# Patient Record
Sex: Male | Born: 1945
Health system: Southern US, Community
[De-identification: ages and names within clinical notes are randomized; demographics above are authoritative.]

## PROBLEM LIST (undated history)

## (undated) DIAGNOSIS — I10 Essential (primary) hypertension: Secondary | ICD-10-CM

## (undated) DIAGNOSIS — M1990 Unspecified osteoarthritis, unspecified site: Secondary | ICD-10-CM

## (undated) DIAGNOSIS — F4320 Adjustment disorder, unspecified: Secondary | ICD-10-CM

## (undated) DIAGNOSIS — E669 Obesity, unspecified: Secondary | ICD-10-CM

## (undated) DIAGNOSIS — I82409 Acute embolism and thrombosis of unspecified deep veins of unspecified lower extremity: Secondary | ICD-10-CM

## (undated) DIAGNOSIS — Z950 Presence of cardiac pacemaker: Secondary | ICD-10-CM

## (undated) DIAGNOSIS — F411 Generalized anxiety disorder: Secondary | ICD-10-CM

## (undated) DIAGNOSIS — K644 Residual hemorrhoidal skin tags: Secondary | ICD-10-CM

## (undated) DIAGNOSIS — R001 Bradycardia, unspecified: Secondary | ICD-10-CM

## (undated) DIAGNOSIS — I452 Bifascicular block: Secondary | ICD-10-CM

## (undated) HISTORY — DX: Bifascicular block: I45.2

## (undated) HISTORY — DX: Unspecified osteoarthritis, unspecified site: M19.90

## (undated) HISTORY — DX: Obesity, unspecified: E66.9

## (undated) HISTORY — DX: Essential (primary) hypertension: I10

## (undated) HISTORY — DX: Presence of cardiac pacemaker: Z95.0

## (undated) HISTORY — DX: Generalized anxiety disorder: F41.1

## (undated) HISTORY — DX: Bradycardia, unspecified: R00.1

## (undated) HISTORY — DX: Acute embolism and thrombosis of unspecified deep veins of unspecified lower extremity: I82.409

## (undated) HISTORY — DX: Adjustment disorder, unspecified: F43.20

## (undated) HISTORY — DX: Residual hemorrhoidal skin tags: K64.4

---

## 1998-09-16 ENCOUNTER — Encounter: Payer: Self-pay | Admitting: Emergency Medicine

## 1998-09-16 ENCOUNTER — Emergency Department (HOSPITAL_COMMUNITY): Admission: EM | Admit: 1998-09-16 | Discharge: 1998-09-16 | Payer: Self-pay | Admitting: Emergency Medicine

## 1998-10-24 ENCOUNTER — Encounter: Admission: RE | Admit: 1998-10-24 | Discharge: 1998-10-24 | Payer: Self-pay | Admitting: Sports Medicine

## 2000-04-22 ENCOUNTER — Encounter: Admission: RE | Admit: 2000-04-22 | Discharge: 2000-04-22 | Payer: Self-pay | Admitting: Family Medicine

## 2002-05-11 ENCOUNTER — Encounter: Admission: RE | Admit: 2002-05-11 | Discharge: 2002-05-11 | Payer: Self-pay | Admitting: Sports Medicine

## 2002-05-11 ENCOUNTER — Ambulatory Visit (HOSPITAL_COMMUNITY): Admission: RE | Admit: 2002-05-11 | Discharge: 2002-05-11 | Payer: Self-pay | Admitting: Family Medicine

## 2002-06-11 ENCOUNTER — Encounter: Admission: RE | Admit: 2002-06-11 | Discharge: 2002-06-11 | Payer: Self-pay | Admitting: Family Medicine

## 2002-09-20 ENCOUNTER — Encounter: Admission: RE | Admit: 2002-09-20 | Discharge: 2002-09-20 | Payer: Self-pay | Admitting: Family Medicine

## 2003-02-07 ENCOUNTER — Encounter: Admission: RE | Admit: 2003-02-07 | Discharge: 2003-02-07 | Payer: Self-pay | Admitting: Family Medicine

## 2003-08-19 ENCOUNTER — Encounter: Admission: RE | Admit: 2003-08-19 | Discharge: 2003-08-19 | Payer: Self-pay | Admitting: Family Medicine

## 2004-03-07 ENCOUNTER — Encounter: Admission: RE | Admit: 2004-03-07 | Discharge: 2004-03-07 | Payer: Self-pay | Admitting: Family Medicine

## 2004-03-29 ENCOUNTER — Emergency Department (HOSPITAL_COMMUNITY): Admission: EM | Admit: 2004-03-29 | Discharge: 2004-03-29 | Payer: Self-pay | Admitting: Emergency Medicine

## 2004-04-05 ENCOUNTER — Encounter: Admission: RE | Admit: 2004-04-05 | Discharge: 2004-04-05 | Payer: Self-pay | Admitting: Family Medicine

## 2004-06-05 ENCOUNTER — Ambulatory Visit: Payer: Self-pay | Admitting: Family Medicine

## 2004-07-30 ENCOUNTER — Ambulatory Visit: Payer: Self-pay | Admitting: Family Medicine

## 2004-09-06 ENCOUNTER — Ambulatory Visit: Payer: Self-pay | Admitting: Family Medicine

## 2005-04-03 ENCOUNTER — Ambulatory Visit: Payer: Self-pay | Admitting: Family Medicine

## 2005-04-25 ENCOUNTER — Ambulatory Visit: Payer: Self-pay | Admitting: Family Medicine

## 2005-10-11 ENCOUNTER — Ambulatory Visit: Payer: Self-pay | Admitting: Sports Medicine

## 2005-10-25 ENCOUNTER — Ambulatory Visit: Payer: Self-pay | Admitting: Family Medicine

## 2005-11-05 ENCOUNTER — Ambulatory Visit: Payer: Self-pay | Admitting: Family Medicine

## 2006-05-15 ENCOUNTER — Ambulatory Visit: Payer: Self-pay | Admitting: Family Medicine

## 2006-06-10 ENCOUNTER — Ambulatory Visit: Payer: Self-pay | Admitting: Family Medicine

## 2006-08-19 ENCOUNTER — Ambulatory Visit: Payer: Self-pay | Admitting: Family Medicine

## 2006-11-07 ENCOUNTER — Ambulatory Visit: Payer: Self-pay | Admitting: Family Medicine

## 2006-11-13 DIAGNOSIS — M1990 Unspecified osteoarthritis, unspecified site: Secondary | ICD-10-CM | POA: Insufficient documentation

## 2006-11-13 DIAGNOSIS — N529 Male erectile dysfunction, unspecified: Secondary | ICD-10-CM

## 2006-11-13 DIAGNOSIS — M109 Gout, unspecified: Secondary | ICD-10-CM | POA: Insufficient documentation

## 2006-11-13 DIAGNOSIS — I10 Essential (primary) hypertension: Secondary | ICD-10-CM

## 2006-11-13 DIAGNOSIS — E669 Obesity, unspecified: Secondary | ICD-10-CM

## 2006-11-13 DIAGNOSIS — F411 Generalized anxiety disorder: Secondary | ICD-10-CM | POA: Insufficient documentation

## 2006-11-17 ENCOUNTER — Emergency Department (HOSPITAL_COMMUNITY): Admission: EM | Admit: 2006-11-17 | Discharge: 2006-11-17 | Payer: Self-pay | Admitting: Emergency Medicine

## 2007-03-27 ENCOUNTER — Ambulatory Visit: Payer: Self-pay | Admitting: Family Medicine

## 2007-03-27 ENCOUNTER — Encounter (INDEPENDENT_AMBULATORY_CARE_PROVIDER_SITE_OTHER): Payer: Self-pay | Admitting: Family Medicine

## 2007-03-27 DIAGNOSIS — R011 Cardiac murmur, unspecified: Secondary | ICD-10-CM

## 2007-03-27 LAB — CONVERTED CEMR LAB
BUN: 16 mg/dL (ref 6–23)
CO2: 26 meq/L (ref 19–32)
Calcium: 9.3 mg/dL (ref 8.4–10.5)
Chloride: 107 meq/L (ref 96–112)
Creatinine, Ser: 1.39 mg/dL (ref 0.40–1.50)
Glucose, Bld: 112 mg/dL — ABNORMAL HIGH (ref 70–99)
Potassium: 3.7 meq/L (ref 3.5–5.3)
Sodium: 144 meq/L (ref 135–145)

## 2007-04-08 ENCOUNTER — Ambulatory Visit (HOSPITAL_COMMUNITY): Admission: RE | Admit: 2007-04-08 | Discharge: 2007-04-08 | Payer: Self-pay | Admitting: Family Medicine

## 2007-04-08 ENCOUNTER — Encounter: Payer: Self-pay | Admitting: Family Medicine

## 2007-04-08 ENCOUNTER — Ambulatory Visit: Payer: Self-pay | Admitting: Internal Medicine

## 2007-04-08 ENCOUNTER — Encounter (INDEPENDENT_AMBULATORY_CARE_PROVIDER_SITE_OTHER): Payer: Self-pay | Admitting: Family Medicine

## 2007-08-19 ENCOUNTER — Encounter (INDEPENDENT_AMBULATORY_CARE_PROVIDER_SITE_OTHER): Payer: Self-pay | Admitting: Family Medicine

## 2007-08-19 ENCOUNTER — Ambulatory Visit: Payer: Self-pay | Admitting: Family Medicine

## 2007-10-13 ENCOUNTER — Encounter: Payer: Self-pay | Admitting: *Deleted

## 2007-12-21 ENCOUNTER — Telehealth (INDEPENDENT_AMBULATORY_CARE_PROVIDER_SITE_OTHER): Payer: Self-pay | Admitting: Family Medicine

## 2008-06-29 ENCOUNTER — Encounter: Payer: Self-pay | Admitting: *Deleted

## 2008-07-26 ENCOUNTER — Encounter (INDEPENDENT_AMBULATORY_CARE_PROVIDER_SITE_OTHER): Payer: Self-pay | Admitting: Family Medicine

## 2008-07-26 ENCOUNTER — Ambulatory Visit: Payer: Self-pay | Admitting: Family Medicine

## 2008-07-27 ENCOUNTER — Telehealth (INDEPENDENT_AMBULATORY_CARE_PROVIDER_SITE_OTHER): Payer: Self-pay | Admitting: Family Medicine

## 2008-08-02 ENCOUNTER — Encounter (INDEPENDENT_AMBULATORY_CARE_PROVIDER_SITE_OTHER): Payer: Self-pay | Admitting: Family Medicine

## 2008-08-02 LAB — CONVERTED CEMR LAB
ALT: 13 units/L (ref 0–53)
AST: 16 units/L (ref 0–37)
Albumin: 4.5 g/dL (ref 3.5–5.2)
Alkaline Phosphatase: 51 units/L (ref 39–117)
HDL: 34 mg/dL — ABNORMAL LOW (ref >39)
LDL Cholesterol: 137 mg/dL — ABNORMAL HIGH (ref 0–99)
Potassium: 3.7 meq/L (ref 3.5–5.3)
Sodium: 142 meq/L (ref 135–145)
Total Protein: 8.2 g/dL (ref 6.0–8.3)

## 2009-01-18 ENCOUNTER — Encounter: Payer: Self-pay | Admitting: *Deleted

## 2009-01-23 ENCOUNTER — Emergency Department (HOSPITAL_COMMUNITY): Admission: EM | Admit: 2009-01-23 | Discharge: 2009-01-23 | Payer: Self-pay | Admitting: Emergency Medicine

## 2009-01-26 ENCOUNTER — Ambulatory Visit: Payer: Self-pay | Admitting: Family Medicine

## 2009-01-26 ENCOUNTER — Encounter (INDEPENDENT_AMBULATORY_CARE_PROVIDER_SITE_OTHER): Payer: Self-pay | Admitting: Family Medicine

## 2009-01-26 LAB — CONVERTED CEMR LAB
CO2: 29 meq/L (ref 19–32)
Calcium: 9 mg/dL (ref 8.4–10.5)
Chloride: 107 meq/L (ref 96–112)
Potassium: 4 meq/L (ref 3.5–5.3)
Sodium: 144 meq/L (ref 135–145)

## 2009-01-27 ENCOUNTER — Encounter (INDEPENDENT_AMBULATORY_CARE_PROVIDER_SITE_OTHER): Payer: Self-pay | Admitting: Family Medicine

## 2009-02-03 ENCOUNTER — Telehealth (INDEPENDENT_AMBULATORY_CARE_PROVIDER_SITE_OTHER): Payer: Self-pay | Admitting: Family Medicine

## 2009-02-07 ENCOUNTER — Telehealth: Payer: Self-pay | Admitting: *Deleted

## 2009-03-28 ENCOUNTER — Ambulatory Visit: Payer: Self-pay | Admitting: Family Medicine

## 2009-05-02 ENCOUNTER — Ambulatory Visit: Payer: Self-pay | Admitting: Family Medicine

## 2009-08-01 ENCOUNTER — Telehealth: Payer: Self-pay | Admitting: Family Medicine

## 2009-11-22 ENCOUNTER — Ambulatory Visit: Payer: Self-pay | Admitting: Family Medicine

## 2009-11-22 DIAGNOSIS — F4320 Adjustment disorder, unspecified: Secondary | ICD-10-CM | POA: Insufficient documentation

## 2010-01-29 ENCOUNTER — Telehealth: Payer: Self-pay | Admitting: Family Medicine

## 2010-01-30 ENCOUNTER — Ambulatory Visit: Payer: Self-pay | Admitting: Family Medicine

## 2010-02-19 ENCOUNTER — Ambulatory Visit: Payer: Self-pay | Admitting: Family Medicine

## 2010-02-19 ENCOUNTER — Encounter: Payer: Self-pay | Admitting: Family Medicine

## 2010-02-19 LAB — CONVERTED CEMR LAB
Cholesterol: 150 mg/dL (ref 0–200)
Glucose, Bld: 80 mg/dL (ref 70–99)
Potassium: 4.2 meq/L (ref 3.5–5.3)
Sodium: 142 meq/L (ref 135–145)
Total CHOL/HDL Ratio: 4.3
VLDL: 12 mg/dL (ref 0–40)

## 2010-02-22 ENCOUNTER — Encounter: Payer: Self-pay | Admitting: Family Medicine

## 2010-03-28 ENCOUNTER — Encounter: Payer: Self-pay | Admitting: Family Medicine

## 2010-04-18 ENCOUNTER — Telehealth: Payer: Self-pay | Admitting: Family Medicine

## 2010-04-19 ENCOUNTER — Ambulatory Visit: Payer: Self-pay | Admitting: Family Medicine

## 2010-04-19 DIAGNOSIS — K644 Residual hemorrhoidal skin tags: Secondary | ICD-10-CM | POA: Insufficient documentation

## 2010-07-20 ENCOUNTER — Encounter: Payer: Self-pay | Admitting: Family Medicine

## 2010-08-01 ENCOUNTER — Ambulatory Visit: Payer: Self-pay | Admitting: Family Medicine

## 2010-10-09 ENCOUNTER — Encounter (INDEPENDENT_AMBULATORY_CARE_PROVIDER_SITE_OTHER): Payer: Self-pay | Admitting: *Deleted

## 2010-10-16 NOTE — Letter (Signed)
Summary: Generic Letter  [facility]  [address]   [place], [place] [address]   Phone: [phone]  Fax: [phone]    02/22/2010  [facility] [address] [place], [place]  [address]  Dear [name],  Here is a copy of your lab results.  Your kidney function and cholesterol levels were fine.  No medications are needed at this point.  Tests: (1) Basic Metabolic Panel (57846)   Order Note: FASTING   Sodium                    142 mEq/L                   135-145   Potassium                 4.2 mEq/L                   3.5-5.3   Chloride                  106 mEq/L                   96-112   CO2                       27 mEq/L                    19-32   Glucose                   80 mg/dL                    96-29   BUN                       14 mg/dL                    5-28   Creatinine                1.15 mg/dL                  0.40-1.50   Calcium                   9.3 mg/dL                   4.1-32.4  Tests: (2) Lipid Profile (40102)   Cholesterol               150 mg/dL                   7-253     ATP III Classification:           < 200        mg/dL        Desirable          200 - 239     mg/dL        Borderline High          >= 240        mg/dL        High         Triglyceride              60 mg/dL                    <664  HDL Cholesterol      [L]  35 mg/dL                    >09   Total Chol/HDL Ratio      4.3 Ratio  VLDL Cholesterol (Calc)                             12 mg/dL                    8-11  LDL Cholesterol (Calc)                        [H]  103 mg/dL                   9-14           Total Cholesterol/HDL Ratio:CHD Risk                            Coronary Heart Disease Risk Table                                            Men       Women              1/2 Average Risk              3.4        3.3                  Average Risk              5.0        4.4              2 X Average Risk              9.6        7.1              3 X Average  Risk             23.4       11.0     Use the calculated Patient Ratio above and the CHD Risk table      to determine the patient's CHD Risk.     ATP III Classification (LDL):           < 100        mg/dL         Optimal          100 - 129     mg/dL         Near or Above Optimal          130 - 159     mg/dL         Borderline High          160 - 189     mg/dL         High           > 190        mg/dL         Very High  Sincerely,   Angelena Sole MD  Appended Document: Generic Letter mailed.

## 2010-10-16 NOTE — Assessment & Plan Note (Signed)
Summary: f/up,tcb   Vital Signs:  Patient profile:   65 year old male Height:      68.5 inches Weight:      198 pounds BMI:     29.78 BSA:     2.05 Temp:     98.8 degrees F Pulse rate:   58 / minute BP sitting:   149 / 78  Vitals Entered By: Jone Baseman CMA (November 22, 2009 1:49 PM)  Serial Vital Signs/Assessments:  Time      Position  BP       Pulse  Resp  Temp     By                     125/80                         Angelena Sole MD  CC: HTN Is Patient Diabetic? No Pain Assessment Patient in pain? no        CC:  HTN.  History of Present Illness: 1. HTN: Pt is taking his medicines as prescribed.  He does not check his blood pressure at home regularly.  Occassionally it will get checked and he says that it is always normal.        ROS:  He denies any chest pain, headache, shortness of breath  2. Adjustment disorder: Pt just found out that his brother died suddenly yesterday.  He was close with his brother.  Overall he doesn't feel depressed just trying to cope with the news.      ROS: denies SI  3. Gout:  Doing much better.  Staying away from cured meats and salty foods.      ROS: Denies joint swelling or redness.  Habits & Providers  Alcohol-Tobacco-Diet     Tobacco Status: never  Current Medications (verified): 1)  Cialis 20 Mg Tabs (Tadalafil) .... Take 1 Tab Daily As Needed 2)  Aspirin Ec 81 Mg Tbec (Aspirin) .... Take 1 Tablet By Mouth Once A Day 3)  Indomethacin 50 Mg Caps (Indomethacin) 4)  Lisinopril-Hydrochlorothiazide 20-12.5 Mg Tabs (Lisinopril-Hydrochlorothiazide) .Marland Kitchen.. 1 Tablet By Mouth Twice A Day 5)  Norvasc 10 Mg Tabs (Amlodipine Besylate) .... Take 1 Tablet By Mouth Once A Day 6)  Toprol Xl 50 Mg Xr24h-Tab (Metoprolol Succinate) .... One By Mouth Daily 7)  Colchicine 0.6 Mg Tabs (Colchicine) .... 2 Tablets On The Day of A Flare Then Two Times A Day Until Symptoms Resolve  Allergies: No Known Drug Allergies  Family History: Reviewed  history from 03/27/2007 and no changes required. Father died at 59 unk causes Brother died from PE? in 01/31/2010 HTN runs in family Mother- arrythmia with pacemaker, living at 57 No DM, CVA,  prostate cancer  Social History: Reviewed history from 01/26/2009 and no changes required. Married, 5 grown children (one boy was in the army, 4 girls), 11 grandchildren.  Works at [employer] - laid off as of Jan 2009.  Denies tobacco or illicit drugs ever.  Rare alcohol usage.  Enjoys fishing. Runs the step in his house with his grandson.   Physical Exam  General:  vitals reviewed and rechecked.  alert, well-nourished, and well-hydrated.   Eyes:  Fundoscopic exam benign Neck:  No JVD Lungs:  normal respiratory effort, normal breath sounds, and no wheezes.   Heart:  normal rate, regular rhythm, and no murmur.   Extremities:  no lower extremity edema Psych:  Mildly depressed appearing.  Good eye contact.  Full affect.   Impression & Recommendations:  Problem # 1:  HYPERTENSION, BENIGN SYSTEMIC (ICD-401.1) Assessment Unchanged  At goal with recheck.  Will not make any changes at this time.  At next visit will check lipids and BMET. His updated medication list for this problem includes:    Lisinopril-hydrochlorothiazide 20-12.5 Mg Tabs (Lisinopril-hydrochlorothiazide) .Marland Kitchen... 1 tablet by mouth twice a day    Norvasc 10 Mg Tabs (Amlodipine besylate) .Marland Kitchen... Take 1 tablet by mouth once a day    Toprol Xl 50 Mg Xr24h-tab (Metoprolol succinate) ..... One by mouth daily  Orders: [facility]- Est  Level 4 (99214)  Problem # 2:  ADJUSTMENT DISORDER WITHOUT DEPRESSED MOOD (ICD-309.9) Assessment: New  Does not seem depressed.  Thinks that he just needs some time to adjust.  Orders: [facility]- Est  Level 4 (81191)  Problem # 3:  GOUT, UNSPECIFIED (ICD-274.9) Assessment: Improved  His updated medication list for this problem includes:    Indomethacin 50 Mg Caps (Indomethacin)    Colchicine 0.6 Mg Tabs (Colchicine)  .Marland Kitchen... 2 tablets on the day of a flare then two times a day until symptoms resolve  Orders: [facility]- Est  Level 4 (47829)  Complete Medication List: 1)  Cialis 20 Mg Tabs (Tadalafil) .... Take 1 tab daily as needed 2)  Aspirin Ec 81 Mg Tbec (Aspirin) .... Take 1 tablet by mouth once a day 3)  Indomethacin 50 Mg Caps (Indomethacin) 4)  Lisinopril-hydrochlorothiazide 20-12.5 Mg Tabs (Lisinopril-hydrochlorothiazide) .Marland Kitchen.. 1 tablet by mouth twice a day 5)  Norvasc 10 Mg Tabs (Amlodipine besylate) .... Take 1 tablet by mouth once a day 6)  Toprol Xl 50 Mg Xr24h-tab (Metoprolol succinate) .... One by mouth daily 7)  Colchicine 0.6 Mg Tabs (Colchicine) .... 2 tablets on the day of a flare then two times a day until symptoms resolve  Other Orders: Pneumococcal Vaccine (56213) Admin 1st Vaccine (08657) Admin 1st Vaccine [facility]) [phone])  Patient Instructions: 1)  I'm sorry for your loss 2)  I think that you are otherwise doing great 3)  Your blood pressure was better after I rechecked it 4)  I would like to get some blood work with your next visit.  Including your cholesterol and kidney function. 5)  Please schedule a follow up visit in 3 months.  Don't eat anything for 8 hours prior to your visit so we can get the blood work done. 6)  Please let me know if you start feeling depressed and need to talk with someone  Prevention & Chronic Care Immunizations   Influenza vaccine: clinic ran out  (07/26/2008)   Influenza vaccine due: 07/26/2009    Tetanus booster: 10/17/1998: Done.   Tetanus booster due: 10/17/2008    Pneumococcal vaccine: Pneumovax  (11/22/2009)   Pneumococcal vaccine due: None    H. zoster vaccine: Not documented  Colorectal Screening   Hemoccult: Done.  (05/21/2004)   Hemoccult due: Not Indicated    Colonoscopy: Done.  (07/22/2005)   Colonoscopy due: 07/23/2015  Other Screening   PSA: 0.78  (01/26/2009)   Smoking status: never  (11/22/2009)  Lipids   Total  Cholesterol: 191  (07/26/2008)   LDL: 137  (07/26/2008)   LDL Direct: 103  (01/26/2009)   HDL: 34  (07/26/2008)   Triglycerides: 101  (07/26/2008)  Hypertension   Last Blood Pressure: 149 / 78  (11/22/2009)   Serum creatinine: 1.36  (01/26/2009)   Serum potassium 4.0  (01/26/2009)    Hypertension flowsheet reviewed?: Yes  Progress toward BP goal: Unchanged  Self-Management Support :    Hypertension self-management support: Not documented   Nursing Instructions: Give Pneumovax today    Pneumovax Vaccine    Vaccine Type: Pneumovax    Site: left deltoid    Mfr: Merck    Dose: 0.5 ml    Route: IM    Given by: Garen Grams LPN    Exp. Date: 04/30/2011    Lot #: [id number]    VIS given: 04/13/96 version given November 22, 2009.

## 2010-10-16 NOTE — Progress Notes (Signed)
Summary: triage  Phone Note Call from Patient Call back at Home Phone [phone]   Caller: Patient Summary of Call: needs to talk to nurse about hemmorhoids Initial call taken by: De Nurse,  April 18, 2010 3:03 PM  Follow-up for Phone Call        says he has soft bm & is using the prep H but they are hurting. appt with pcp made for tomorrow Follow-up by: Golden Circle RN,  April 18, 2010 3:06 PM

## 2010-10-16 NOTE — Assessment & Plan Note (Signed)
Summary: f/u,df   Vital Signs:  Patient profile:   65 year old male Height:      68.5 inches Weight:      193 pounds BMI:     29.02 Temp:     98.9 degrees F oral Pulse rate:   54 / minute BP sitting:   119 / 73  (left arm) Cuff size:   regular  Vitals Entered By: Garen Grams LPN (August 01, 2010 3:57 PM) CC: f/u bp Is Patient Diabetic? No Pain Assessment Patient in pain? no        Primary Care Provider:  Angelena Sole MD  CC:  f/u bp.  History of Present Illness: 1. HTN:  Pt is taking his blood pressure medicines as prescribed.  He doesn't check his blood pressure at home regularly.  ROS: denies chest pain, shortness of breath, vision changes  2. Hemorrhoids: Are not bothering him anymore  3. Gout:  Not taking any preventive medications.  Hasn't had any joint pain in months.  Habits & Providers  Alcohol-Tobacco-Diet     Tobacco Status: never  Current Medications (verified): 1)  Cialis 20 Mg Tabs (Tadalafil) .... Take 1 Tab Daily As Needed 2)  Aspirin Ec 81 Mg Tbec (Aspirin) .... Take 1 Tablet By Mouth Once A Day 3)  Lisinopril-Hydrochlorothiazide 20-12.5 Mg Tabs (Lisinopril-Hydrochlorothiazide) .Marland Kitchen.. 1 Tablet By Mouth Twice A Day 4)  Norvasc 10 Mg Tabs (Amlodipine Besylate) .... Take 1 Tablet By Mouth Once A Day 5)  Toprol Xl 50 Mg Xr24h-Tab (Metoprolol Succinate) .... One By Mouth Daily 6)  Colcrys 0.6 Mg Tabs (Colchicine) .... Take Two Tabs By Mouth Two Times A Day For Gout Flare  Allergies: No Known Drug Allergies  Past History:  Past Medical History: Reviewed history from 08/19/2007 and no changes required. FOB negative 11/14 2-D Echo - Mod LVH, EF 75% - 10/18/1995, normal EF 03/2007 with continued mild LVH ETT - elev. BP & equivocal ST changes - 10/18/1995 ANA negative - 10/17/1998, ESR 8 - 10/17/1998, RA <20 - 10/17/1998, uric acid 6.3 - 10/17/1998  Social History: Reviewed history from 01/26/2009 and no changes required. Married, 5 grown children (one boy  was in the army, 4 girls), 11 grandchildren.  Works at [employer] - laid off as of Jan 2009.  Denies tobacco or illicit drugs ever.  Rare alcohol usage.  Enjoys fishing. Runs the step in his house with his grandson.   Physical Exam  General:  vitals reviewed.  no acute distress Neck:  supple.  no JVD Lungs:  normal respiratory effort, normal breath sounds, and no wheezes.   Heart:  normal rate, regular rhythm, and no murmur.   Msk:  no joint tenderness and no joint swelling.   Extremities:  no LE edema Neurologic:  alert & oriented X3.   Psych:  not anxious appearing and not depressed appearing.     Impression & Recommendations:  Problem # 1:  HYPERTENSION, BENIGN SYSTEMIC (ICD-401.1) Assessment Unchanged  At goal.  Continue with current medications. His updated medication list for this problem includes:    Lisinopril-hydrochlorothiazide 20-12.5 Mg Tabs (Lisinopril-hydrochlorothiazide) .Marland Kitchen... 1 tablet by mouth twice a day    Norvasc 10 Mg Tabs (Amlodipine besylate) .Marland Kitchen... Take 1 tablet by mouth once a day    Toprol Xl 50 Mg Xr24h-tab (Metoprolol succinate) ..... One by mouth daily  Orders: [facility]- Est  Level 4 (04540)  Problem # 2:  EXTERNAL HEMORRHOIDS (ICD-455.3) Assessment: Improved  Resolved.   Orders: [facility]- Est  Level 4 (99214)  Problem # 3:  GOUT, UNSPECIFIED (ICD-274.9) Assessment: Improved  Doing well.  No active joints.  Continue Colcrys as needed. The following medications were removed from the medication list:    Indomethacin 50 Mg Caps (Indomethacin) His updated medication list for this problem includes:    Colcrys 0.6 Mg Tabs (Colchicine) .Marland Kitchen... Take two tabs by mouth two times a day for gout flare  Orders: [facility]- Est  Level 4 (19147)  Complete Medication List: 1)  Cialis 20 Mg Tabs (Tadalafil) .... Take 1 tab daily as needed 2)  Aspirin Ec 81 Mg Tbec (Aspirin) .... Take 1 tablet by mouth once a day 3)  Lisinopril-hydrochlorothiazide 20-12.5 Mg Tabs  (Lisinopril-hydrochlorothiazide) .Marland Kitchen.. 1 tablet by mouth twice a day 4)  Norvasc 10 Mg Tabs (Amlodipine besylate) .... Take 1 tablet by mouth once a day 5)  Toprol Xl 50 Mg Xr24h-tab (Metoprolol succinate) .... One by mouth daily 6)  Colcrys 0.6 Mg Tabs (Colchicine) .... Take two tabs by mouth two times a day for gout flare  Patient Instructions: 1)  I'm glad that you are doing well 2)  Keep up the good work with the blood pressure 3)  Please schedule a follow up appointment with me in 6 months   Orders Added: 1)  [facility]- Est  Level 4 [82956]  Appended Document: f/u,df    Clinical Lists Changes  Orders: Added new Service order of Flu Vaccine 29yrs + [phone]) - Signed Added new Service order of Admin 1st Vaccine (65784) - Signed Observations: Added new observation of FLU VAX#1VIS: 04/10/10 version given August 02, 2010. (08/01/2010 17:16) Added new observation of FLU VAXLOT: [id number] (08/01/2010 17:16) Added new observation of FLU VAX EXP: 03/16/2011 (08/01/2010 17:16) Added new observation of FLU VAXBY: Jessica Fleeger CMA (08/01/2010 17:16) Added new observation of FLU VAXRTE: IM (08/01/2010 17:16) Added new observation of FLU VAX DSE: 0.5 ml (08/01/2010 17:16) Added new observation of FLU VAXMFR: GlaxoSmithKline (08/01/2010 17:16) Added new observation of FLU VAX SITE: left deltoid (08/01/2010 17:16) Added new observation of FLU VAX: Fluvax 3+ (08/01/2010 17:16)       Immunizations Administered:  Influenza Vaccine # 1:    Vaccine Type: Fluvax 3+    Site: left deltoid    Mfr: GlaxoSmithKline    Dose: 0.5 ml    Route: IM    Given by: Jone Baseman CMA    Exp. Date: 03/16/2011    Lot #: [id number]    VIS given: 04/10/10 version given August 02, 2010.  Flu Vaccine Consent Questions:    Do you have a history of severe allergic reactions to this vaccine? no    Any prior history of allergic reactions to egg and/or gelatin? no    Do you have a sensitivity to the  preservative Thimersol? no    Do you have a past history of Guillan-Barre Syndrome? no    Do you currently have an acute febrile illness? no    Have you ever had a severe reaction to latex? no    Vaccine information given and explained to patient? yes

## 2010-10-16 NOTE — Miscellaneous (Signed)
  Clinical Lists Changes  Medications: Added new medication of COLCRYS 0.6 MG TABS (COLCHICINE) Take two tabs by mouth two times a day for gout flare Removed medication of COLCHICINE 0.6 MG TABS (COLCHICINE) 2 tablets on the day of a flare then two times a day until symptoms resolve

## 2010-10-16 NOTE — Progress Notes (Signed)
Summary: triage  Phone Note Call from Patient Call back at [phone]   Caller: Patient Summary of Call: having hemmorrhoid problems and wants to know what to do Initial call taken by: De Nurse,  Jan 29, 2010 2:55 PM  Follow-up for Phone Call        used prep H which helped some. denies constipation. wants to be seen. appt tomorrow at 11 work in. aware there may be a wait. this was the best time for him Follow-up by: Golden Circle RN,  Jan 29, 2010 3:05 PM

## 2010-10-16 NOTE — Assessment & Plan Note (Signed)
Summary: hemmorhoids/Mayo/saunders   Vital Signs:  Patient profile:   65 year old male Height:      68.5 inches Weight:      193.2 pounds BMI:     29.05 Temp:     98.6 degrees F oral Pulse rate:   110 / minute BP sitting:   164 / 79  (left arm) Cuff size:   regular  Vitals Entered By: Garen Grams LPN (Jan 30, 2010 11:05 AM) CC: hemmorhoids Is Patient Diabetic? No Pain Assessment Patient in pain? no        Primary Care Provider:  Angelena Sole MD  CC:  hemmorhoids.  History of Present Illness: 65 yo male with 1 week of anal pruritus--worse with stooling.  No prior episode of this.  Denies blood in stool, constipation, diarrhea.  Reports black stools once a month after he takes Ex-Lax.  He does not get constipated, but takes Ex-Lax once a month for a clean out as recommended by his mother--this is the only time he has black stools.  Was advised by family member to get some Preparation H, but he is not sure what that is and has usedno OTC medications for this.  Noted to be hypertensive today.  Taking medications as prescribed, though he cannot name them today.  He denies chest pain, dyspnea, LE edema.  Tobacco status noted.  Habits & Providers  Alcohol-Tobacco-Diet     Tobacco Status: never  Allergies (verified): No Known Drug Allergies  Review of Systems       Per HPI.  Physical Exam  Additional Exam:  VITALS:  Reviewed, hypertensive GEN: Alert & oriented, no acute distress CARDIO: Regular rate and rhythm, no murmurs/rubs/gallops, 2+ bilateral radial pulses RESP: Clear to auscultation, normal work of breathing, no retractions/accessory muscle use ABD: Normoactive bowel sounds, nontender, no masses/hepatosplenomegaly EXT: Nontender, no edema RECTUM:  Skin tag, no external hemorrhoid or fissure noted on external exam, DRE limited by pain--rectum not examined.  Unable to perform FOBT.   Impression & Recommendations:  Problem # 1:  ANAL OR RECTAL PAIN  (ZOX-096.04) Assessment New Symptoms consistent with hemorrhoids.  Intermittent black stools noted, though patient states this is chronic and only when taking Ex-Lax--would attempt to repeat FOBT at next exam.  Will treat presumptively with Preparation H + Hydrocortisone, sitz baths.  Follow up in 1-2 weeks.  Orders: [facility]- Est  Level 4 (54098)  Problem # 2:  HYPERTENSION, BENIGN SYSTEMIC (ICD-401.1) Assessment: Deteriorated  Asymptomatic.  Possibly secondary to anal pain.  Advised f/u with PCP 1-2 weeks. His updated medication list for this problem includes:    Lisinopril-hydrochlorothiazide 20-12.5 Mg Tabs (Lisinopril-hydrochlorothiazide) .Marland Kitchen... 1 tablet by mouth twice a day    Norvasc 10 Mg Tabs (Amlodipine besylate) .Marland Kitchen... Take 1 tablet by mouth once a day    Toprol Xl 50 Mg Xr24h-tab (Metoprolol succinate) ..... One by mouth daily  Orders: [facility]- Est  Level 4 (99214)  Complete Medication List: 1)  Cialis 20 Mg Tabs (Tadalafil) .... Take 1 tab daily as needed 2)  Aspirin Ec 81 Mg Tbec (Aspirin) .... Take 1 tablet by mouth once a day 3)  Indomethacin 50 Mg Caps (Indomethacin) 4)  Lisinopril-hydrochlorothiazide 20-12.5 Mg Tabs (Lisinopril-hydrochlorothiazide) .Marland Kitchen.. 1 tablet by mouth twice a day 5)  Norvasc 10 Mg Tabs (Amlodipine besylate) .... Take 1 tablet by mouth once a day 6)  Toprol Xl 50 Mg Xr24h-tab (Metoprolol succinate) .... One by mouth daily 7)  Colchicine 0.6 Mg Tabs (Colchicine) .... 2  tablets on the day of a flare then two times a day until symptoms resolve 8)  Preparation H Hydrocortisone 1 % Crea (Hydrocortisone) .... Apply to anus two times a day for hemorrhoid pain  Patient Instructions: 1)  Pleasure to meet you today. 2)  I have sent a prescription for Preparation H/Hydrocortisone Cream to the pharmacy.  Use as directed. 3)  You can also use a Sitz bath--that is, sit in lukewarm water once daily. 4)  Your blood pressure is higher than usual today, I want you to seen Dr.  Lelon Perla in 1-2 weeks for this. 5)  Please follow up with Dr. Lelon Perla for a recheck in 1-2 weeks. Prescriptions: PREPARATION H HYDROCORTISONE 1 % CREA (HYDROCORTISONE) Apply to anus two times a day for hemorrhoid pain  #1 x 0   Entered and Authorized by:   Romero Belling MD   Signed by:   Romero Belling MD on 01/30/2010   Method used:   Electronically to        [facility] (retail)       [address]       [place], [place]  [address]       Ph: [phone]       Fax: [phone]   RxID:   [id number]

## 2010-10-16 NOTE — Assessment & Plan Note (Signed)
Summary: f/u,df   Vital Signs:  Patient profile:   65 year old male Height:      68.5 inches Weight:      195 pounds BMI:     29.32 BSA:     2.03 Temp:     98.4 degrees F Pulse rate:   58 / minute BP sitting:   160 / 90  (left arm)  Vitals Entered By: Jone Baseman CMA (February 19, 2010 1:38 PM) CC: f/u HTN Is Patient Diabetic? No Pain Assessment Patient in pain? no        Primary Care Provider:  Angelena Sole MD  CC:  f/u HTN.  History of Present Illness: 1. HTN:  He has not been taking his medicines for the past 4 days.  He ran out of his prescriptions and hasn't had them filled yet.  He doesn't check his blood pressure at home regularly.      ROS: denies chest pain, shortness of breath, headache  2. Gout:  Hasn't had any flares since last time that he was in clinic.  Only takes the Colchicine as needed, doesn't take a preventive medication.  Habits & Providers  Alcohol-Tobacco-Diet     Tobacco Status: never  Current Medications (verified): 1)  Cialis 20 Mg Tabs (Tadalafil) .... Take 1 Tab Daily As Needed 2)  Aspirin Ec 81 Mg Tbec (Aspirin) .... Take 1 Tablet By Mouth Once A Day 3)  Indomethacin 50 Mg Caps (Indomethacin) 4)  Lisinopril-Hydrochlorothiazide 20-12.5 Mg Tabs (Lisinopril-Hydrochlorothiazide) .Marland Kitchen.. 1 Tablet By Mouth Twice A Day 5)  Norvasc 10 Mg Tabs (Amlodipine Besylate) .... Take 1 Tablet By Mouth Once A Day 6)  Toprol Xl 50 Mg Xr24h-Tab (Metoprolol Succinate) .... One By Mouth Daily 7)  Colchicine 0.6 Mg Tabs (Colchicine) .... 2 Tablets On The Day of A Flare Then Two Times A Day Until Symptoms Resolve 8)  Preparation H Hydrocortisone 1 % Crea (Hydrocortisone) .... Apply To Anus Two Times A Day For Hemorrhoid Pain  Allergies: No Known Drug Allergies  Social History: Reviewed history from 01/26/2009 and no changes required. Married, 5 grown children (one boy was in the army, 4 girls), 11 grandchildren.  Works at [employer] - laid off as of Jan 2009.   Denies tobacco or illicit drugs ever.  Rare alcohol usage.  Enjoys fishing. Runs the step in his house with his grandson.   Physical Exam  General:  vitals reviewed and rechecked.  alert, well-nourished, and well-hydrated.   Head:  normocephalic and atraumatic.   Eyes:  Fundoscopic exam benign Neck:  No JVD Lungs:  normal respiratory effort, normal breath sounds, and no wheezes.   Heart:  normal rate, regular rhythm, and no murmur.   Abdomen:  soft, non-tender, and no distention.   Extremities:  no lower extremity edema Psych:  Good eye contact.  Full affect.   Impression & Recommendations:  Problem # 1:  HYPERTENSION, BENIGN SYSTEMIC (ICD-401.1) Assessment Deteriorated Sent in refills for his medications.  Encouraged him to take them everyday.  Will have him come back in 4-6 weeks for a nurse blood pressure check. His updated medication list for this problem includes:    Lisinopril-hydrochlorothiazide 20-12.5 Mg Tabs (Lisinopril-hydrochlorothiazide) .Marland Kitchen... 1 tablet by mouth twice a day    Norvasc 10 Mg Tabs (Amlodipine besylate) .Marland Kitchen... Take 1 tablet by mouth once a day    Toprol Xl 50 Mg Xr24h-tab (Metoprolol succinate) ..... One by mouth daily  Orders: T-Basic Metabolic Panel [id number]) T-Lipid Profile [phone])  [facility]- Est Level  3 ([address])  Problem # 2:  GOUT, UNSPECIFIED (ICD-274.9) Assessment: Unchanged  His updated medication list for this problem includes:    Indomethacin 50 Mg Caps (Indomethacin)    Colchicine 0.6 Mg Tabs (Colchicine) .Marland Kitchen... 2 tablets on the day of a flare then two times a day until symptoms resolve  Orders: [facility]- Est Level  3 ([address])  Complete Medication List: 1)  Cialis 20 Mg Tabs (Tadalafil) .... Take 1 tab daily as needed 2)  Aspirin Ec 81 Mg Tbec (Aspirin) .... Take 1 tablet by mouth once a day 3)  Indomethacin 50 Mg Caps (Indomethacin) 4)  Lisinopril-hydrochlorothiazide 20-12.5 Mg Tabs (Lisinopril-hydrochlorothiazide) .Marland Kitchen.. 1 tablet by mouth  twice a day 5)  Norvasc 10 Mg Tabs (Amlodipine besylate) .... Take 1 tablet by mouth once a day 6)  Toprol Xl 50 Mg Xr24h-tab (Metoprolol succinate) .... One by mouth daily 7)  Colchicine 0.6 Mg Tabs (Colchicine) .... 2 tablets on the day of a flare then two times a day until symptoms resolve 8)  Preparation H Hydrocortisone 1 % Crea (Hydrocortisone) .... Apply to anus two times a day for hemorrhoid pain  Patient Instructions: 1)  I have sent in a refill for your blood pressure medicines 2)  Please let us know when you are running low so that we can send them in before you run out 3)  We will get some blood work today and I'll let you know the results 4)  Please come back in 4-6 weeks for a nurse visit to recheck your blood pressure Prescriptions: TOPROL XL 50 MG XR24H-TAB (METOPROLOL SUCCINATE) one by mouth daily  #30 x 3   Entered and Authorized by:   Angelena Sole MD   Signed by:   Angelena Sole MD on 02/19/2010   Method used:   Electronically to        [facility] (retail)       [address]       [place], [place]  [address]       Ph: [phone]       Fax: [phone]   RxID:   [id number] NORVASC 10 MG TABS (AMLODIPINE BESYLATE) Take 1 tablet by mouth once a day  #30 x 3   Entered and Authorized by:   Angelena Sole MD   Signed by:   Angelena Sole MD on 02/19/2010   Method used:   Electronically to        [facility] (retail)       [address]       [place], [place]  [address]       Ph: [phone]       Fax: [phone]   RxID:   [id number] LISINOPRIL-HYDROCHLOROTHIAZIDE 20-12.5 MG TABS (LISINOPRIL-HYDROCHLOROTHIAZIDE) 1 tablet by mouth twice a day  #60 x 6   Entered and Authorized by:   Angelena Sole MD   Signed by:   Angelena Sole MD on 02/19/2010   Method used:   Electronically to        [facility] (retail)       [address]       [place],  [place]  [address]       Ph: [phone]       Fax: [phone]   RxID:   [id number]   Prevention &  Chronic Care Immunizations   Influenza vaccine: clinic ran out  (07/26/2008)   Influenza vaccine due: 07/26/2009    Tetanus booster: 10/17/1998: Done.   Tetanus booster due: 10/17/2008    Pneumococcal vaccine: Pneumovax  (11/22/2009)   Pneumococcal vaccine due: None    H. zoster vaccine: Not documented  Colorectal Screening   Hemoccult: Done.  (05/21/2004)   Hemoccult due: Not Indicated    Colonoscopy: Done.  (07/22/2005)   Colonoscopy due: 07/23/2015  Other Screening   PSA: 0.78  (01/26/2009)   Smoking status: never  (02/19/2010)  Lipids   Total Cholesterol: 191  (07/26/2008)   Lipid panel action/deferral: Lipid Panel ordered   LDL: 137  (07/26/2008)   LDL Direct: 103  (01/26/2009)   HDL: 34  (07/26/2008)   Triglycerides: 101  (07/26/2008)  Hypertension   Last Blood Pressure: 160 / 90  (02/19/2010)   Serum creatinine: 1.36  (01/26/2009)   BMP action: Ordered   Serum potassium 4.0  (01/26/2009)    Hypertension flowsheet reviewed?: Yes   Progress toward BP goal: Unchanged  Self-Management Support :   Personal Goals (by the next clinic visit) :      Personal blood pressure goal: 140/90  (02/19/2010)   Hypertension self-management support: Not documented   Nursing Instructions: Give tetanus booster today Give Herpes zoster vaccine today    Appended Document: f/u,df   Immunizations Administered:  Tetanus Vaccine:    Vaccine Type: Tdap    Site: right deltoid    Mfr: GlaxoSmithKline    Dose: 0.5 ml    Route: IM    Given by: Jone Baseman CMA    Exp. Date: 12/09/2011    Lot #: [id number]    VIS given: 08/04/07 version given February 19, 2010.

## 2010-10-16 NOTE — Assessment & Plan Note (Signed)
Summary: hemmorhoids/ac   Vital Signs:  Patient profile:   65 year old male Height:      68.5 inches Weight:      187 pounds BMI:     28.12 BSA:     2.00 Temp:     98.4 degrees F Pulse rate:   50 / minute BP sitting:   152 / 76  Vitals Entered By: Jone Baseman CMA (April 19, 2010 3:34 PM) CC: hemmoroids Is Patient Diabetic? No Pain Assessment Patient in pain? no        Primary Care Provider:  Angelena Sole MD  CC:  hemmoroids.  History of Present Illness: 1. Hemorrhoids: - Has had anal burning and itching with BM - No rectal bleeding - Has been using Preparation H every once in a while - No sitz baths - Is not constipated  Habits & Providers  Alcohol-Tobacco-Diet     Tobacco Status: never  Current Medications (verified): 1)  Cialis 20 Mg Tabs (Tadalafil) .... Take 1 Tab Daily As Needed 2)  Aspirin Ec 81 Mg Tbec (Aspirin) .... Take 1 Tablet By Mouth Once A Day 3)  Indomethacin 50 Mg Caps (Indomethacin) 4)  Lisinopril-Hydrochlorothiazide 20-12.5 Mg Tabs (Lisinopril-Hydrochlorothiazide) .Marland Kitchen.. 1 Tablet By Mouth Twice A Day 5)  Norvasc 10 Mg Tabs (Amlodipine Besylate) .... Take 1 Tablet By Mouth Once A Day 6)  Toprol Xl 50 Mg Xr24h-Tab (Metoprolol Succinate) .... One By Mouth Daily 7)  Preparation H Hydrocortisone 1 % Crea (Hydrocortisone) .... Apply To Anus Two Times A Day For Hemorrhoid Pain 8)  Colcrys 0.6 Mg Tabs (Colchicine) .... Take Two Tabs By Mouth Two Times A Day For Gout Flare  Allergies: No Known Drug Allergies  Physical Exam  General:  vitals reviewed.  no acute distress Lungs:  normal respiratory effort, normal breath sounds, and no wheezes.   Heart:  normal rate, regular rhythm, and no murmur.   Rectal:  external hemorrhoid(s).  None thrombosed.  Not painful.   Impression & Recommendations:  Problem # 1:  EXTERNAL HEMORRHOIDS (ICD-455.3) Assessment New  Uncomplicated.  Prepartion H with HC for 1 week.  Advised sitz baths 3 times per  day.  Also encouarged fiber to soften stool.  Orders: [facility]- Est Level  3 ([address])  Complete Medication List: 1)  Cialis 20 Mg Tabs (Tadalafil) .... Take 1 tab daily as needed 2)  Aspirin Ec 81 Mg Tbec (Aspirin) .... Take 1 tablet by mouth once a day 3)  Indomethacin 50 Mg Caps (Indomethacin) 4)  Lisinopril-hydrochlorothiazide 20-12.5 Mg Tabs (Lisinopril-hydrochlorothiazide) .Marland Kitchen.. 1 tablet by mouth twice a day 5)  Norvasc 10 Mg Tabs (Amlodipine besylate) .... Take 1 tablet by mouth once a day 6)  Toprol Xl 50 Mg Xr24h-tab (Metoprolol succinate) .... One by mouth daily 7)  Preparation H Hydrocortisone 1 % Crea (Hydrocortisone) .... Apply to anus two times a day for hemorrhoid pain 8)  Colcrys 0.6 Mg Tabs (Colchicine) .... Take two tabs by mouth two times a day for gout flare  Patient Instructions: 1)  Use the preparation H twice a day for 7 day

## 2010-10-16 NOTE — Miscellaneous (Signed)
  Clinical Lists Changes  Problems: Removed problem of SPECIAL SCREENING MALIGNANT NEOPLASM OF PROSTATE (ICD-V76.44)

## 2010-10-18 NOTE — Letter (Signed)
Summary: Generic Letter  [facility]  [address]   [place], [place] [address]   Phone: [phone]  Fax: [phone]    10/09/2010  [address] [place], [place]  [address]  Dear Mr. Fortson,  We are happy to let you know that since you are covered under Medicare you are able to have a FREE visit at the [facility] to discuss your HEALTH. This is a new benefit for Medicare.  There will be no co-payment.  At this visit you will meet with Arlys John an expert in wellness and the health coach at our clinic.  At this visit we will discuss ways to keep you healthy and feeling well.  This visit will not replace your regular doctor visit and we cannot refill medications.     You will need to plan to be here at least one hour to talk about your medical history, your current status, review all of your medications, and discuss your future plans for your health.  This information will be entered into your record for your doctor to have and review.  If you are interested in staying healthy, this type of visit can help.  Please call the office at: [phone], to schedule a "Medicare Wellness Visit".  The day of the visit you should bring in all of your medications, including any vitamins, herbs, over the counter products you take.  Make a list of all the other doctors that you see, so we know who they are. If you have any other health documents please bring them.  We look forward to helping you stay healthy.  Sincerely,   [facility]  [email or web address]

## 2010-12-04 ENCOUNTER — Ambulatory Visit (INDEPENDENT_AMBULATORY_CARE_PROVIDER_SITE_OTHER): Payer: No Typology Code available for payment source | Admitting: Family Medicine

## 2010-12-04 ENCOUNTER — Encounter: Payer: Self-pay | Admitting: Family Medicine

## 2010-12-04 DIAGNOSIS — I10 Essential (primary) hypertension: Secondary | ICD-10-CM

## 2010-12-04 NOTE — Assessment & Plan Note (Signed)
At goal.  No changes today.  

## 2010-12-04 NOTE — Patient Instructions (Signed)
We will not make any changes today We will have our health coach call you back to schedule an appointment with her Please schedule a follow up appointment in 6 months

## 2010-12-04 NOTE — Progress Notes (Signed)
  Subjective:    Patient ID: Robert Flynn, male    DOB: 1945/11/30, 65 y.o.   MRN: [id number]  HPI  1. HTN:  Pt is taking his medications as prescribed.  He doesn't check his blood pressure regularly.  Review of Systems Denies chest pain, shortness of breath, leg swelling    Objective:   Physical Exam  Constitutional: He appears well-nourished. No distress.  Eyes:       Normal fundoscopic exam  Neck: Normal range of motion. Neck supple.  Cardiovascular: Normal rate and regular rhythm.   Pulmonary/Chest: Effort normal and breath sounds normal. No respiratory distress. He has no wheezes.  Abdominal: Soft.  Musculoskeletal: He exhibits no edema.  Skin: Skin is warm and dry.  Psychiatric: He has a normal mood and affect.          Assessment & Plan:

## 2010-12-12 ENCOUNTER — Ambulatory Visit (INDEPENDENT_AMBULATORY_CARE_PROVIDER_SITE_OTHER): Payer: No Typology Code available for payment source | Admitting: Home Health Services

## 2010-12-12 ENCOUNTER — Encounter: Payer: Self-pay | Admitting: Home Health Services

## 2010-12-12 VITALS — BP 153/74 | HR 47 | Temp 98.5°F | Ht 70.0 in | Wt 199.0 lb

## 2010-12-12 DIAGNOSIS — Z0000 Encounter for general adult medical examination without abnormal findings: Secondary | ICD-10-CM

## 2010-12-12 MED ORDER — LISINOPRIL-HYDROCHLOROTHIAZIDE 20-12.5 MG PO TABS: 1.0000 | ORAL_TABLET | Freq: Two times a day (BID) | ORAL | Status: DC

## 2010-12-12 NOTE — Patient Instructions (Signed)
1. Eat 3-4 vegetables a day. 2. Work out at least 3 times a week for 30 minutes. 3. Follow up with pharmacy about shingles vaccine.

## 2010-12-12 NOTE — Progress Notes (Signed)
  Subjective:    Patient ID: Robert Flynn, male    DOB: [birth year], 65 y.o.   MRN: [id number]  HPI  I have reviewed this visit and discussed with Arlys John and agree with her documentation.     Review of Systems     Objective:   Physical Exam        Assessment & Plan:

## 2010-12-12 NOTE — Progress Notes (Signed)
Patient here for annual wellness visit, patient reports: Risk Factors/Conditions needing evaluation or treatment: Patient does not have any risk factors that need evaluation. Home Safety: Patient lives in his 2 story home with his wife. Patient reports having smoke detectors and does not have adaptive equipment in bathrooms. Other Information: Corrective lens: Patient does not have corrective lens but self reports that he needs them for reading. Dentures: Patient does not have dentures and visits dentist as needed. Memory: Patient denies any memory loss.  Balance max value patientvalue  Sitting balance 1 1  Arise 2 2  Attempts to arise 2 2  Immediate standing balance 2 2  Standing balance 1 1  Nudge 2 2  Eyes closed 1 1  360 degree turn 1 1  Sitting down 2 2   Gait max value patient value  Initiation of gait 1 1  Step length-left 1 1  Step length-right 1 1  Step height-left 1 1  Step height-right 1 1  Step symmetry 1 1  Step continuity 1 1  Path 2 2  Trunk 2 2  Walking stance 1 1   Balance/Gait Score: 26/26    Annual Wellness Visit Requirements Recorded Today In  Medical, family, social history Past Medical, Family, Social History Section  Current providers Care team  Current medications Medications  Wt, BP, Ht, BMI Vital signs  Hearing assessment (welcome visit) Hearing/Vision  Tobacco, alcohol, illicit drug use History  ADL Nurse Assessment  Depression Screening Nurse Assessment  Cognitive impairment/Mini Mental Status Nurse Assessment/ Flowsheet  Fall Risk Nurse Assessment  Home Safety Progress Note  End of Life Planning (welcome visit) Social Documentation  Medicare preventative services Progress Note  Risk factors/conditions needing evaluation/treatment Progress Note  Personalized health advice Patient Instructions, goals, letter  Diet & Exercise Social Documentation  Emergency Contact Social Documentation  Seat Belts Social Documentation  Sun  exposure/protection Social Documentation    Medicare Prevention Plan: Recommended patient contact his pharmacy for shingles vaccine.   Recommended Medicare Prevention Screenings Men over 78 Test For Frequency Date of Last- BOLD if needed  Colorectal Cancer 1-10 yrs 11/06  Prostate Cancer Never or yearly 01/2009  Aortic Aneurysm Once if 65-75 with hx of smoking Never smoked  Cholesterol 5 yrs 6/11  Diabetes yearly Non diabetic  HIV yearly declined  Influenza Shot yearly 11/11  Pneumonia Shot once 3/11  Zostavax Shot once [facility] patient pamphlet

## 2011-03-18 ENCOUNTER — Other Ambulatory Visit: Payer: Self-pay | Admitting: Family Medicine

## 2011-03-18 MED ORDER — AMLODIPINE BESYLATE 10 MG PO TABS: 10.0000 mg | ORAL_TABLET | Freq: Every day | ORAL | Status: DC

## 2011-03-22 ENCOUNTER — Other Ambulatory Visit: Payer: Self-pay | Admitting: Family Medicine

## 2011-03-22 MED ORDER — COLCHICINE 0.6 MG PO TABS: ORAL_TABLET | ORAL | Status: DC

## 2011-03-22 NOTE — Telephone Encounter (Signed)
Refill request faxed from [facility] on [address] for Colcrys 0.6mg . Refilled electronically #60 with 2 Refills

## 2011-03-28 ENCOUNTER — Telehealth: Payer: Self-pay | Admitting: *Deleted

## 2011-03-28 NOTE — Telephone Encounter (Signed)
Received notice from insurance that colcrys has been approved. Pharmacy notified.

## 2011-07-04 ENCOUNTER — Emergency Department (HOSPITAL_COMMUNITY)
Admission: EM | Admit: 2011-07-04 | Discharge: 2011-07-04 | Disposition: A | Discharge status: Home / Self Care | Payer: No Typology Code available for payment source | Attending: Emergency Medicine | Admitting: Emergency Medicine

## 2011-07-04 DIAGNOSIS — Z79899 Other long term (current) drug therapy: Secondary | ICD-10-CM | POA: Insufficient documentation

## 2011-07-04 DIAGNOSIS — Z862 Personal history of diseases of the blood and blood-forming organs and certain disorders involving the immune mechanism: Secondary | ICD-10-CM | POA: Insufficient documentation

## 2011-07-04 DIAGNOSIS — I10 Essential (primary) hypertension: Secondary | ICD-10-CM | POA: Insufficient documentation

## 2011-07-04 DIAGNOSIS — Z8639 Personal history of other endocrine, nutritional and metabolic disease: Secondary | ICD-10-CM | POA: Insufficient documentation

## 2011-07-04 DIAGNOSIS — R109 Unspecified abdominal pain: Secondary | ICD-10-CM | POA: Insufficient documentation

## 2011-07-04 LAB — URINALYSIS, ROUTINE W REFLEX MICROSCOPIC
Ketones, ur: NEGATIVE mg/dL
Leukocytes, UA: NEGATIVE
Nitrite: NEGATIVE
Specific Gravity, Urine: 1.014 (ref 1.005–1.030)
pH: 5.5 (ref 5.0–8.0)

## 2011-07-26 ENCOUNTER — Ambulatory Visit: Payer: No Typology Code available for payment source | Admitting: Family Medicine

## 2011-08-06 ENCOUNTER — Telehealth: Payer: Self-pay | Admitting: *Deleted

## 2011-08-06 DIAGNOSIS — I10 Essential (primary) hypertension: Secondary | ICD-10-CM

## 2011-08-06 MED ORDER — METOPROLOL SUCCINATE ER 50 MG PO TB24: 50.0000 mg | ORAL_TABLET | Freq: Every day | ORAL | Status: DC

## 2011-08-06 NOTE — Telephone Encounter (Signed)
Patient has an appt. 08/14/2011 with Dr. Mikel Cella.  Robert Flynn

## 2011-08-14 ENCOUNTER — Ambulatory Visit: Payer: No Typology Code available for payment source | Admitting: Family Medicine

## 2011-09-11 ENCOUNTER — Other Ambulatory Visit: Payer: Self-pay | Admitting: Family Medicine

## 2011-09-11 NOTE — Telephone Encounter (Signed)
Refill request

## 2011-09-11 NOTE — Telephone Encounter (Signed)
Patient MUST make an appointment to be seen. 2 no show appointments.  Refilled for one month, but I will not refill again until he is seen.  Christel Bai M. Layli Capshaw, M.D.

## 2011-09-29 ENCOUNTER — Other Ambulatory Visit: Payer: Self-pay | Admitting: Family Medicine

## 2011-09-29 DIAGNOSIS — I10 Essential (primary) hypertension: Secondary | ICD-10-CM

## 2011-09-29 NOTE — Telephone Encounter (Signed)
Refill request

## 2011-10-04 ENCOUNTER — Ambulatory Visit (INDEPENDENT_AMBULATORY_CARE_PROVIDER_SITE_OTHER): Payer: No Typology Code available for payment source | Admitting: Family Medicine

## 2011-10-04 ENCOUNTER — Encounter: Payer: Self-pay | Admitting: Family Medicine

## 2011-10-04 DIAGNOSIS — I10 Essential (primary) hypertension: Secondary | ICD-10-CM

## 2011-10-04 DIAGNOSIS — M1990 Unspecified osteoarthritis, unspecified site: Secondary | ICD-10-CM

## 2011-10-04 DIAGNOSIS — Z23 Encounter for immunization: Secondary | ICD-10-CM

## 2011-10-04 DIAGNOSIS — N529 Male erectile dysfunction, unspecified: Secondary | ICD-10-CM

## 2011-10-04 LAB — BASIC METABOLIC PANEL
Calcium: 9.5 mg/dL (ref 8.4–10.5)
Chloride: 108 mEq/L (ref 96–112)
Creat: 1.31 mg/dL (ref 0.50–1.35)
Sodium: 143 mEq/L (ref 135–145)

## 2011-10-04 LAB — CBC
Hemoglobin: 12.9 g/dL — ABNORMAL LOW (ref 13.0–17.0)
MCH: 29.8 pg (ref 26.0–34.0)
RBC: 4.33 MIL/uL (ref 4.22–5.81)

## 2011-10-04 MED ORDER — LISINOPRIL-HYDROCHLOROTHIAZIDE 20-12.5 MG PO TABS: 1.0000 | ORAL_TABLET | Freq: Two times a day (BID) | ORAL | Status: DC

## 2011-10-04 MED ORDER — METOPROLOL SUCCINATE ER 50 MG PO TB24: 50.0000 mg | ORAL_TABLET | Freq: Every day | ORAL | Status: DC

## 2011-10-04 MED ORDER — ZOSTER VACCINE LIVE 19400 UNT/0.65ML ~~LOC~~ SOLR: 0.6500 mL | Freq: Once | SUBCUTANEOUS | Status: AC

## 2011-10-04 MED ORDER — TADALAFIL 20 MG PO TABS: 20.0000 mg | ORAL_TABLET | Freq: Every day | ORAL | Status: DC | PRN

## 2011-10-04 MED ORDER — AMLODIPINE BESYLATE 10 MG PO TABS: 10.0000 mg | ORAL_TABLET | Freq: Every day | ORAL | Status: DC

## 2011-10-04 NOTE — Patient Instructions (Signed)
It was nice to meet you today!  I have sent your refills to [facility]. Please pick these up and take them.  You would benefit from the Shingles vaccine. You may take this prescription to one of the pharmacies listed to get it.  If your labs are abnormal, I will give you a call. Otherwise, I will send a letter.  Please come back to see me in 6 months.  Take care! Rosealee Recinos M. Niajah Sipos, M.D.

## 2011-10-05 ENCOUNTER — Encounter: Payer: Self-pay | Admitting: Family Medicine

## 2011-10-06 ENCOUNTER — Encounter: Payer: Self-pay | Admitting: Family Medicine

## 2011-10-06 DIAGNOSIS — Z23 Encounter for immunization: Secondary | ICD-10-CM | POA: Insufficient documentation

## 2011-10-06 NOTE — Progress Notes (Signed)
Subjective:     Patient ID: Robert Flynn, male   DOB: [birth year], 66 y.o.   MRN: [id number]  HPI Patient is a 66 year old man coming in today for followup appointment and to get medication refills. He has no complaints today other than bilateral knee pain secondary to his osteoarthritis. For his knee pain. He has been taking Aleve twice a day, which seems to be helping. Patient is hypertensive today in clinic, but states he has not been taking his blood pressure medicine due to not having prescriptions in the pharmacy. He is also requesting refills on his chronic medications. Patient has not had lab work in quite some time, but at the last check, there were no abnormalities. Patient is in agreement to getting labs drawn today since he is on chronic medications. Patient is also interested in getting the shingles vaccine. He was given information about office staff about what the shingles vaccine is and where he can get it done. Patient is does not remember ever having the chickenpox, but given his age, he likely had a subclinical case and is immune. Either way patient can benefit from the shingles vaccine. We discussed the advantages of the shingles vaccine, as well as what shingles was a why we want to prevent it. States understanding.  Review of Systems Denies headaches, congestion, chest pain, shortness of breath, abdominal pain, dysuria, or swelling in legs. Does state he has some changes in his vision, but needs to see the eye doctor.    Objective:   Physical Exam  Constitutional: He appears well-developed and well-nourished. No distress.  HENT:  Head: Normocephalic and atraumatic.  Mouth/Throat: Oropharynx is clear and moist.  Eyes: Conjunctivae are normal. Pupils are equal, round, and reactive to light.  Neck: Normal range of motion.  Cardiovascular: Normal rate and regular rhythm.   Murmur heard. Pulmonary/Chest: Effort normal and breath sounds normal. He has no wheezes.  Abdominal: Soft.  He exhibits no distension.  Musculoskeletal: Normal range of motion. He exhibits no edema.  Skin: No rash noted.       Assessment:     66 year old male coming in for followup appointment as well as to get medications refilled.    Plan:

## 2011-10-06 NOTE — Assessment & Plan Note (Signed)
Refilled Cialis today

## 2011-10-06 NOTE — Assessment & Plan Note (Addendum)
Previously at goal, the patient is hypertensive today. Patient states it is because he has ran out of his medications. Will refill his amlodipine 10 mg, lisinopril/HCTZ 20/12.5, and metoprolol 50 mg today. These were sent to [facility]. I encouraged him to pick him up today. If patient has any headaches, dizziness, changes in vision or increased fatigue he will call the office and be seen. This patient is on chronic medications, will get basic metabolic profile today. I do not anticipate any abnormalities on this, but I will let patient know.

## 2011-10-06 NOTE — Assessment & Plan Note (Signed)
Patient given information as well as prescription for shingles vaccine. He will have to take this to specify [place] sees and have administered. Patient states the understanding of why he needs his vaccine or the benefits are. Will readdress at next appointment if he had this vaccine done.

## 2011-10-06 NOTE — Assessment & Plan Note (Signed)
Patient has osteoarthritis in knees bilaterally. This is well controlled with Aleve and I encouraged him to continue taking Aleve as needed. He also has gout, which he states is only in his toes and his well-controlled with Colchicine. He does not feel that his knee pain is related to his gout pain. If the pain gets worse or if he has difficulty ambulating. He will call the office and let me know.

## 2011-11-28 ENCOUNTER — Telehealth: Payer: Self-pay | Admitting: Family Medicine

## 2011-11-28 NOTE — Telephone Encounter (Signed)
Patient would like to increase his Cialis to 40 mg.  I attempted to incourage him to make an appt for this, but he said that Dr. Mikel Cella told him he did not need to be seen for 6 months and he could call anytime he needed something.

## 2011-11-28 NOTE — Telephone Encounter (Signed)
Patient is on the maximum recommended dose. He should continue taking 20mg  (and no more than 20mg /24 hours). Please let patient know.  Thank you! Aaryn Parrilla M. Etsuko Dierolf, M.D.

## 2011-11-29 NOTE — Telephone Encounter (Signed)
Patient informed, expressed understanding. 

## 2011-12-10 ENCOUNTER — Encounter: Payer: Self-pay | Admitting: Home Health Services

## 2012-03-25 ENCOUNTER — Encounter: Payer: Self-pay | Admitting: Family Medicine

## 2012-03-25 ENCOUNTER — Ambulatory Visit (INDEPENDENT_AMBULATORY_CARE_PROVIDER_SITE_OTHER): Payer: No Typology Code available for payment source | Admitting: Family Medicine

## 2012-03-25 ENCOUNTER — Ambulatory Visit (HOSPITAL_COMMUNITY)
Admission: RE | Admit: 2012-03-25 | Discharge: 2012-03-25 | Disposition: A | Discharge status: Home / Self Care | Payer: No Typology Code available for payment source | Source: Ambulatory Visit | Attending: Family Medicine | Admitting: Family Medicine

## 2012-03-25 VITALS — BP 140/74 | HR 41 | Temp 98.2°F | Ht 69.0 in | Wt 194.0 lb

## 2012-03-25 DIAGNOSIS — I498 Other specified cardiac arrhythmias: Secondary | ICD-10-CM | POA: Insufficient documentation

## 2012-03-25 DIAGNOSIS — Z0000 Encounter for general adult medical examination without abnormal findings: Secondary | ICD-10-CM

## 2012-03-25 DIAGNOSIS — I4510 Unspecified right bundle-branch block: Secondary | ICD-10-CM | POA: Insufficient documentation

## 2012-03-25 DIAGNOSIS — R001 Bradycardia, unspecified: Secondary | ICD-10-CM

## 2012-03-25 DIAGNOSIS — I10 Essential (primary) hypertension: Secondary | ICD-10-CM

## 2012-03-25 DIAGNOSIS — N529 Male erectile dysfunction, unspecified: Secondary | ICD-10-CM

## 2012-03-25 LAB — LIPID PANEL
HDL: 30 mg/dL — ABNORMAL LOW (ref >39)
LDL Cholesterol: 99 mg/dL (ref 0–99)
Total CHOL/HDL Ratio: 4.8 Ratio

## 2012-03-25 LAB — CBC
HCT: 36.3 % — ABNORMAL LOW (ref 39.0–52.0)
MCV: 85 fL (ref 78.0–100.0)
RBC: 4.27 MIL/uL (ref 4.22–5.81)
WBC: 4.6 10*3/uL (ref 4.0–10.5)

## 2012-03-25 LAB — TSH: TSH: 0.836 u[IU]/mL (ref 0.350–4.500)

## 2012-03-25 MED ORDER — AMLODIPINE BESYLATE 10 MG PO TABS: 10.0000 mg | ORAL_TABLET | Freq: Every day | ORAL | Status: DC

## 2012-03-25 MED ORDER — COLCHICINE 0.6 MG PO TABS: ORAL_TABLET | ORAL | Status: DC

## 2012-03-25 MED ORDER — TADALAFIL 20 MG PO TABS: 20.0000 mg | ORAL_TABLET | Freq: Every day | ORAL | Status: DC | PRN

## 2012-03-25 MED ORDER — LISINOPRIL-HYDROCHLOROTHIAZIDE 20-12.5 MG PO TABS: 1.0000 | ORAL_TABLET | Freq: Two times a day (BID) | ORAL | Status: DC

## 2012-03-25 NOTE — Progress Notes (Signed)
Subjective:     Patient ID: Robert Flynn, male   DOB: [birth year], 66 y.o.   MRN: [id number]  HPI  HTN- Patient currently on Amlodipine, Linsinopril-HCTZ and Metoprolol, BP 159/65 with heart rate of 41 in triage today. Overall, patient states he feels well. He has no headaches, CP, SOB, fatigue or edema. He takes all medications as prescribed. He brought his medications with him today. He does need medication refills sent to [facility]. Recheck of BP was 140/74 after resting.   Impotence- Patient has been on Cialis for a long time. He states it does not always work for him. He has no problems urinating. He states he has the desire to have intercourse but he is not able to maintain an erection.   Arthritis- Doing well. No concerns today. He does use Aleve a few times per week if needed. He also has gout and uses Colchicine only as needed. He needs a refill on that today as well.   Health Maintenance- Patient due to labs today. He also has not had an EKG recently. He has not had his shingles vaccine yet. He states he still has his Rx from last visit and I have encouraged him to get that filled and have it administered. Will need flu vaccine this fall.   History reviewed: Non-smoker. Drinks EtOH a few times per month.   Review of Systems See HPI above    Objective:   Physical Exam  Constitutional: He is oriented to person, place, and time. He appears well-developed and well-nourished. No distress.  HENT:  Head: Normocephalic and atraumatic.  Neck: Normal range of motion.  Cardiovascular: Regular rhythm and intact distal pulses.   No murmur heard.      Bradycardia to high 30's on exam while resting  Pulmonary/Chest: Effort normal and breath sounds normal. He has no wheezes.  Abdominal: Soft. He exhibits no distension. There is no tenderness.  Musculoskeletal: Normal range of motion. He exhibits no edema.  Lymphadenopathy:    He has no cervical adenopathy.  Neurological: He is alert and  oriented to person, place, and time.  Skin: Skin is warm and dry. No rash noted.     EKG performed: (No old EKG to compare) Bradycardia to 40. Right bundle branch block with left axis deviation. Discussed with Dr. Leveda Anna  Assessment:    66 yo M with pmh of HTN, impotence and gout presenting for CPE    Plan:

## 2012-03-25 NOTE — Patient Instructions (Signed)
It was good to see you today. Your heart beat is a little slow today. STOP TAKING THE METOPROLOL 50MG . Continue all other medications. Your blood pressure may go up a little, so come back to see me in 1-2 months, or sooner if you have any problems. If you have chest pain, headaches, shortness of breath or don't feel well, please be seen in the ED.  Shirely Toren M. Riggin Cuttino, M.D.

## 2012-03-25 NOTE — Assessment & Plan Note (Signed)
Will continue Cialis for now. Hopefully patient will have some improvement after stopping the beta blocker.

## 2012-03-25 NOTE — Assessment & Plan Note (Addendum)
On 4 medications for BP due to difficulty controlling. Currently with wide pulse pressure and bradycardia. Will stop metoprolol and recheck in 1 month. If patient has chest pain, headaches, blurred vision or does not feel well he should be evaluated. Labs collected today including CBC, TSH, and fasting Lipid panel. Will send letter or call pt with these results.

## 2012-03-25 NOTE — Assessment & Plan Note (Signed)
Awaiting patient to get shingles vaccine from pharmacy. Will need flu shot in the fall. Labs collected today.

## 2012-03-25 NOTE — Assessment & Plan Note (Signed)
Noted on triage, and on EKG. Patient overall feels well. Will stop Metoprolol today. Patient to return to clinic within the next 1 month. Will repeat EKG at that time. If he remains bradycardic, will consider referral to Cardiology. Patient agrees and able to repeat back to me the plan.

## 2012-03-26 ENCOUNTER — Encounter: Payer: Self-pay | Admitting: Family Medicine

## 2012-04-30 ENCOUNTER — Ambulatory Visit: Payer: No Typology Code available for payment source | Admitting: Family Medicine

## 2012-05-15 ENCOUNTER — Ambulatory Visit (INDEPENDENT_AMBULATORY_CARE_PROVIDER_SITE_OTHER): Payer: No Typology Code available for payment source | Admitting: Home Health Services

## 2012-05-15 ENCOUNTER — Encounter: Payer: Self-pay | Admitting: Home Health Services

## 2012-05-15 VITALS — BP 140/71 | HR 60 | Temp 97.7°F | Ht 68.5 in | Wt 190.5 lb

## 2012-05-15 DIAGNOSIS — Z0000 Encounter for general adult medical examination without abnormal findings: Secondary | ICD-10-CM

## 2012-05-15 NOTE — Progress Notes (Signed)
Patient here for annual wellness visit, patient reports: Risk Factors/Conditions needing evaluation or treatment: Pt does not have any new risk factors that need evaluation. Home Safety: PT lives with wife in 1 story home.  Pt reports having smoke detectors. Other Information: Corrective lens: Pt wears corrective lens for reading.  Pt does not have a regular eye dr. Robbie Louis: Pt does not have dentures. Pt does not have regular dentist.  Memory: Pt denies memory loss.  Patient's Mini Mental Score (recorded in doc. flowsheet): No able to accurately access due to low literacy level.  Balance/Gait: Pt does not have any noticeable impairment.  Balance Abnormal Patient value  Sitting balance    Sit to stand    Attempts to arise    Immediate standing balance    Standing balance    Nudge    Eyes closed- Romberg    Tandem stance    Back lean    Neck Rotation    360 degree turn    Sitting down     Gait Abnormal Patient value  Initiation of gait    Step length-left    Step length-right    Step height-left    Step height-right    Step symmetry    Step continuity    Path deviation    Trunk movement    Walking stance        Annual Wellness Visit Requirements Recorded Today In  Medical, family, social history Past Medical, Family, Social History Section  Current providers Care team  Current medications Medications  Wt, BP, Ht, BMI Vital signs  Tobacco, alcohol, illicit drug use History  ADL Nurse Assessment  Depression Screening Nurse Assessment  Cognitive impairment Nurse Assessment  Mini Mental Status Document Flowsheet  Fall Risk Nurse Assessment  Home Safety Progress Note  End of Life Planning (welcome visit) Social Documentation  Medicare preventative services Progress Note  Risk factors/conditions needing evaluation/treatment Progress Note  Personalized health advice Patient Instructions, goals, letter  Diet & Exercise Social Documentation  Emergency Contact Social  Documentation  Seat Belts Social Documentation  Sun exposure/protection Social Documentation    Medicare Prevention Plan:   Recommended Medicare Prevention Screenings Men over 65 Test For Frequency Date of Last- BOLD if needed  Colorectal Cancer 1-10 yrs 11/6  Prostate Cancer Never or yearly NI  Aortic Aneurysm Once if 65-75 with hx of smoking NI- no hx of smoking  Cholesterol 5 yrs 7/13  Diabetes yearly 1/13  HIV yearly declined  Influenza Shot yearly Reminded to get shot 06/2012  Pneumonia Shot once 3/11  Zostavax Shot once Pt not interested.

## 2012-05-15 NOTE — Patient Instructions (Signed)
1. Consider scheduling an eye exam. 2. Continue to work on weight loss by eating 3-5 fruits and vegetables a day. 3. Consider getting shingle vaccine. 4. Start walking for 30 minutes every other day.

## 2012-05-19 ENCOUNTER — Ambulatory Visit: Payer: No Typology Code available for payment source | Admitting: Family Medicine

## 2012-06-02 ENCOUNTER — Ambulatory Visit (INDEPENDENT_AMBULATORY_CARE_PROVIDER_SITE_OTHER): Payer: No Typology Code available for payment source | Admitting: Family Medicine

## 2012-06-02 ENCOUNTER — Ambulatory Visit (HOSPITAL_COMMUNITY)
Admission: RE | Admit: 2012-06-02 | Discharge: 2012-06-02 | Disposition: A | Discharge status: Home / Self Care | Payer: No Typology Code available for payment source | Source: Ambulatory Visit | Attending: Family Medicine | Admitting: Family Medicine

## 2012-06-02 ENCOUNTER — Encounter: Payer: Self-pay | Admitting: Family Medicine

## 2012-06-02 VITALS — BP 159/69 | HR 57 | Temp 98.6°F | Ht 68.5 in | Wt 190.0 lb

## 2012-06-02 DIAGNOSIS — I498 Other specified cardiac arrhythmias: Secondary | ICD-10-CM | POA: Insufficient documentation

## 2012-06-02 DIAGNOSIS — R001 Bradycardia, unspecified: Secondary | ICD-10-CM

## 2012-06-02 DIAGNOSIS — I452 Bifascicular block: Secondary | ICD-10-CM | POA: Insufficient documentation

## 2012-06-02 DIAGNOSIS — Z23 Encounter for immunization: Secondary | ICD-10-CM | POA: Insufficient documentation

## 2012-06-02 NOTE — Assessment & Plan Note (Signed)
Continues to have sinus bradycardia. Patient reports he is asymptomatic. EKG today shows ventricular rate of 49, RBBB and bifascicular block. Patient states he has never been to a cardiologist. Given his bradycardia, even off the beta blocker, will refer to cards for further evaluation. Patient agrees to this plan.

## 2012-06-02 NOTE — Progress Notes (Signed)
Subjective:     Patient ID: Robert Flynn, male   DOB: 1946-07-24, 66 y.o.   MRN: [id number]  HPI Pt presents to the office for a follow up on his bradycardia. At last office visit, he was bradycardic to the 40's after resting. EKG showed RBBB and sinus brady. His Metoprolol was stopped at that visit as well. Since then, he has continued to feel well. He does not report any symptoms and states he feels like his normal self.  He is feeling well today. No HA, CP, lightheadedness, or syncope. He is able to perform his everyday tasks with no problems. He exercises by climbing steps and he says he has some SOB but that is not new.   Today, his pulse was 57 at triage after walking from the waiting room. EKG shows vent rate of 49. He states he has never seen a cardiologist.  History reviewed: Nonsmoker  Review of Systems See HPI above    Objective:   Physical Exam General: Awake, alert, NAD. Appears younger than stated age HEENT: AT, [place] Neck: No LAD Heart: Bradycardic, regular rhythm. No murmur Lungs: CTAB Abd: Soft, nontender. No masses appreciated Ext: No edema Neuro: Grossly intact    Assessment:     66 yo M with bradycardia    Plan:     See Problem List

## 2012-06-02 NOTE — Patient Instructions (Signed)
It was good to see you today. I am glad you are feeing well.  I am going to set up an appointment with the heart doctor for your low heart rate.  Please come back to see me in 3 months. Take care! Anndrea Mihelich M. Sevyn Markham, M.D.

## 2012-06-15 ENCOUNTER — Encounter: Payer: Self-pay | Admitting: *Deleted

## 2012-06-16 ENCOUNTER — Encounter: Payer: Self-pay | Admitting: Cardiovascular Disease

## 2012-06-16 ENCOUNTER — Ambulatory Visit (INDEPENDENT_AMBULATORY_CARE_PROVIDER_SITE_OTHER): Payer: No Typology Code available for payment source | Admitting: Cardiovascular Disease

## 2012-06-16 VITALS — BP 189/90 | HR 56 | Wt 191.0 lb

## 2012-06-16 DIAGNOSIS — R001 Bradycardia, unspecified: Secondary | ICD-10-CM

## 2012-06-16 DIAGNOSIS — I10 Essential (primary) hypertension: Secondary | ICD-10-CM

## 2012-06-16 DIAGNOSIS — I498 Other specified cardiac arrhythmias: Secondary | ICD-10-CM

## 2012-06-16 DIAGNOSIS — R011 Cardiac murmur, unspecified: Secondary | ICD-10-CM

## 2012-06-16 NOTE — Assessment & Plan Note (Signed)
Well controlled.  Continue current medications and low sodium Dash type diet.   Avoid beta blockers with bradycardia

## 2012-06-16 NOTE — Assessment & Plan Note (Signed)
Asymptomatic with no AV block.  ETT to assess chronotropic competence.  No indication for pacer unless ETT shows AV block or chronotropic incompetence

## 2012-06-16 NOTE — Progress Notes (Signed)
Patient ID: Robert Flynn, male   DOB: August 30, 1946, 66 y.o.   MRN: [id number] 66 yo referred by primary for bradycardia.  Beta blocker stopped last mont and HR in high 40's low 50's.  Patient is asymptomatic.  Still works cleaning part time. No fatigue, dsypnea presyncope or chest pain.  Does have HTN.  Denies history of connective tissue disease.  No tick bites.  No family history of pacer placement.  AV nodal blocking drugs stopped. No renal failure and K is fine.    ROS: Denies fever, malais, weight loss, blurry vision, decreased visual acuity, cough, sputum, SOB, hemoptysis, pleuritic pain, palpitaitons, heartburn, abdominal pain, melena, lower extremity edema, claudication, or rash.  All other systems reviewed and negative   General: Affect appropriate Healthy:  appears stated age HEENT: normal Neck supple with no adenopathy JVP normal no bruits no thyromegaly Lungs clear with no wheezing and good diaphragmatic motion Heart:  S1/S2 systolic murmur, no rub, gallop or click PMI normal Abdomen: benighn, BS positve, no tenderness, no AAA no bruit.  No HSM or HJR Distal pulses intact with no bruits No edema Neuro non-focal Skin warm and dry No muscular weakness  Medications Current Outpatient Prescriptions  Medication Sig Dispense Refill  . amLODipine (NORVASC) 10 MG tablet Take 1 tablet (10 mg total) by mouth daily.  30 tablet  6  . aspirin 81 MG tablet Take 81 mg by mouth daily.        . colchicine 0.6 MG tablet Take two tablets by mouth two times a day for gout flare  60 tablet  2  . lisinopril-hydrochlorothiazide (PRINZIDE,ZESTORETIC) 20-12.5 MG per tablet Take 1 tablet by mouth 2 (two) times daily.  60 tablet  6  . tadalafil (CIALIS) 20 MG tablet Take 1 tablet (20 mg total) by mouth daily as needed.  10 tablet  3    Allergies Review of patient's allergies indicates no known allergies.  Family History: Family History  Problem Relation Age of Onset  . Heart disease Brother      Social History: History   Social History  . Marital Status: Married    Spouse Name: Robert Flynn    Number of Children: 5  . Years of Education: 9   Occupational History  . RetiredTEFL teacher     Social History Main Topics  . Smoking status: Never Smoker   . Smokeless tobacco: Never Used  . Alcohol Use: 1.0 oz/week    2 drink(s) per week  . Drug Use: No  . Sexually Active: Not on file   Other Topics Concern  . Not on file   Social History Narrative   Health Care POA: Emergency Contact: Son, Robert Flynn. End of Life Plan: Patient does not have advance directivesWho lives with you: WifeAny pets: noneDiet: Patient eats very little but diet is varied.  Eat an apple a day.Exercise: Patient does not have regular exercise routine but reports walking a few times a week. Seatbelts: Patient reports wearing seat belt when in vehicle. Hobbies: fishing    Electrocardiogram:  Assessment and Plan

## 2012-06-16 NOTE — Assessment & Plan Note (Signed)
SEM  Check echo to make sure no significant calcification of aortic annulus given bradycardia

## 2012-06-16 NOTE — Patient Instructions (Addendum)
Your physician wants you to follow-up in:   YEAR WITH DR Haywood Filler will receive a reminder letter in the mail two months in advance. If you don't receive a letter, please call our office to schedule the follow-up appointment. Your physician recommends that you continue on your current medications as directed. Please refer to the Current Medication list given to you today.  Your physician has requested that you have an exercise tolerance test. For further information please visit [email or web address] Please also follow instruction sheet, as given. DX BRADYCARDIA Your physician has requested that you have an echocardiogram. Echocardiography is a painless test that uses sound waves to create images of your heart. It provides your doctor with information about the size and shape of your heart and how well your heart's chambers and valves are working. This procedure takes approximately one hour. There are no restrictions for this procedure. DX MURMUR

## 2012-06-18 ENCOUNTER — Ambulatory Visit (HOSPITAL_COMMUNITY): Payer: No Typology Code available for payment source | Attending: Cardiology | Admitting: Radiology

## 2012-06-18 DIAGNOSIS — R011 Cardiac murmur, unspecified: Secondary | ICD-10-CM

## 2012-06-18 DIAGNOSIS — I379 Nonrheumatic pulmonary valve disorder, unspecified: Secondary | ICD-10-CM | POA: Insufficient documentation

## 2012-06-18 DIAGNOSIS — I10 Essential (primary) hypertension: Secondary | ICD-10-CM | POA: Insufficient documentation

## 2012-06-18 NOTE — Progress Notes (Signed)
Echocardiogram performed.  

## 2012-06-18 NOTE — Progress Notes (Signed)
Patient ID: Robert Flynn, male   DOB: Apr 29, 1946, 66 y.o.   MRN: [id number] Write addendum: I have reviewed this visit and discussed with Arlys John and agree with her documentation.  Nakhia Levitan M. Avigdor Dollar, M.D.

## 2012-07-02 ENCOUNTER — Encounter: Payer: Self-pay | Admitting: Physician Assistant

## 2012-07-02 ENCOUNTER — Ambulatory Visit (INDEPENDENT_AMBULATORY_CARE_PROVIDER_SITE_OTHER): Payer: No Typology Code available for payment source | Admitting: Physician Assistant

## 2012-07-02 DIAGNOSIS — I459 Conduction disorder, unspecified: Secondary | ICD-10-CM

## 2012-07-02 DIAGNOSIS — R001 Bradycardia, unspecified: Secondary | ICD-10-CM

## 2012-07-02 DIAGNOSIS — I498 Other specified cardiac arrhythmias: Secondary | ICD-10-CM

## 2012-07-02 NOTE — Procedures (Signed)
Exercise Treadmill Test  Pre-Exercise Testing Evaluation Rhythm: sinus bradycardia  Rate: 50   PR:  .19 QRS:  .13  QT:  .45 QTc: .41     Test  Exercise Tolerance Test Ordering MD: Charlton Haws, MD  Interpreting MD: Tereso Newcomer , PA-C  Unique Test No: 1  Treadmill:  1  Indication for ETT: Bradycardia  Contraindication to ETT: No   Stress Modality: exercise - treadmill  Cardiac Imaging Performed: non   Protocol: standard Bruce - maximal  Max BP:  165/65  Max MPHR (bpm):  152 85% MPR (bpm):  129  MPHR obtained (bpm):  63 % MPHR obtained:  41%  Reached 85% MPHR (min:sec):  n/a Total Exercise Time (min-sec):  4:55  Workload in METS:  6.9 Borg Scale: 15  Reason ETT Terminated:  patient's desire to stop    ST Segment Analysis At Rest: normal ST segments - no evidence of significant ST depression With Exercise: no evidence of significant ST depression  Other Information Arrhythmia:  Yes Angina during ETT:  absent (0) Quality of ETT:  non-diagnostic  ETT Interpretation:  Abnormal ETT with chronotropic incompetence and 3:1 heart block.  Comments: Poor exercise tolerance. No chest pain. Normal BP response to exercise. No ischemic ST changes at submaximal exercise. Patient did demonstrate sinus tachycardia with exercise with atrial rate of about 130 with probable 3:1 heart block and ventricular rate of about 50.   Recommendations: Discussed with EP (Dr. Ladona Ridgel). Patient denies hx of syncope. Will arrange appt with Dr. Ladona Ridgel to discuss pacemaker implantation. D/w Jaci Standard.  Signed, Tereso Newcomer, PA-C  11:30 AM 07/02/2012

## 2012-07-02 NOTE — Patient Instructions (Addendum)
You have been referred to DR. Ladona Ridgel PER SCOTT WEAVER, PAC AND DR. Ladona Ridgel;  NEEDS TO BE SCHEDULED FOR PT TO BE SEEN WITHIN THE NEXT 2 WEEKS FOR DX HEART BLOCK

## 2012-07-09 ENCOUNTER — Encounter: Payer: Self-pay | Admitting: *Deleted

## 2012-07-09 ENCOUNTER — Ambulatory Visit (INDEPENDENT_AMBULATORY_CARE_PROVIDER_SITE_OTHER): Payer: No Typology Code available for payment source | Admitting: Internal Medicine

## 2012-07-09 ENCOUNTER — Encounter: Payer: Self-pay | Admitting: Internal Medicine

## 2012-07-09 VITALS — BP 151/64 | HR 42 | Ht 69.0 in | Wt 196.0 lb

## 2012-07-09 DIAGNOSIS — I442 Atrioventricular block, complete: Secondary | ICD-10-CM | POA: Insufficient documentation

## 2012-07-09 DIAGNOSIS — R001 Bradycardia, unspecified: Secondary | ICD-10-CM

## 2012-07-09 DIAGNOSIS — I498 Other specified cardiac arrhythmias: Secondary | ICD-10-CM

## 2012-07-09 DIAGNOSIS — Z01810 Encounter for preprocedural cardiovascular examination: Secondary | ICD-10-CM

## 2012-07-09 DIAGNOSIS — I452 Bifascicular block: Secondary | ICD-10-CM

## 2012-07-09 NOTE — Patient Instructions (Addendum)
Your physician has recommended that you have a pacemaker inserted. A pacemaker is a small device that is placed under the skin of your chest or abdomen to help control abnormal heart rhythms. This device uses electrical pulses to prompt the heart to beat at a normal rate. Pacemakers are used to treat heart rhythms that are too slow. Wire (leads) are attached to the pacemaker that goes into the chambers of you heart. This is done in the hospital and usually requires and overnight stay. Please see the instruction sheet given to you today for more information.   Your physician has requested that you have a lexiscan myoview. For further information please visit [email or web address] Please follow instruction sheet, as given.

## 2012-07-09 NOTE — Assessment & Plan Note (Signed)
As above In addition, he has Q waves in the anterior precordium and as such we need to exclude coronary artery disease as potential contributor to his heart block. We'll undertake a llexi scan Myoview

## 2012-07-09 NOTE — Progress Notes (Signed)
ELECTROPHYSIOLOGY CONSULT NOTE  Patient ID: Robert Flynn, MRN: [id number], DOB/AGE: 1946-08-30 66 y.o. Admit date: (Not on file) Date of Consult: 07/09/2012  Primary Physician: Robert Pickle, MD Primary Cardiologist: Robert Flynn   Chief Complaint:     HPI Robert Flynn is a 66 y.o. male  seen at the request of Dr. Jamse Flynn because of documented bradycardia as well as exercise associated worsening of heart block. He has underlying bifascicular block with first degree AV block and sinus bradycardia. Was admitted for treadmill testing at 1 minute into stage II his sinus rate was 150 and his ventricular response rate was 50.  He has noted worsening of exercise intolerance with some lightheadedness. He has not had chest pain.  He has had no constitutional symptoms. He denies tick bites. There is no family history. Further evaluation included an echo which demonstrated normal left ventricular function mild dilatation of the ACE inhibitor  Past Medical History  Diagnosis Date  . Gout   . Hypertension   . OBESITY, NOS   . ANXIETY   . ADJUSTMENT DISORDER WITHOUT DEPRESSED MOOD   . EXTERNAL HEMORRHOIDS   . OSTEOARTHRITIS, MULTI SITES   . CARDIAC MURMUR   . Routine adult health maintenance   . Bradycardia       Surgical History: No past surgical history on file.   Home Meds: Prior to Admission medications   Medication Sig Start Date End Date Taking? Authorizing Provider  amLODipine (NORVASC) 10 MG tablet Take 1 tablet (10 mg total) by mouth daily. 03/25/12  Yes Robert Nydia Bouton, MD  aspirin 81 MG tablet Take 81 mg by mouth daily.     Yes Historical Provider, MD  colchicine 0.6 MG tablet Take two tablets by mouth two times a day for gout flare 03/25/12  Yes Robert Nydia Bouton, MD  lisinopril-hydrochlorothiazide (PRINZIDE,ZESTORETIC) 20-12.5 MG per tablet Take 1 tablet by mouth 2 (two) times daily. 03/25/12  Yes Robert Nydia Bouton, MD  tadalafil (CIALIS) 20 MG tablet Take 1 tablet (20 mg total) by  mouth daily as needed. 03/25/12  Yes Robert Nydia Bouton, MD    Allergies: No Known Allergies  History   Social History  . Marital Status: Married    Spouse Name: Robert Flynn    Number of Children: 5  . Years of Education: 9   Occupational History  . RetiredTEFL teacher     Social History Main Topics  . Smoking status: Never Smoker   . Smokeless tobacco: Never Used  . Alcohol Use: 1.0 oz/week    2 drink(s) per week  . Drug Use: No  . Sexually Active: Not on file   Other Topics Concern  . Not on file   Social History Narrative   Health Care POA: Emergency Contact: Son, Robert Flynn. End of Life Plan: Patient does not have advance directivesWho lives with you: WifeAny pets: noneDiet: Patient eats very little but diet is varied.  Eat an apple a day.Exercise: Patient does not have regular exercise routine but reports walking a few times a week. Seatbelts: Patient reports wearing seat belt when in vehicle. Hobbies: fishing     Family History  Problem Relation Age of Onset  . Heart disease Brother      ROS:  Please see the history of present illness.    All other systems reviewed and negative.    Physical Exam:  *Blood pressure 151/64, pulse 42, height 5\' 9"  (1.753 m), weight 196 lb (88.905 kg). General: Well developed, well nourished  male in no acute distress. Head: Normocephalic, atraumatic, sclera non-icteric, no xanthomas, nares are without discharge. Lymph Nodes:  none Back: without scoliosis/kyphosis , no CVA tendersness Neck: Negative for carotid bruits. JVD not elevated. Lungs: Clear bilaterally to auscultation without wheezes, rales, or rhonchi. Breathing is unlabored. Heart: RRR with S1 S2.2/6 systolic murmur , rubs, or gallops appreciated. Abdomen: Soft, non-tender, non-distended with normoactive bowel sounds. No hepatomegaly. No rebound/guarding. No obvious abdominal masses. Msk:  Strength and tone appear normal for age. Extremities: No clubbing or cyanosis. No  edema.   Distal pedal pulses are 2+ and equal bilaterally. Skin: Warm and Dry Neuro: Alert and oriented X 3. CN III-XII intact Grossly normal sensory and motor function . Psych:  Responds to questions appropriately with a normal affect.      Labs: Cardiac Enzymes No results found for this basename: CKTOTAL:4,CKMB:4,TROPONINI:4 in the last 72 hours CBC Lab Results  Component Value Date   WBC 4.6 03/25/2012   HGB 12.7* 03/25/2012   HCT 36.3* 03/25/2012   MCV 85.0 03/25/2012   PLT 214 03/25/2012   PROTIME: No results found for this basename: LABPROT:3,INR:3 in the last 72 hours Chemistry No results found for this basename: NA,K,CL,CO2,BUN,CREATININE,CALCIUM,LABALBU,PROT,BILITOT,ALKPHOS,ALT,AST,GLUCOSE in the last 168 hours Lipids Lab Results  Component Value Date   CHOL 143 03/25/2012   HDL 30* 03/25/2012   LDLCALC 99 03/25/2012   TRIG 68 03/25/2012   BNP No results found for this basename: probnp   Miscellaneous No results found for this basename: DDIMER    Radiology/Studies:  No results found.  EKG:  Sinus rhythm at 59 Intervals 0.20/13/44 Access left a -51 Q waves V1 V2   Assessment and Plan:   Robert Flynn

## 2012-07-09 NOTE — Assessment & Plan Note (Signed)
The patient has resting sinus bradycardia, but that is not the issue. The patient has bifascicular block and has exercise associated progressive heart block initially going 2: 1 and 3:1 and has symptoms associated with progressive exercise intolerance. He needs anti-bradycardia pacing. We have discussed this extensively including benefits and risks. I suggested that they do it sooner rather than later but they are electing to do it in mid-November. He is aware of the potential for syncope as well as cardiac arrest and death.

## 2012-07-21 ENCOUNTER — Encounter (HOSPITAL_COMMUNITY): Payer: Self-pay

## 2012-07-21 ENCOUNTER — Ambulatory Visit (HOSPITAL_COMMUNITY): Payer: No Typology Code available for payment source | Attending: Cardiology | Admitting: Radiology

## 2012-07-21 ENCOUNTER — Other Ambulatory Visit (HOSPITAL_COMMUNITY): Payer: Self-pay | Admitting: Radiology

## 2012-07-21 VITALS — BP 130/80 | Ht 69.0 in | Wt 192.0 lb

## 2012-07-21 DIAGNOSIS — I440 Atrioventricular block, first degree: Secondary | ICD-10-CM

## 2012-07-21 DIAGNOSIS — I4430 Unspecified atrioventricular block: Secondary | ICD-10-CM

## 2012-07-21 DIAGNOSIS — R42 Dizziness and giddiness: Secondary | ICD-10-CM | POA: Insufficient documentation

## 2012-07-21 DIAGNOSIS — R0989 Other specified symptoms and signs involving the circulatory and respiratory systems: Secondary | ICD-10-CM | POA: Insufficient documentation

## 2012-07-21 DIAGNOSIS — I10 Essential (primary) hypertension: Secondary | ICD-10-CM | POA: Insufficient documentation

## 2012-07-21 DIAGNOSIS — R001 Bradycardia, unspecified: Secondary | ICD-10-CM

## 2012-07-21 DIAGNOSIS — R0609 Other forms of dyspnea: Secondary | ICD-10-CM | POA: Insufficient documentation

## 2012-07-21 DIAGNOSIS — R0602 Shortness of breath: Secondary | ICD-10-CM

## 2012-07-21 DIAGNOSIS — I4510 Unspecified right bundle-branch block: Secondary | ICD-10-CM | POA: Insufficient documentation

## 2012-07-21 DIAGNOSIS — Z01810 Encounter for preprocedural cardiovascular examination: Secondary | ICD-10-CM

## 2012-07-21 MED ORDER — REGADENOSON 0.4 MG/5ML IV SOLN
0.4000 mg | Freq: Once | INTRAVENOUS | Status: AC
  Administered 2012-07-21: 0.4 mg via INTRAVENOUS

## 2012-07-21 MED ORDER — TECHNETIUM TC 99M SESTAMIBI GENERIC - CARDIOLITE
11.0000 | Freq: Once | INTRAVENOUS | Status: AC | PRN
  Administered 2012-07-21: 11 via INTRAVENOUS

## 2012-07-21 MED ORDER — TECHNETIUM TC 99M SESTAMIBI GENERIC - CARDIOLITE
33.0000 | Freq: Once | INTRAVENOUS | Status: AC | PRN
  Administered 2012-07-21: 33 via INTRAVENOUS

## 2012-07-21 MED ORDER — AMINOPHYLLINE 25 MG/ML IV SOLN
75.0000 mg | Freq: Once | INTRAVENOUS | Status: AC
  Administered 2012-07-21: 75 mg via INTRAVENOUS

## 2012-07-21 NOTE — Progress Notes (Signed)
Patient ID: Robert Flynn, male   DOB: [birth year], 66 y.o.   MRN: [id number]  The patient here for Lexiscan Cardiolite. The patient complained of chest tightness with Lexiscan. There was a persistent 2nd AVB type-2 till 12 minutes in recovery, with symptom of fatique. Dr Riley Kill reviewed EKG strips with order to get 2nd images, and then repeat EKG.  Aminophylline 75 mg IVP given due to persistent 2nd AVB in recovery. Images and repeated EKG  was reviewed with Dr. Riley Kill with ok for the patient to leave, but can not drive till after pacemaker insertion. Irean Hong, RN.

## 2012-07-21 NOTE — Progress Notes (Signed)
[facility] SITE 3 NUCLEAR MED [address] [id number] [place] [address] [phone]  Cardiology Nuclear Med Study  EDRIK RUNDLE is a 66 y.o. male     MRN : [id number]     DOB: December 11, 1945  Procedure Date: 07/21/2012  Nuclear Med Background Indication for Stress Test:  Evaluation for Ischemia, and 07-02-12 Abnormal GXT: Bradycardia/Exercise worsening AVB with underlying Bifascular block, and 1st degree AVB, and Pending Pacer insertion on 08-03-12 due to worsening heart Block History: No prior known history of CAD, and 06-18-12 Echo: EF=60-65% Cardiac Risk Factors: Hypertension and RBBB/LAFB  Symptoms:  DOE, Fatigue, Fatigue with Exertion and Light-Headedness   Nuclear Pre-Procedure Caffeine/Decaff Intake:  None > 12 hrs NPO After: 6:00pm   Lungs:  clear O2 Sat: 98% on room air. IV 0.9% NS with Angio Cath:  22g  IV Site: R Hand x 1, tolerated well IV Started by:  Irean Hong, RN  Chest Size (in):  48 Cup Size: n/a  Height: 5\' 9"  (1.753 m)  Weight:  192 lb (87.091 kg)  BMI:  Body mass index is 28.35 kg/(m^2). Tech Comments:  n/a    Nuclear Med Study 1 or 2 day study: 1 day  Stress Test Type:  Lexiscan  Reading MD: Willa Rough, MD  Order Authorizing Provider:  [name], MD  Resting Radionuclide: Technetium 73m Sestamibi  Resting Radionuclide Dose: 11.0 mCi   Stress Radionuclide:  Technetium 61m Sestamibi  Stress Radionuclide Dose: 33.0 mCi           Stress Protocol Rest HR: 50 Stress HR: 57  Rest BP: 130/80 Stress BP: 145/84  Exercise Time (min): n/a METS: n/a   Predicted Max HR: 152 bpm % Max HR: 37.5 bpm Rate Pressure Product: 8265   Dose of Adenosine (mg):  n/a Dose of Lexiscan: 0.4 mg  Dose of Atropine (mg): n/a Dose of Dobutamine: n/a mcg/kg/min (at max HR)  Stress Test Technologist: Irean Hong, RN  Nuclear Technologist:  Domenic Polite, CNMT     Rest Procedure:  Myocardial perfusion imaging was performed at rest 45 minutes  following the intravenous administration of Technetium 58m Sestamibi. Rest ECG: Sinus Bradycardia, 1st AVB, and RBBB, LVH  Stress Procedure:  The patient received IV Lexiscan 0.4 mg over 15-seconds.  Technetium 92m Sestamibi injected at 30-seconds.  The EKG was non diagnostic due to baseline changes. The patient complained of chest tightness with Lexiscan. There was a persistent 2nd AVB type-2 till 12 minutes in recovery, with symptom of fatique. Dr Riley Kill reviewed EKG strips with order to get 2nd images, and then repeat EKG. Quantitative spect images were obtained after a 45 minute delay.  Aminophylline 75 mg IVP given due to persistent 2nd AVB in recovery. Images and repeated EKG  was reviewed with Dr. Riley Kill with ok for the patient to leave, but can not drive till after pacemaker insertion. Stress ECG: There is no definite change in the QRS. There is persistent 2-1 block after the infusion.  QPS Raw Data Images:  Patient motion noted; appropriate software correction applied. Stress Images:  Normal homogeneous uptake in all areas of the myocardium. Rest Images:  Normal homogeneous uptake in all areas of the myocardium. Subtraction (SDS):  No evidence of ischemia. Transient Ischemic Dilatation (Normal <1.22):  1.07 Lung/Heart Ratio (Normal <0.45):  0.38  Quantitative Gated Spect Images QGS EDV:  117 ml QGS ESV:  45 ml  Impression Exercise Capacity:  Lexiscan with no exercise. BP Response:  Normal blood  pressure response. Clinical Symptoms:  chest tightness ECG Impression:  There was no change in the QRS. See additional comments below Comparison with Prior Nuclear Study: No previous nuclear study performed  Overall Impression:  This study reveals no scar or ischemia. There is normal wall motion. After the infusion the patient appears to have a type of 2:1 conduction. This persisted Until 12 minutes into recovery. Everything was reviewed and it was felt that it was safe for the patient to  go home. The strips are to be reviewed by Dr. Graciela Husbands in the near future.  LV Ejection Fraction: 62%.  LV Wall Motion:  Normal Wall Motion.  Willa Rough, MD

## 2012-07-23 ENCOUNTER — Telehealth (HOSPITAL_COMMUNITY): Payer: Self-pay

## 2012-07-23 NOTE — Telephone Encounter (Signed)
Dr. Graciela Husbands reviewed  EKG strips from [facility] done  on 07-21-12. Dr. Graciela Husbands wants patient to keep scheduled appointment on 08-03-12 for pacemaker insertion, and to go to the ED if any symptoms prior the procedure. No driving. Attempted to call the patient in regards to recommendation from Dr. Graciela Husbands, but unable to reach the patient at the home, work, or emergency contact number. Irean Hong, RN.

## 2012-07-28 ENCOUNTER — Telehealth (HOSPITAL_COMMUNITY): Payer: Self-pay

## 2012-07-28 NOTE — Telephone Encounter (Signed)
Spoke with Robert Flynn this am. He is doing fine,and is not driving. Patient told to keep appointment for 08-03-12 for pacemaker insertion per Dr Graciela Husbands. Irean Hong, RN.

## 2012-07-29 ENCOUNTER — Encounter (HOSPITAL_COMMUNITY): Payer: Self-pay | Admitting: Pharmacy Technician

## 2012-07-30 ENCOUNTER — Other Ambulatory Visit (INDEPENDENT_AMBULATORY_CARE_PROVIDER_SITE_OTHER): Payer: No Typology Code available for payment source

## 2012-07-30 DIAGNOSIS — I498 Other specified cardiac arrhythmias: Secondary | ICD-10-CM

## 2012-07-30 DIAGNOSIS — R001 Bradycardia, unspecified: Secondary | ICD-10-CM

## 2012-07-30 DIAGNOSIS — Z01810 Encounter for preprocedural cardiovascular examination: Secondary | ICD-10-CM

## 2012-07-30 LAB — BASIC METABOLIC PANEL
Calcium: 8.8 mg/dL (ref 8.4–10.5)
GFR: 61.64 mL/min (ref >60.00)
Glucose, Bld: 112 mg/dL — ABNORMAL HIGH (ref 70–99)
Sodium: 140 mEq/L (ref 135–145)

## 2012-07-30 LAB — CBC WITH DIFFERENTIAL/PLATELET
Basophils Absolute: 0.1 10*3/uL (ref 0.0–0.1)
Hemoglobin: 12.8 g/dL — ABNORMAL LOW (ref 13.0–17.0)
Lymphocytes Relative: 35.6 % (ref 12.0–46.0)
Monocytes Relative: 6.4 % (ref 3.0–12.0)
Neutro Abs: 3 10*3/uL (ref 1.4–7.7)
Neutrophils Relative %: 54.7 % (ref 43.0–77.0)
RDW: 14.6 % (ref 11.5–14.6)

## 2012-07-30 LAB — PROTIME-INR
INR: 1.2 ratio — ABNORMAL HIGH (ref 0.8–1.0)
Prothrombin Time: 13 s — ABNORMAL HIGH (ref 10.2–12.4)

## 2012-07-31 ENCOUNTER — Other Ambulatory Visit: Payer: Self-pay | Admitting: *Deleted

## 2012-07-31 DIAGNOSIS — E876 Hypokalemia: Secondary | ICD-10-CM

## 2012-07-31 MED ORDER — POTASSIUM CHLORIDE ER 10 MEQ PO TBCR: 10.0000 meq | EXTENDED_RELEASE_TABLET | Freq: Every day | ORAL | Status: DC

## 2012-08-02 MED ORDER — CEFAZOLIN SODIUM-DEXTROSE 2-3 GM-% IV SOLR
2.0000 g | INTRAVENOUS | Status: DC
  Filled 2012-08-02: qty 50

## 2012-08-02 MED ORDER — SODIUM CHLORIDE 0.9 % IR SOLN
80.0000 mg | Status: DC
  Filled 2012-08-02: qty 2

## 2012-08-03 ENCOUNTER — Encounter (HOSPITAL_COMMUNITY): Payer: Self-pay | Admitting: *Deleted

## 2012-08-03 ENCOUNTER — Encounter (HOSPITAL_COMMUNITY)
Admission: RE | Disposition: A | Discharge status: Home / Self Care | Payer: Self-pay | Source: Ambulatory Visit | Attending: Internal Medicine

## 2012-08-03 ENCOUNTER — Ambulatory Visit (HOSPITAL_COMMUNITY): Payer: No Typology Code available for payment source

## 2012-08-03 ENCOUNTER — Ambulatory Visit (HOSPITAL_COMMUNITY)
Admission: RE | Admit: 2012-08-03 | Discharge: 2012-08-04 | Disposition: A | Discharge status: Home / Self Care | Payer: No Typology Code available for payment source | Source: Ambulatory Visit | Attending: Internal Medicine | Admitting: Internal Medicine

## 2012-08-03 DIAGNOSIS — I441 Atrioventricular block, second degree: Secondary | ICD-10-CM

## 2012-08-03 DIAGNOSIS — I10 Essential (primary) hypertension: Secondary | ICD-10-CM | POA: Insufficient documentation

## 2012-08-03 DIAGNOSIS — E876 Hypokalemia: Secondary | ICD-10-CM

## 2012-08-03 DIAGNOSIS — E669 Obesity, unspecified: Secondary | ICD-10-CM | POA: Insufficient documentation

## 2012-08-03 DIAGNOSIS — I452 Bifascicular block: Secondary | ICD-10-CM | POA: Insufficient documentation

## 2012-08-03 HISTORY — PX: PERMANENT PACEMAKER INSERTION: SHX5480

## 2012-08-03 LAB — SURGICAL PCR SCREEN
MRSA, PCR: NEGATIVE
Staphylococcus aureus: NEGATIVE

## 2012-08-03 SURGERY — PERMANENT PACEMAKER INSERTION
Anesthesia: LOCAL

## 2012-08-03 MED ORDER — FENTANYL CITRATE 0.05 MG/ML IJ SOLN
INTRAMUSCULAR | Status: AC
  Filled 2012-08-03: qty 2

## 2012-08-03 MED ORDER — SODIUM CHLORIDE 0.9 % IV SOLN
INTRAVENOUS | Status: AC
  Administered 2012-08-03: 16:00:00 via INTRAVENOUS

## 2012-08-03 MED ORDER — HYDROCHLOROTHIAZIDE 12.5 MG PO CAPS
12.5000 mg | ORAL_CAPSULE | Freq: Two times a day (BID) | ORAL | Status: DC
  Administered 2012-08-03 – 2012-08-04 (×2): 12.5 mg via ORAL
  Filled 2012-08-03 (×3): qty 1

## 2012-08-03 MED ORDER — MUPIROCIN 2 % EX OINT
TOPICAL_OINTMENT | Freq: Two times a day (BID) | CUTANEOUS | Status: DC
  Administered 2012-08-03: 1 via NASAL
  Administered 2012-08-03: 22:00:00 via NASAL
  Administered 2012-08-04: 1 via NASAL
  Filled 2012-08-03: qty 22

## 2012-08-03 MED ORDER — LISINOPRIL 20 MG PO TABS
20.0000 mg | ORAL_TABLET | Freq: Two times a day (BID) | ORAL | Status: DC
  Administered 2012-08-03 – 2012-08-04 (×2): 20 mg via ORAL
  Filled 2012-08-03 (×3): qty 1

## 2012-08-03 MED ORDER — POTASSIUM CHLORIDE CRYS ER 20 MEQ PO TBCR
40.0000 meq | EXTENDED_RELEASE_TABLET | Freq: Once | ORAL | Status: AC
  Administered 2012-08-03: 40 meq via ORAL
  Filled 2012-08-03: qty 2

## 2012-08-03 MED ORDER — SODIUM CHLORIDE 0.9 % IJ SOLN: 3.0000 mL | INTRAMUSCULAR | Status: DC | PRN

## 2012-08-03 MED ORDER — CEFAZOLIN SODIUM 1-5 GM-% IV SOLN
1.0000 g | Freq: Four times a day (QID) | INTRAVENOUS | Status: AC
  Administered 2012-08-03 – 2012-08-04 (×3): 1 g via INTRAVENOUS
  Filled 2012-08-03 (×3): qty 50

## 2012-08-03 MED ORDER — HEPARIN (PORCINE) IN NACL 2-0.9 UNIT/ML-% IJ SOLN
INTRAMUSCULAR | Status: AC
  Filled 2012-08-03: qty 500

## 2012-08-03 MED ORDER — ACETAMINOPHEN 325 MG PO TABS: 325.0000 mg | ORAL_TABLET | ORAL | Status: DC | PRN

## 2012-08-03 MED ORDER — LIDOCAINE HCL (PF) 1 % IJ SOLN
INTRAMUSCULAR | Status: AC
  Filled 2012-08-03: qty 60

## 2012-08-03 MED ORDER — SODIUM CHLORIDE 0.9 % IV SOLN: 250.0000 mL | INTRAVENOUS | Status: DC

## 2012-08-03 MED ORDER — COLCHICINE 0.6 MG PO TABS
0.6000 mg | ORAL_TABLET | Freq: Two times a day (BID) | ORAL | Status: DC
  Administered 2012-08-03 – 2012-08-04 (×2): 0.6 mg via ORAL
  Filled 2012-08-03 (×3): qty 1

## 2012-08-03 MED ORDER — POTASSIUM CHLORIDE ER 10 MEQ PO TBCR
10.0000 meq | EXTENDED_RELEASE_TABLET | Freq: Every day | ORAL | Status: DC
  Administered 2012-08-04: 10 meq via ORAL
  Filled 2012-08-03 (×2): qty 1

## 2012-08-03 MED ORDER — SODIUM CHLORIDE 0.45 % IV SOLN
INTRAVENOUS | Status: DC
  Administered 2012-08-03: 11:00:00 via INTRAVENOUS

## 2012-08-03 MED ORDER — ONDANSETRON HCL 4 MG/2ML IJ SOLN: 4.0000 mg | Freq: Four times a day (QID) | INTRAMUSCULAR | Status: DC | PRN

## 2012-08-03 MED ORDER — MIDAZOLAM HCL 2 MG/2ML IJ SOLN
INTRAMUSCULAR | Status: AC
  Filled 2012-08-03: qty 2

## 2012-08-03 MED ORDER — ASPIRIN EC 81 MG PO TBEC
81.0000 mg | DELAYED_RELEASE_TABLET | Freq: Every day | ORAL | Status: DC
  Administered 2012-08-04: 81 mg via ORAL
  Filled 2012-08-03: qty 1

## 2012-08-03 MED ORDER — LISINOPRIL-HYDROCHLOROTHIAZIDE 20-12.5 MG PO TABS: 1.0000 | ORAL_TABLET | Freq: Two times a day (BID) | ORAL | Status: DC

## 2012-08-03 MED ORDER — AMLODIPINE BESYLATE 10 MG PO TABS
10.0000 mg | ORAL_TABLET | Freq: Every day | ORAL | Status: DC
  Administered 2012-08-04: 10 mg via ORAL
  Filled 2012-08-03: qty 1

## 2012-08-03 MED ORDER — CHLORHEXIDINE GLUCONATE 4 % EX LIQD
60.0000 mL | Freq: Once | CUTANEOUS | Status: DC
  Filled 2012-08-03: qty 60

## 2012-08-03 MED ORDER — MUPIROCIN 2 % EX OINT
TOPICAL_OINTMENT | CUTANEOUS | Status: AC
  Administered 2012-08-03: 1 via NASAL
  Filled 2012-08-03: qty 22

## 2012-08-03 MED ORDER — SODIUM CHLORIDE 0.9 % IJ SOLN: 3.0000 mL | Freq: Two times a day (BID) | INTRAMUSCULAR | Status: DC

## 2012-08-03 NOTE — H&P (View-Only) (Signed)
 ELECTROPHYSIOLOGY CONSULT NOTE  Patient ID: Robert Flynn, MRN: [id number], DOB/AGE: 03/23/1946 66 y.o. Admit date: (Not on file) Date of Consult: 07/09/2012  Primary Physician: HAIRFORD, AMBER, MD Primary Cardiologist: PN   Chief Complaint:     HPI Robert Flynn is a 66 y.o. male  seen at the request of Dr. PN because of documented bradycardia as well as exercise associated worsening of heart block. He has underlying bifascicular block with first degree AV block and sinus bradycardia. Was admitted for treadmill testing at 1 minute into stage II his sinus rate was 150 and his ventricular response rate was 50.  He has noted worsening of exercise intolerance with some lightheadedness. He has not had chest pain.  He has had no constitutional symptoms. He denies tick bites. There is no family history. Further evaluation included an echo which demonstrated normal left ventricular function mild dilatation of the ACE inhibitor  Past Medical History  Diagnosis Date  . Gout   . Hypertension   . OBESITY, NOS   . ANXIETY   . ADJUSTMENT DISORDER WITHOUT DEPRESSED MOOD   . EXTERNAL HEMORRHOIDS   . OSTEOARTHRITIS, MULTI SITES   . CARDIAC MURMUR   . Routine adult health maintenance   . Bradycardia       Surgical History: No past surgical history on file.   Home Meds: Prior to Admission medications   Medication Sig Start Date End Date Taking? Authorizing Provider  amLODipine (NORVASC) 10 MG tablet Take 1 tablet (10 mg total) by mouth daily. 03/25/12  Yes Amber M Hairford, MD  aspirin 81 MG tablet Take 81 mg by mouth daily.     Yes Historical Provider, MD  colchicine 0.6 MG tablet Take two tablets by mouth two times a day for gout flare 03/25/12  Yes Amber M Hairford, MD  lisinopril-hydrochlorothiazide (PRINZIDE,ZESTORETIC) 20-12.5 MG per tablet Take 1 tablet by mouth 2 (two) times daily. 03/25/12  Yes Amber M Hairford, MD  tadalafil (CIALIS) 20 MG tablet Take 1 tablet (20 mg total) by  mouth daily as needed. 03/25/12  Yes Amber M Hairford, MD    Allergies: No Known Allergies  History   Social History  . Marital Status: Married    Spouse Name: [name]    Number of Children: 5  . Years of Education: 9   Occupational History  . Retired- [employer]     Social History Main Topics  . Smoking status: Never Smoker   . Smokeless tobacco: Never Used  . Alcohol Use: 1.0 oz/week    2 drink(s) per week  . Drug Use: No  . Sexually Active: Not on file   Other Topics Concern  . Not on file   Social History Narrative   Health Care POA: Emergency Contact: Son, James Jr. End of Life Plan: Patient does not have advance directivesWho lives with you: WifeAny pets: noneDiet: Patient eats very little but diet is varied.  Eat an apple a day.Exercise: Patient does not have regular exercise routine but reports walking a few times a week. Seatbelts: Patient reports wearing seat belt when in vehicle. Hobbies: fishing     Family History  Problem Relation Age of Onset  . Heart disease Brother      ROS:  Please see the history of present illness.    All other systems reviewed and negative.    Physical Exam:  *Blood pressure 151/64, pulse 42, height 5' 9" (1.753 m), weight 196 lb (88.905 kg). General: Well developed, well nourished   male in no acute distress. Head: Normocephalic, atraumatic, sclera non-icteric, no xanthomas, nares are without discharge. Lymph Nodes:  none Back: without scoliosis/kyphosis , no CVA tendersness Neck: Negative for carotid bruits. JVD not elevated. Lungs: Clear bilaterally to auscultation without wheezes, rales, or rhonchi. Breathing is unlabored. Heart: RRR with S1 S2.2/6 systolic murmur , rubs, or gallops appreciated. Abdomen: Soft, non-tender, non-distended with normoactive bowel sounds. No hepatomegaly. No rebound/guarding. No obvious abdominal masses. Msk:  Strength and tone appear normal for age. Extremities: No clubbing or cyanosis. No  edema.   Distal pedal pulses are 2+ and equal bilaterally. Skin: Warm and Dry Neuro: Alert and oriented X 3. CN III-XII intact Grossly normal sensory and motor function . Psych:  Responds to questions appropriately with a normal affect.      Labs: Cardiac Enzymes No results found for this basename: CKTOTAL:4,CKMB:4,TROPONINI:4 in the last 72 hours CBC Lab Results  Component Value Date   WBC 4.6 03/25/2012   HGB 12.7* 03/25/2012   HCT 36.3* 03/25/2012   MCV 85.0 03/25/2012   PLT 214 03/25/2012   PROTIME: No results found for this basename: LABPROT:3,INR:3 in the last 72 hours Chemistry No results found for this basename: NA,K,CL,CO2,BUN,CREATININE,CALCIUM,LABALBU,PROT,BILITOT,ALKPHOS,ALT,AST,GLUCOSE in the last 168 hours Lipids Lab Results  Component Value Date   CHOL 143 03/25/2012   HDL 30* 03/25/2012   LDLCALC 99 03/25/2012   TRIG 68 03/25/2012   BNP No results found for this basename: probnp   Miscellaneous No results found for this basename: DDIMER    Radiology/Studies:  No results found.  EKG:  Sinus rhythm at 59 Intervals 0.20/13/44 Access left a -51 Q waves V1 V2   Assessment and Plan:   Steven Klein   

## 2012-08-03 NOTE — Interval H&P Note (Signed)
History and Physical Interval Note:  08/03/2012 1:55 PM  Robert Flynn  has presented today for surgery, with the diagnosis of bradycardia  The various methods of treatment have been discussed with the patient and family. After consideration of risks, benefits and other options for treatment, the patient has consented to  Procedure(s) (LRB) with comments: PERMANENT PACEMAKER INSERTION (N/A) as a surgical intervention .  The patient's history has been reviewed, patient examined, no change in status, stable for surgery.  I have reviewed the patient's chart and labs.  Questions were answered to the patient's satisfaction.     [name]

## 2012-08-03 NOTE — CV Procedure (Signed)
Preop DX:: high grade heart block Post op DX:: same  Procedure  dual pacemaker implantation  After routine prep and drape, lidocaine was infiltrated in the prepectoral subclavicular region on the left side an incision was made and carried down to later the prepectoral fascia using electrocautery and sharp dissection a pocket was formed similarly. Hemostasis was obtained.  After this, we turned our attention to gaining accessm to the extrathoracic,left subclavian vein. This was accomplished without difficulty and without the aspiration of air or puncture of the artery.  1  Venipuncture was accomplished;and a double wire technique was used  2 guidewires were placed and retained and sequentially 7 French sheath through which were  passed a [employer] 2088  ventricular lead serial numberCAW [id number] and an [facility] 2088 atrial lead serial number [id number] .  The ventricular lead was manipulated to the right ventricular apex with a bipolar R wave was 7.2, the pacing impedance was 723, the threshold was 0.7 @ 0.4 msec  Current at threshold was   1.0  Ma and the current of injury was  brisk.  The right atrial lead was manipulated to the right atrial appendage with a bipolar P-wave  4.4, the pacing impedance was 608, the threshold 0.7@ 0.4 msec   Current at threshold was 1.0   Ma and the current of injury was brisk.  The ventricular lead was marked with a tie prior to the insertion of the atrial lead. The leads were affixed to the prepectoral fascia and attached to a  [employer] [id number] pulse generator serial number [id number].  Hemostasis was obtained. The pocket was copiously irrigated with antibiotic containing saline solution. The leads and the pulse generator were placed in the pocket and affixed to the prepectoral fascia. The wound was then closed in 3 layers in the normal fashion. The wound was washed dried and a benzoin Steri-Strip dressing was applied.  Needle  Count, sponge counts and instrument counts were  correct at the end of the procedure .   The patient tolerated the procedure with   Aspiration of air on 2 occassions.  No clinical evidence of pneumothorax  Gerlene Burdock.D.

## 2012-08-03 NOTE — Progress Notes (Signed)
CXR >> no pneumothorax Clinically stable  Repeat cxr at 1900 h

## 2012-08-04 ENCOUNTER — Ambulatory Visit (HOSPITAL_COMMUNITY): Payer: No Typology Code available for payment source

## 2012-08-04 ENCOUNTER — Encounter: Payer: Self-pay | Admitting: *Deleted

## 2012-08-04 DIAGNOSIS — Z950 Presence of cardiac pacemaker: Secondary | ICD-10-CM | POA: Insufficient documentation

## 2012-08-04 DIAGNOSIS — I10 Essential (primary) hypertension: Secondary | ICD-10-CM

## 2012-08-04 LAB — BASIC METABOLIC PANEL
BUN: 13 mg/dL (ref 6–23)
CO2: 27 mEq/L (ref 19–32)
Calcium: 9.4 mg/dL (ref 8.4–10.5)
Creatinine, Ser: 1.18 mg/dL (ref 0.50–1.35)
Glucose, Bld: 172 mg/dL — ABNORMAL HIGH (ref 70–99)

## 2012-08-04 MED ORDER — POTASSIUM CHLORIDE ER 10 MEQ PO TBCR: 20.0000 meq | EXTENDED_RELEASE_TABLET | Freq: Every day | ORAL | Status: DC

## 2012-08-04 NOTE — Progress Notes (Signed)
Reviewed discharge instructions with patient and family. Removed pacemaker dressing and instruction patient about care after discharge. Patient escorted out via wheelchair.  Robert Flynn, [name]

## 2012-08-04 NOTE — Discharge Summary (Signed)
ELECTROPHYSIOLOGY PROCEDURE DISCHARGE SUMMARY    Patient ID: Robert Flynn,  MRN: [id number], DOB/AGE: Sep 20, 1945 66 y.o.  Admit date: 08/03/2012 Discharge date: 08/21/2012  Primary Care Physician: Rodman Pickle Primary Cardiologist: Charlton Haws, MD Electrophysiologist: [name], MD  Primary Discharge Diagnosis:  Symptomatic bradycardia and exercise associated progressive heart block status post pacemaker implantation this admission.   Secondary Discharge Diagnosis:  1.  Gout 2.  Hypertension 3.  Obesity 4.  Anxiety 5.  Osteoarthritis   Procedures This Admission:  1.  Implantation of a dual chamber pacemaker on 08-03-2012 by Dr Graciela Husbands.  The patient received a St Jude model number 2110 pacemaker with model number 2088 right atrial and right ventricular leads.  The procedure was notable for aspiration of air on two occasions.  2.  Follow up chest x-rays (3 in total) have demonstrated no pneumothorax status post device implantation.   Brief HPI: Robert Flynn is a 66 y.o. male referred to Dr Graciela Husbands in the outpatient setting because of documented bradycardia as well as exercise associated worsening of heart block. He has underlying bifascicular block with first degree AV block and sinus bradycardia. Was admitted for treadmill testing at 1 minute into stage II his sinus rate was 150 and his ventricular response rate was 50. He has noted worsening of exercise intolerance with some lightheadedness. He has not had chest pain. He has had no constitutional symptoms. He denies tick bites. There is no family history. Further evaluation included an echo which demonstrated normal left ventricular function.  Risks, benefits, and alternatives to pacemaker implantation were reviewed with the patient who wished to proceed.    Hospital Course:  The patient was admitted and underwent implantation of a dual chamber pacemaker with details as outlined above.   He was monitored on telemetry  overnight which demonstrated P synchronous pacing.  Left chest was without hematoma or ecchymosis.  The device was interrogated and found to be functioning normally.  CXR was obtained and demonstrated no pneumothorax status post device implantation.  Wound care, arm mobility, and restrictions were reviewed with the patient.  Dr Graciela Husbands examined the patient and considered them stable for discharge to home.    Discharge Vitals: Blood pressure 132/79, pulse 86, temperature 98.6 F (37 C), temperature source Oral, resp. rate 18, height 5\' 9"  (1.753 m), weight 194 lb (87.998 kg), SpO2 98.00%.    Labs:   Lab Results  Component Value Date   WBC 5.5 07/30/2012   HGB 12.8* 07/30/2012   HCT 39.2 07/30/2012   MCV 90.4 07/30/2012   PLT 159.0 Result may be falsely decreased due to platelet clumping. 07/30/2012   No results found for this basename: NA,K,CL,CO2,BUN,CREATININE,CALCIUM,LABALBU,PROT,BILITOT,ALKPHOS,ALT,AST,GLUCOSE in the last 168 hours  Discharge Medications:    Medication List     As of 08/21/2012  7:25 AM    TAKE these medications         amLODipine 10 MG tablet   Commonly known as: NORVASC   Take 1 tablet (10 mg total) by mouth daily.      aspirin 81 MG tablet   Take 81 mg by mouth daily.      colchicine 0.6 MG tablet   Take 0.6 mg by mouth 2 (two) times daily.      lisinopril-hydrochlorothiazide 20-12.5 MG per tablet   Commonly known as: PRINZIDE,ZESTORETIC   Take 1 tablet by mouth 2 (two) times daily.      potassium chloride 10 MEQ tablet   Commonly known as:  K-DUR   Take 2 tablets (20 mEq total) by mouth daily.      tadalafil 20 MG tablet   Commonly known as: CIALIS   Take 1 tablet (20 mg total) by mouth daily as needed.         Disposition:  Discharge Orders    Future Appointments: Provider: Department: Dept Phone: Center:   09/01/2012 9:30 AM Rosalio Macadamia, NP [facility] Main Office [place]) [phone] [facility]   11/17/2012 10:15 AM Duke Salvia, MD [facility] Main Office [place]) [phone] [facility]     Follow-up Information    Follow up with [facility]. On 08/12/2012. (2PM)    Contact information:   [address] [place] [phone]      Follow up with [name], MD. On 11/17/2012. (10:15 AM)    Contact information:   [address] [place] [address] [phone]          Duration of Discharge Encounter: Greater than 30 minutes including physician time.  Signed, Gypsy Balsam, RN, BSN 08/21/2012, 7:25 AM

## 2012-08-04 NOTE — Progress Notes (Signed)
  Patient Name: Robert Flynn      SUBJECTIVE: feels good this am  Past Medical History  Diagnosis Date  . Gout   . Hypertension   . OBESITY, NOS   . ANXIETY   . ADJUSTMENT DISORDER WITHOUT DEPRESSED MOOD   . EXTERNAL HEMORRHOIDS   . OSTEOARTHRITIS, MULTI SITES   . CARDIAC MURMUR   . Sinus bradycardia   . Right bundle branch block and left anterior fascicular block     Progressive heart block with exercise>>3:1    PHYSICAL EXAM Filed Vitals:   08/04/12 0300 08/04/12 0409 08/04/12 0500 08/04/12 0700  BP: 135/80  130/90 128/87  Pulse:      Temp:  97.6 F (36.4 C)    TempSrc:  Oral    Resp:      Height:      Weight:      SpO2:  99%     Well developed and nourished in no acute distress HENT normal Neck supple   Device pocket   without hematoma or erythema   Clear Regular rate and rhythm, no murmurs or gallops Abd-soft  No Clubbing cyanosis edema Skin-warm and dry A & Oriented  Grossly normal sensory and motor function  Tel P-synchronous/ AV  pacing    Intake/Output Summary (Last 24 hours) at 08/04/12 0721 Last data filed at 08/04/12 0212  Gross per 24 hour  Intake    450 ml  Output    750 ml  Net   -300 ml    LABS: Basic Metabolic Panel:  Lab 07/30/12 0981  NA 140  K 3.3*  CL 103  CO2 29  GLUCOSE 112*  BUN 14  CREATININE 1.5  CALCIUM 8.8  MG --  PHOS --   Cardiac Enzymes: No results found for this basename: CKTOTAL:3,CKMB:3,CKMBINDEX:3,TROPONINI:3 in the last 72 hours CBC:  Lab 07/30/12 0846  WBC 5.5  NEUTROABS 3.0  HGB 12.8*  HCT 39.2  MCV 90.4  PLT 159.0 Result may be falsely decreased due to platelet clumping.   Device interrogation  reprogrammng of PVAB 2/2 false mode switch   Device Interrogation:   ASSESSMENT AND PLAN: Pacer for high grade block No pneumothorax evident on xray Hypokalemia noted yesterday>> repleted Will recheck prior to dsicharge Signed, [name] MD  08/04/2012

## 2012-08-10 ENCOUNTER — Other Ambulatory Visit: Payer: Self-pay | Admitting: *Deleted

## 2012-08-10 ENCOUNTER — Ambulatory Visit (INDEPENDENT_AMBULATORY_CARE_PROVIDER_SITE_OTHER): Payer: No Typology Code available for payment source | Admitting: *Deleted

## 2012-08-10 ENCOUNTER — Encounter: Payer: Self-pay | Admitting: Internal Medicine

## 2012-08-10 ENCOUNTER — Other Ambulatory Visit: Payer: Self-pay | Admitting: Cardiology

## 2012-08-10 ENCOUNTER — Encounter (INDEPENDENT_AMBULATORY_CARE_PROVIDER_SITE_OTHER): Payer: No Typology Code available for payment source | Admitting: *Deleted

## 2012-08-10 ENCOUNTER — Encounter (INDEPENDENT_AMBULATORY_CARE_PROVIDER_SITE_OTHER): Payer: No Typology Code available for payment source

## 2012-08-10 DIAGNOSIS — R001 Bradycardia, unspecified: Secondary | ICD-10-CM

## 2012-08-10 DIAGNOSIS — R0989 Other specified symptoms and signs involving the circulatory and respiratory systems: Secondary | ICD-10-CM

## 2012-08-10 DIAGNOSIS — I808 Phlebitis and thrombophlebitis of other sites: Secondary | ICD-10-CM

## 2012-08-10 DIAGNOSIS — R52 Pain, unspecified: Secondary | ICD-10-CM

## 2012-08-10 DIAGNOSIS — R609 Edema, unspecified: Secondary | ICD-10-CM

## 2012-08-10 DIAGNOSIS — I498 Other specified cardiac arrhythmias: Secondary | ICD-10-CM

## 2012-08-10 DIAGNOSIS — M79609 Pain in unspecified limb: Secondary | ICD-10-CM

## 2012-08-10 DIAGNOSIS — Z950 Presence of cardiac pacemaker: Secondary | ICD-10-CM

## 2012-08-10 DIAGNOSIS — M7989 Other specified soft tissue disorders: Secondary | ICD-10-CM

## 2012-08-10 LAB — PACEMAKER DEVICE OBSERVATION
AL IMPEDENCE PM: 412.5 Ohm
ATRIAL PACING PM: 13
BAMS-0003: 70 {beats}/min
RV LEAD IMPEDENCE PM: 575 Ohm
RV LEAD THRESHOLD: 0.75 V
VENTRICULAR PACING PM: 100

## 2012-08-10 MED ORDER — RIVAROXABAN 20 MG PO TABS: 20.0000 mg | ORAL_TABLET | Freq: Every day | ORAL | Status: DC

## 2012-08-10 NOTE — Progress Notes (Signed)
Wound check-PPM 

## 2012-08-12 ENCOUNTER — Ambulatory Visit: Payer: No Typology Code available for payment source

## 2012-08-16 DIAGNOSIS — I452 Bifascicular block: Secondary | ICD-10-CM

## 2012-08-16 HISTORY — PX: PACEMAKER INSERTION: SHX728

## 2012-08-16 HISTORY — DX: Bifascicular block: I45.2

## 2012-09-01 ENCOUNTER — Encounter: Payer: Self-pay | Admitting: Nurse Practitioner

## 2012-09-01 ENCOUNTER — Ambulatory Visit (INDEPENDENT_AMBULATORY_CARE_PROVIDER_SITE_OTHER): Payer: No Typology Code available for payment source | Admitting: Nurse Practitioner

## 2012-09-01 VITALS — BP 136/72 | HR 60 | Ht 69.5 in | Wt 194.6 lb

## 2012-09-01 DIAGNOSIS — I82409 Acute embolism and thrombosis of unspecified deep veins of unspecified lower extremity: Secondary | ICD-10-CM

## 2012-09-01 DIAGNOSIS — Z950 Presence of cardiac pacemaker: Secondary | ICD-10-CM

## 2012-09-01 LAB — CBC WITH DIFFERENTIAL/PLATELET
Basophils Absolute: 0 10*3/uL (ref 0.0–0.1)
Basophils Relative: 0.3 % (ref 0.0–3.0)
Eosinophils Absolute: 0.1 10*3/uL (ref 0.0–0.7)
Eosinophils Relative: 2.7 % (ref 0.0–5.0)
HCT: 38.8 % — ABNORMAL LOW (ref 39.0–52.0)
Hemoglobin: 12.8 g/dL — ABNORMAL LOW (ref 13.0–17.0)
Lymphocytes Relative: 40.6 % (ref 12.0–46.0)
Lymphs Abs: 1.9 10*3/uL (ref 0.7–4.0)
MCHC: 32.9 g/dL (ref 30.0–36.0)
MCV: 89.7 fl (ref 78.0–100.0)
Monocytes Absolute: 0.3 10*3/uL (ref 0.1–1.0)
Monocytes Relative: 6.6 % (ref 3.0–12.0)
Neutro Abs: 2.4 10*3/uL (ref 1.4–7.7)
Neutrophils Relative %: 49.8 % (ref 43.0–77.0)
Platelets: 177 10*3/uL (ref 150.0–400.0)
RBC: 4.32 Mil/uL (ref 4.22–5.81)
RDW: 14.2 % (ref 11.5–14.6)
WBC: 4.8 10*3/uL (ref 4.5–10.5)

## 2012-09-01 LAB — BASIC METABOLIC PANEL
BUN: 17 mg/dL (ref 6–23)
CO2: 30 mEq/L (ref 19–32)
Calcium: 9.3 mg/dL (ref 8.4–10.5)
Chloride: 104 mEq/L (ref 96–112)
Creatinine, Ser: 1.3 mg/dL (ref 0.4–1.5)
GFR: 68.62 mL/min (ref >60.00)
Glucose, Bld: 116 mg/dL — ABNORMAL HIGH (ref 70–99)
Potassium: 3.5 mEq/L (ref 3.5–5.1)
Sodium: 141 mEq/L (ref 135–145)

## 2012-09-01 NOTE — Progress Notes (Signed)
Robert Flynn Date of Birth: 1945/10/01 Medical Record #[id number]  History of Present Illness: [name] is seen back today for a post hospital visit. He is seen for Dr. Graciela Flynn and Dr. Eden Flynn. He has had symptomatic bradycardia with exercise associated progressive heart block. Now with pacemaker in place. Other issues include HTN, Gout, obesity, OA and anxiety. He did develop a DVT of the right arm and is going to be on Xarelto for 3 months.   He comes in today. He is here alone. Says he is doing well. No real complaints. His arm feels ok. Says he has samples of the Xarelto to last him the entire period. No chest pain. Not short of breath.   Current Outpatient Prescriptions on File Prior to Visit  Medication Sig Dispense Refill  . amLODipine (NORVASC) 10 MG tablet Take 1 tablet (10 mg total) by mouth daily.  30 tablet  6  . aspirin 81 MG tablet Take 81 mg by mouth daily.        . colchicine 0.6 MG tablet Take 0.6 mg by mouth 2 (two) times daily.      Marland Kitchen lisinopril-hydrochlorothiazide (PRINZIDE,ZESTORETIC) 20-12.5 MG per tablet Take 1 tablet by mouth 2 (two) times daily.  60 tablet  6  . potassium chloride (K-DUR) 10 MEQ tablet Take 2 tablets (20 mEq total) by mouth daily.  60 tablet  6  . Rivaroxaban (XARELTO) 20 MG TABS Take 1 tablet (20 mg total) by mouth daily.  30 tablet  0  . tadalafil (CIALIS) 20 MG tablet Take 1 tablet (20 mg total) by mouth daily as needed.  10 tablet  3    No Known Allergies  Past Medical History  Diagnosis Date  . Gout   . Hypertension   . OBESITY, NOS   . ANXIETY   . ADJUSTMENT DISORDER WITHOUT DEPRESSED MOOD   . EXTERNAL HEMORRHOIDS   . OSTEOARTHRITIS, MULTI SITES   . CARDIAC MURMUR   . Sinus bradycardia   . Right bundle branch block and left anterior fascicular block Dec 2013    Progressive heart block with exercise>>3:1; s/p PTVP  . DVT (deep venous thrombosis)     of the right arm post pacemaker; on Xarelto for 3 months    Past Surgical  History  Procedure Date  . Pacemaker insertion December 2013    History  Smoking status  . Never Smoker   Smokeless tobacco  . Never Used    History  Alcohol Use  . 1.0 oz/week  . 2 drink(s) per week    Family History  Problem Relation Age of Onset  . Heart disease Brother     Review of Systems: The review of systems is per the HPI.  All other systems were reviewed and are negative.  Physical Exam: BP 136/72  Pulse 60  Ht 5' 9.5" (1.765 m)  Wt 194 lb 9.6 oz (88.27 kg)  BMI 28.33 kg/m2 Patient is very pleasant and in no acute distress. Skin is warm and dry. Color is normal.  HEENT is unremarkable. Normocephalic/atraumatic. PERRL. Sclera are nonicteric. Neck is supple. No masses. No JVD. Lungs are clear. Cardiac exam shows a regular rate and rhythm. Abdomen is soft. Extremities are without edema. Gait and ROM are intact. No gross neurologic deficits noted.  LABORATORY DATA:  Lab Results  Component Value Date   WBC 5.5 07/30/2012   HGB 12.8* 07/30/2012   HCT 39.2 07/30/2012   PLT 159.0 Result may be falsely decreased due  to platelet clumping. 07/30/2012   GLUCOSE 172* 08/04/2012   CHOL 143 03/25/2012   TRIG 68 03/25/2012   HDL 30* 03/25/2012   LDLDIRECT 103* 01/26/2009   LDLCALC 99 03/25/2012   ALT 13 07/26/2008   AST 16 07/26/2008   NA 139 08/04/2012   K 3.5 08/04/2012   CL 103 08/04/2012   CREATININE 1.18 08/04/2012   BUN 13 08/04/2012   CO2 27 08/04/2012   TSH 0.836 03/25/2012   PSA 0.78 01/26/2009   INR 1.2* 07/30/2012   Dg Chest Portable 1 View  08/04/2012  *RADIOLOGY REPORT*  Clinical Data: Evaluate for pneumothorax  PORTABLE CHEST - 1 VIEW  Comparison: Portable chest x-ray of 08/03/2012  Findings:  The lungs are clear.  No pneumothorax is seen.  No effusion is noted.  Cardiomegaly is stable.  A permanent pacemaker remains.  IMPRESSION: No active lung disease.  No pneumothorax.   Original Report Authenticated By: Dwyane Dee, M.D.     Assessment /  Plan:  1. S/P PTVP for symptomatic bradycardia and exercise associated progressive heart block - doing well. He is to see Dr. Graciela Flynn back in March.   2. HTN - blood pressure looks good.   3. Obesity - encouraged him to walk more.  4. DVT of the right arm post pacer - on Xarelto for 3 months. He was given samples.   Call the [facility] office at [phone] if you have any questions, problems or concerns.

## 2012-09-01 NOTE — Patient Instructions (Addendum)
I think you are doing well.  We are going to check labs today  Stay on your current medicines  Take the Xarelto for a total of 3 months  We will see you back in March  Call the [facility] office at [phone] if you have any questions, problems or concerns.

## 2012-10-15 ENCOUNTER — Ambulatory Visit: Payer: No Typology Code available for payment source | Admitting: Family Medicine

## 2012-11-17 ENCOUNTER — Ambulatory Visit (INDEPENDENT_AMBULATORY_CARE_PROVIDER_SITE_OTHER): Payer: No Typology Code available for payment source | Admitting: Internal Medicine

## 2012-11-17 ENCOUNTER — Encounter: Payer: Self-pay | Admitting: Internal Medicine

## 2012-11-17 VITALS — BP 134/80 | HR 76 | Ht 69.0 in | Wt 199.0 lb

## 2012-11-17 DIAGNOSIS — I498 Other specified cardiac arrhythmias: Secondary | ICD-10-CM

## 2012-11-17 DIAGNOSIS — I10 Essential (primary) hypertension: Secondary | ICD-10-CM

## 2012-11-17 DIAGNOSIS — Z950 Presence of cardiac pacemaker: Secondary | ICD-10-CM

## 2012-11-17 DIAGNOSIS — I442 Atrioventricular block, complete: Secondary | ICD-10-CM

## 2012-11-17 DIAGNOSIS — T82190A Other mechanical complication of cardiac electrode, initial encounter: Secondary | ICD-10-CM

## 2012-11-17 LAB — PACEMAKER DEVICE OBSERVATION
AL THRESHOLD: 0.5 V
ATRIAL PACING PM: 21
BAMS-0003: 70 {beats}/min
DEVICE MODEL PM: 7362469
RV LEAD THRESHOLD: 0.75 V

## 2012-11-17 NOTE — Assessment & Plan Note (Signed)
As above.

## 2012-11-17 NOTE — Assessment & Plan Note (Signed)
Blood pressure well controlled

## 2012-11-17 NOTE — Assessment & Plan Note (Signed)
30% atrial pacing

## 2012-11-17 NOTE — Assessment & Plan Note (Signed)
/  The patient's device was interrogated and the information was fully reviewed.  The device was reprogrammed to   Maximize longevity her to decrease the likelihood of atrial oversensing and inappropriate mode switch

## 2012-11-17 NOTE — Progress Notes (Signed)
Patient Care Team: Hilarie Fredrickson, MD as PCP - General (Family Medicine)   HPI  Robert Flynn is a 67 y.o. male  Seen in followup for pacer implanted 11/13 for symptomatic 2:1-3:1  Heart block  Exercise tolerance is much improved. Is not able to climb stairs. He denies chest pain or edema. Past Medical History  Diagnosis Date  . Gout   . Hypertension   . OBESITY, NOS   . ANXIETY   . ADJUSTMENT DISORDER WITHOUT DEPRESSED MOOD   . EXTERNAL HEMORRHOIDS   . OSTEOARTHRITIS, MULTI SITES   . CARDIAC MURMUR   . Sinus bradycardia   . Right bundle branch block and left anterior fascicular block Dec 2013    Progressive heart block with exercise>>3:1; s/p PTVP  . DVT (deep venous thrombosis)     of the right arm post pacemaker; on Xarelto for 3 months    Past Surgical History  Procedure Laterality Date  . Pacemaker insertion  December 2013    Current Outpatient Prescriptions  Medication Sig Dispense Refill  . amLODipine (NORVASC) 10 MG tablet Take 1 tablet (10 mg total) by mouth daily.  30 tablet  6  . aspirin 81 MG tablet Take 81 mg by mouth daily.        . colchicine 0.6 MG tablet Take 0.6 mg by mouth 2 (two) times daily.      Marland Kitchen lisinopril-hydrochlorothiazide (PRINZIDE,ZESTORETIC) 20-12.5 MG per tablet Take 1 tablet by mouth 2 (two) times daily.  60 tablet  6  . potassium chloride (K-DUR) 10 MEQ tablet Take 2 tablets (20 mEq total) by mouth daily.  60 tablet  6  . Rivaroxaban (XARELTO) 20 MG TABS Take 1 tablet (20 mg total) by mouth daily.  30 tablet  0  . tadalafil (CIALIS) 20 MG tablet Take 1 tablet (20 mg total) by mouth daily as needed.  10 tablet  3   No current facility-administered medications for this visit.    No Known Allergies  Review of Systems negative except from HPI and PMH  Physical Exam BP 134/80 Well developed and well nourished in no acute distress HENT normal E scleral and icterus clear Neck Supple JVP flat; carotids brisk and full Clear to  ausculation Device pocket well healed; without hematoma or erythema Regular rate and rhythm, no murmurs gallops or rub Soft with active bowel sounds No clubbing cyanosis none Edema Alert and oriented, grossly normal motor and sensory function Skin Warm and Dry    Assessment and  Plan

## 2012-11-17 NOTE — Assessment & Plan Note (Signed)
Progressive heart block. Now complete and device dependent. Stable post pacing

## 2012-11-17 NOTE — Patient Instructions (Addendum)
Remote monitoring is used to monitor your Pacemaker of ICD from home. This monitoring reduces the number of office visits required to check your device to one time per year. It allows [place] to keep an eye on the functioning of your device to ensure it is working properly. You are scheduled for a device check from home on 12/21/12. You may send your transmission at any time that day. If you have a wireless device, the transmission will be sent automatically. After your physician reviews your transmission, you will receive a postcard with your next transmission date.  Your physician wants you to follow-up in: 9 months (December 2014). You will receive a reminder letter in the mail two months in advance. If you don't receive a letter, please call our office to schedule the follow-up appointment.  Your physician recommends that you continue on your current medications as directed. Please refer to the Current Medication list given to you today.

## 2012-11-19 ENCOUNTER — Other Ambulatory Visit: Payer: Self-pay | Admitting: Family Medicine

## 2012-12-21 ENCOUNTER — Encounter: Payer: No Typology Code available for payment source | Admitting: *Deleted

## 2012-12-23 ENCOUNTER — Encounter: Payer: Self-pay | Admitting: *Deleted

## 2013-01-25 ENCOUNTER — Other Ambulatory Visit: Payer: Self-pay | Admitting: Family Medicine

## 2013-01-30 ENCOUNTER — Other Ambulatory Visit: Payer: Self-pay | Admitting: Family Medicine

## 2013-02-05 ENCOUNTER — Encounter: Payer: Self-pay | Admitting: Family Medicine

## 2013-02-05 ENCOUNTER — Ambulatory Visit (INDEPENDENT_AMBULATORY_CARE_PROVIDER_SITE_OTHER): Payer: No Typology Code available for payment source | Admitting: Family Medicine

## 2013-02-05 VITALS — BP 129/79 | HR 60 | Temp 98.9°F | Ht 69.0 in | Wt 190.0 lb

## 2013-02-05 DIAGNOSIS — R001 Bradycardia, unspecified: Secondary | ICD-10-CM

## 2013-02-05 DIAGNOSIS — I498 Other specified cardiac arrhythmias: Secondary | ICD-10-CM

## 2013-02-05 DIAGNOSIS — I10 Essential (primary) hypertension: Secondary | ICD-10-CM

## 2013-02-05 NOTE — Patient Instructions (Signed)
It was good to see you today! You look great!!!  I will see you back in about 3 months, or sooner if ou need anything!  Kabir Brannock M. Ugochi Henzler, M.D.

## 2013-02-05 NOTE — Assessment & Plan Note (Signed)
S/p pacer placement at [facility] cardiology. Overall, doing well. Will follow up with cardiology as scheduled. Encouraged to keep exercising.

## 2013-02-05 NOTE — Assessment & Plan Note (Signed)
At goal today. Continue Norvasc and Lisinopril-HCTZ. Will f/u in 3 months and will need labs at that time.

## 2013-02-05 NOTE — Progress Notes (Signed)
Patient ID: Robert Flynn, male   DOB: 1946/06/01, 67 y.o.   MRN: [id number]  [facility] Jamerion Cabello M. Laylee Schooley, MD Phone: [phone]   Subjective: HPI: Patient is a 67 y.o. male presenting to clinic today for follow up appointment. Concerns today include none. Does not need refills on any medications at this time  1. Hypertension Blood pressure at home: Does not check at home Blood pressure today: 129/79 Taking Meds: Lisinopril-HCTZ 20-12.5 Side effects: None ROS: Denies headache, visual changes, nausea, vomiting, chest pain, abdominal pain or shortness of breath.  2. Bradycardia Referred to [facility] in Sept 2013, found to have 3:1 heart block on exercise stress testing. Had pacemaker placed in November 2013. Feeling well now. Walks on 30 minutes every morning on treadmill. Has lost about 9 lbs in last few months. Able to climb stairs at home with no problems. No syncope symptoms, or headaches.  3. Health Maintenance - UTD on immunizations and blood work.  No gout flares recently; taking Colcrys as needed   History Reviewed: Never smoker.  ROS: Please see HPI above.  Objective: Office vital signs reviewed. BP 129/79  Pulse 60  Temp(Src) 98.9 F (37.2 C) (Oral)  Ht 5\' 9"  (1.753 m)  Wt 190 lb (86.183 kg)  BMI 28.05 kg/m2  Physical Examination:  General: Awake, alert. NAD. Very pleasant HEENT: Atraumatic, normocephalic. MMM Neck: No masses palpated. No LAD. No bruit appreciated Pulm: CTAB, no wheezes Cardio: well healed incision left upper chest. RRR, soft SM appreciated Abdomen:+BS, soft, nontender, nondistended Extremities: No edema Neuro: Grossly intact  Assessment: 67 y.o. male follow up appointment  Plan: See Problem List and After Visit Summary

## 2013-03-11 ENCOUNTER — Other Ambulatory Visit: Payer: Self-pay | Admitting: Family Medicine

## 2013-03-12 ENCOUNTER — Other Ambulatory Visit: Payer: Self-pay | Admitting: *Deleted

## 2013-03-12 MED ORDER — LISINOPRIL-HYDROCHLOROTHIAZIDE 20-12.5 MG PO TABS: ORAL_TABLET | ORAL | Status: DC

## 2013-03-22 ENCOUNTER — Other Ambulatory Visit: Payer: Self-pay | Admitting: Family Medicine

## 2013-04-12 ENCOUNTER — Encounter: Payer: Self-pay | Admitting: Home Health Services

## 2013-04-28 ENCOUNTER — Other Ambulatory Visit: Payer: Self-pay | Admitting: Family Medicine

## 2013-04-30 ENCOUNTER — Other Ambulatory Visit: Payer: Self-pay | Admitting: Family Medicine

## 2013-05-13 ENCOUNTER — Other Ambulatory Visit: Payer: Self-pay | Admitting: Family Medicine

## 2013-06-01 ENCOUNTER — Encounter: Payer: Self-pay | Admitting: Home Health Services

## 2013-06-04 ENCOUNTER — Other Ambulatory Visit: Payer: Self-pay | Admitting: Internal Medicine

## 2013-06-16 ENCOUNTER — Ambulatory Visit (INDEPENDENT_AMBULATORY_CARE_PROVIDER_SITE_OTHER): Payer: No Typology Code available for payment source | Admitting: Cardiovascular Disease

## 2013-06-16 ENCOUNTER — Encounter: Payer: Self-pay | Admitting: Cardiovascular Disease

## 2013-06-16 ENCOUNTER — Other Ambulatory Visit: Payer: Self-pay | Admitting: Family Medicine

## 2013-06-16 VITALS — BP 170/90 | HR 64 | Ht 69.5 in | Wt 195.0 lb

## 2013-06-16 DIAGNOSIS — Z950 Presence of cardiac pacemaker: Secondary | ICD-10-CM

## 2013-06-16 DIAGNOSIS — I10 Essential (primary) hypertension: Secondary | ICD-10-CM

## 2013-06-16 NOTE — Progress Notes (Signed)
Patient ID: Robert Flynn, male   DOB: Sep 01, 1946, 67 y.o.   MRN: [id number] 67 yo with bradycardia  Had CHB on ETT and received pacer with Dr Graciela Husbands  08/03/12  St Jude dual chamber pacer  Some arthritis in shoulder No palpitations or syncope.  Compliant with BP meds    06/18/12  Normal echo EF 55-60% mild aortic root dilatation no valve disease  08/10/12 normal myovue no ischemia reviewed  ROS: Denies fever, malais, weight loss, blurry vision, decreased visual acuity, cough, sputum, SOB, hemoptysis, pleuritic pain, palpitaitons, heartburn, abdominal pain, melena, lower extremity edema, claudication, or rash.  All other systems reviewed and negative  General: Affect appropriate Healthy:  appears stated age HEENT: normal Neck supple with no adenopathy JVP normal no bruits no thyromegaly Lungs clear with no wheezing and good diaphragmatic motion Heart:  S1/S2 no murmur, no rub, gallop or click PMI normal Abdomen: benighn, BS positve, no tenderness, no AAA no bruit.  No HSM or HJR Distal pulses intact with no bruits No edema Neuro non-focal Skin warm and dry No muscular weakness Pacer under left clavicle in good position    Current Outpatient Prescriptions  Medication Sig Dispense Refill  . amLODipine (NORVASC) 10 MG tablet TAKE ONE TABLET BY MOUTH EVERY DAY  30 tablet  5  . aspirin 81 MG tablet Take 81 mg by mouth daily.        Marland Kitchen CIALIS 20 MG tablet TAKE ONE TABLET BY MOUTH EVERY DAY AS NEEDED  10 tablet  0  . COLCRYS 0.6 MG tablet TAKE TWO TABLETS BY MOUTH TWICE DAILY FOR  GOUT  FLARE  60 tablet  0  . KLOR-CON M10 10 MEQ tablet TAKE ONE TABLET  BY MOUTH EVERY DAY  30 tablet  6  . lisinopril-hydrochlorothiazide (PRINZIDE,ZESTORETIC) 20-12.5 MG per tablet TAKE ONE TABLET BY MOUTH TWICE DAILY. NEEDS APPOINTMENT FOR FURTHER REFILLS  60 tablet  5   No current facility-administered medications for this visit.    Allergies  Review of patient's allergies indicates no known  allergies.  Electrocardiogram:  11/17/12  P synch pacing RBBB pattern rate 76   Assessment and Plan

## 2013-06-16 NOTE — Assessment & Plan Note (Signed)
Normal function Talked to tech  F/U in November and they will teach him how to use trans telephonic monitoring

## 2013-06-16 NOTE — Patient Instructions (Signed)
Your physician wants you to follow-up in: YEAR WITH DR NISHAN  You will receive a reminder letter in the mail two months in advance. If you don't receive a letter, please call our office to schedule the follow-up appointment.  Your physician recommends that you continue on your current medications as directed. Please refer to the Current Medication list given to you today. 

## 2013-06-16 NOTE — Assessment & Plan Note (Signed)
Well controlled.  Continue current medications and low sodium Dash type diet.    

## 2013-08-09 ENCOUNTER — Encounter: Payer: Self-pay | Admitting: Internal Medicine

## 2013-08-09 ENCOUNTER — Ambulatory Visit (INDEPENDENT_AMBULATORY_CARE_PROVIDER_SITE_OTHER): Payer: No Typology Code available for payment source | Admitting: Internal Medicine

## 2013-08-09 VITALS — BP 140/89 | HR 80 | Ht 69.5 in | Wt 196.8 lb

## 2013-08-09 DIAGNOSIS — I442 Atrioventricular block, complete: Secondary | ICD-10-CM

## 2013-08-09 DIAGNOSIS — Z950 Presence of cardiac pacemaker: Secondary | ICD-10-CM

## 2013-08-09 DIAGNOSIS — I4430 Unspecified atrioventricular block: Secondary | ICD-10-CM

## 2013-08-09 DIAGNOSIS — I10 Essential (primary) hypertension: Secondary | ICD-10-CM

## 2013-08-09 DIAGNOSIS — I498 Other specified cardiac arrhythmias: Secondary | ICD-10-CM

## 2013-08-09 DIAGNOSIS — Z5189 Encounter for other specified aftercare: Secondary | ICD-10-CM

## 2013-08-09 DIAGNOSIS — R001 Bradycardia, unspecified: Secondary | ICD-10-CM

## 2013-08-09 NOTE — Assessment & Plan Note (Signed)
The patient's device was interrogated.  The information was reviewed. No changes were made in the programming.    

## 2013-08-09 NOTE — Assessment & Plan Note (Signed)
Relative adequate control

## 2013-08-09 NOTE — Assessment & Plan Note (Signed)
Still with some   Will reprogram

## 2013-08-09 NOTE — Assessment & Plan Note (Signed)
Stable post pacing 

## 2013-08-09 NOTE — Progress Notes (Signed)
      Patient Care Team: Hilarie Fredrickson, MD as PCP - General (Family Medicine)   HPI  Robert Flynn is a 67 y.o. male Seen in followup for pacer implanted 11/13 for symptomatic 2:1-3:1 Heart block    Exercise tolerance is much improved. Is not able to climb stairs. He denies chest pain or edema.   Past Medical History  Diagnosis Date  . Gout   . Hypertension   . OBESITY, NOS   . ANXIETY   . ADJUSTMENT DISORDER WITHOUT DEPRESSED MOOD   . EXTERNAL HEMORRHOIDS   . OSTEOARTHRITIS, MULTI SITES   . Sinus bradycardia   . Right bundle branch block and left anterior fascicular block Dec 2013    Progressive heart block with exercise>>3:1; s/p PTVP  . DVT (deep venous thrombosis)     of the right arm post pacemaker; on Xarelto for 3 months  . Pacemaker- [place]     DOI 11/13    Past Surgical History  Procedure Laterality Date  . Pacemaker insertion  December 2013    Current Outpatient Prescriptions  Medication Sig Dispense Refill  . amLODipine (NORVASC) 10 MG tablet TAKE ONE TABLET BY MOUTH EVERY DAY  30 tablet  5  . aspirin 81 MG tablet Take 81 mg by mouth daily.        Marland Kitchen CIALIS 20 MG tablet TAKE ONE TABLET BY MOUTH EVERY DAY AS NEEDED  10 tablet  0  . COLCRYS 0.6 MG tablet TAKE TWO TABLETS BY MOUTH TWICE DAILY FOR  GOUT  FLARE  60 tablet  2  . KLOR-CON M10 10 MEQ tablet TAKE ONE TABLET  BY MOUTH EVERY DAY  30 tablet  6  . lisinopril-hydrochlorothiazide (PRINZIDE,ZESTORETIC) 20-12.5 MG per tablet TAKE ONE TABLET BY MOUTH TWICE DAILY. NEEDS APPOINTMENT FOR FURTHER REFILLS  60 tablet  5   No current facility-administered medications for this visit.    No Known Allergies  Review of Systems negative except from HPI and PMH  Physical Exam BP 140/89  Pulse 80  Ht 5' 9.5" (1.765 m)  Wt 196 lb 12.8 oz (89.268 kg)  BMI 28.66 kg/m2 Well developed and nourished in no acute distress HENT normal Neck supple with JVP-flat Clear Regular rate and rhythm, no murmurs or  gallops Abd-soft with active BS No Clubbing cyanosis edema Skin-warm and dry A & Oriented  Grossly normal sensory and motor function y    Assessment and  Plan

## 2013-08-09 NOTE — Patient Instructions (Signed)
Remote monitoring is used to monitor your Pacemaker of ICD from home. This monitoring reduces the number of office visits required to check your device to one time per year. It allows [place] to keep an eye on the functioning of your device to ensure it is working properly. You are scheduled for a device check from home on 11/10/13. You may send your transmission at any time that day. If you have a wireless device, the transmission will be sent automatically. After your physician reviews your transmission, you will receive a postcard with your next transmission date.  Your physician wants you to follow-up in: one year with Dr. Graciela Husbands.  You will receive a reminder letter in the mail two months in advance. If you don't receive a letter, please call our office to schedule the follow-up appointment.

## 2013-11-10 ENCOUNTER — Encounter: Payer: No Typology Code available for payment source | Admitting: *Deleted

## 2013-11-23 ENCOUNTER — Encounter: Payer: Self-pay | Admitting: *Deleted

## 2013-11-24 ENCOUNTER — Other Ambulatory Visit: Payer: Self-pay | Admitting: *Deleted

## 2013-11-25 ENCOUNTER — Encounter: Payer: Self-pay | Admitting: Family Medicine

## 2013-11-25 ENCOUNTER — Ambulatory Visit (INDEPENDENT_AMBULATORY_CARE_PROVIDER_SITE_OTHER): Payer: Medicare Other | Admitting: Family Medicine

## 2013-11-25 VITALS — BP 130/84 | HR 76 | Temp 98.5°F | Wt 201.0 lb

## 2013-11-25 DIAGNOSIS — I10 Essential (primary) hypertension: Secondary | ICD-10-CM | POA: Diagnosis not present

## 2013-11-25 DIAGNOSIS — Z23 Encounter for immunization: Secondary | ICD-10-CM

## 2013-11-25 LAB — BASIC METABOLIC PANEL
BUN: 17 mg/dL (ref 6–23)
CO2: 33 mEq/L — ABNORMAL HIGH (ref 19–32)
CREATININE: 1.35 mg/dL (ref 0.50–1.35)
Calcium: 9.2 mg/dL (ref 8.4–10.5)
Chloride: 102 mEq/L (ref 96–112)
GLUCOSE: 94 mg/dL (ref 70–99)
POTASSIUM: 3.7 meq/L (ref 3.5–5.3)
Sodium: 143 mEq/L (ref 135–145)

## 2013-11-25 LAB — LIPID PANEL
CHOLESTEROL: 162 mg/dL (ref 0–200)
HDL: 29 mg/dL — ABNORMAL LOW (ref >39)
LDL CALC: 119 mg/dL — AB (ref 0–99)
TRIGLYCERIDES: 71 mg/dL (ref <150)
Total CHOL/HDL Ratio: 5.6 Ratio
VLDL: 14 mg/dL (ref 0–40)

## 2013-11-25 MED ORDER — LISINOPRIL-HYDROCHLOROTHIAZIDE 20-12.5 MG PO TABS: ORAL_TABLET | ORAL | Status: DC

## 2013-11-25 MED ORDER — ZOSTER VACCINE LIVE 19400 UNT/0.65ML ~~LOC~~ SOLR: 0.6500 mL | Freq: Once | SUBCUTANEOUS | Status: DC

## 2013-11-25 NOTE — Progress Notes (Signed)
Patient ID: Robert Flynn, male   DOB: Feb 15, 1946, 68 y.o.   MRN: [id number]    Subjective: HPI: Patient is a 68 y.o. male presenting to clinic today for CPE. Concerns today include: None  1. Hypertension Blood pressure today: 130/84 Taking Meds: Norvasc, Lisinopril-HCTZ Side effects: None, happy with medication ROS: Denies headache, visual changes, nausea, vomiting, chest pain, abdominal pain or shortness of breath. *S/p pacemaker, doing well with no complaints  2. Health Maintenace- Flu shot today Will give Rx for Zoster Denies urinary symptoms. Only gets up once. Declined DRE today, will get at next visit.  History Reviewed: Never smoker.  ROS: Please see HPI above.  Objective: Office vital signs reviewed. BP 130/84  Pulse 76  Temp(Src) 98.5 F (36.9 C) (Oral)  Wt 201 lb (91.173 kg)  Physical Examination:  General: Awake, alert. NAD HEENT: Atraumatic, normocephalic. MMM. Pulm: CTAB, no wheezes Cardio: RRR, no murmurs appreciated Abdomen:+BS, soft, nontender, nondistended Extremities: No edema Neuro: Strength and sensation grossly intact  Assessment: 68 y.o. male follow up  Plan: See Problem List and After Visit Summary

## 2013-11-25 NOTE — Patient Instructions (Signed)
Everything looks great today!  We will check your labs, I will call you if they are not normal.  Follow up in 3 months (call to make an appointment in June.)  Freya Zobrist M. Senaida Chilcote, M.D.

## 2013-11-26 DIAGNOSIS — Z23 Encounter for immunization: Secondary | ICD-10-CM | POA: Insufficient documentation

## 2013-11-26 NOTE — Assessment & Plan Note (Signed)
Will print Rx for Zoster vaccine to take to pharmacy.

## 2013-11-26 NOTE — Assessment & Plan Note (Signed)
BP stable, will con't current regimen. Labs today. F/u in 3 months.

## 2013-11-30 ENCOUNTER — Encounter: Payer: Self-pay | Admitting: Family Medicine

## 2014-01-16 ENCOUNTER — Other Ambulatory Visit: Payer: Self-pay | Admitting: Family Medicine

## 2014-02-16 ENCOUNTER — Encounter: Payer: Self-pay | Admitting: Cardiology

## 2014-02-28 ENCOUNTER — Encounter: Payer: Self-pay | Admitting: Family Medicine

## 2014-02-28 ENCOUNTER — Ambulatory Visit (INDEPENDENT_AMBULATORY_CARE_PROVIDER_SITE_OTHER): Payer: Medicare Other | Admitting: Family Medicine

## 2014-02-28 VITALS — BP 124/80 | HR 67 | Temp 98.3°F | Wt 199.0 lb

## 2014-02-28 DIAGNOSIS — I10 Essential (primary) hypertension: Secondary | ICD-10-CM

## 2014-02-28 DIAGNOSIS — Z0000 Encounter for general adult medical examination without abnormal findings: Secondary | ICD-10-CM | POA: Insufficient documentation

## 2014-02-28 DIAGNOSIS — E785 Hyperlipidemia, unspecified: Secondary | ICD-10-CM

## 2014-02-28 MED ORDER — ZOSTER VACCINE LIVE 19400 UNT/0.65ML ~~LOC~~ SOLR: 0.6500 mL | Freq: Once | SUBCUTANEOUS | Status: DC

## 2014-02-28 MED ORDER — SIMVASTATIN 40 MG PO TABS: 40.0000 mg | ORAL_TABLET | Freq: Every day | ORAL | Status: DC

## 2014-02-28 NOTE — Assessment & Plan Note (Signed)
A: LDL elevated to 119. Not on statin medication despite cardiac risks.  P: - Start Simvastatin 40mg  - Check Cmet next visit - F/u 6 months

## 2014-02-28 NOTE — Assessment & Plan Note (Signed)
A: UTD on all health maintenance except Zostavax.  P: - reprinted Zostavax Rx, however not sure if patient will be able to afford vaccine without Medicare D coverage

## 2014-02-28 NOTE — Patient Instructions (Signed)
Start taking the Simvastatin 40mg  every night for your cholesterol. Everything looks great! We will see you back in 6 months, or sooner if needed. You will need your labs next March.  Consider getting the shingles vaccine at the pharmacy, but I am not sure how much it will cost. This is an optional vaccine.  Vincenzo Stave M. Kaoir Loree, M.D.

## 2014-02-28 NOTE — Progress Notes (Signed)
Subjective:     Robert Flynn is a 68 y.o. male who presents for a physical exam. Patient/parent deny any current health related concerns.   1. Hypertension  Blood pressure today: 124/80 Taking Meds: Norvasc, Lisinopril-HCTZ  Side effects: None, happy with medication  ROS: Denies headache, visual changes, nausea, vomiting, chest pain, abdominal pain or shortness of breath.  *S/p pacemaker, doing well with no complaints. [facility]  2. HLD- Labs from March showed elevated LDL. Not currently on statin.  3. Health Maintenance- UTD. Given Zostavax Rx at last visit: Did not pick up shingle vaccine at pharmacy. Will print it off again, but unsure how much it will cost Denies urinary symptoms. Gets up 0-1 times per night. No family history of prostate issues.  Immunization History  Administered Date(s) Administered  . Influenza Split 10/04/2011, 06/02/2012  . Influenza Whole 08/01/2010  . Influenza,inj,Quad PF,36+ Mos 11/25/2013  . Pneumococcal Polysaccharide-23 10/17/1998, 11/22/2009  . Td 10/17/1998, 02/19/2010   The following portions of the patient's history were reviewed and updated as appropriate: allergies, current medications, past family history, past medical history, past social history, past surgical history and problem list.  Review of Systems Pertinent items are noted in HPI    Objective:    BP 124/80  Pulse 67  Temp(Src) 98.3 F (36.8 C) (Oral)  Wt 199 lb (90.266 kg)  General Appearance:  Alert, cooperative, no distress, appropriate for age                            Head:  Normocephalic, no obvious abnormality                             Eyes:  PERRL, EOM's intact, conjunctiva and corneas clear                             Nose:  Nares symmetrical, septum midline, mucosa pink, clear watery discharge; no sinus tenderness                          Throat:  Lips, tongue, and mucosa are moist, pink, and intact; teeth intact     Neck:  Supple, symmetrical, no adenopathy; thyroid: no enlargement, no carotid bruit, no JVD                             Back:  Symmetrical, no curvature, ROM normal, no CVA tenderness                           Lungs:  Clear to auscultation bilaterally, respirations unlabored                             Heart:  Normal PMI, regular rate & rhythm, S1 and S2 normal, no murmurs, rubs, or gallops. Pacemaker in place.                     Abdomen:  Soft, non-tender, bowel sounds active, no mass              Genitourinary:  Normal male. Small external hemorrhoid. Prostate firm, no nodules appreciated.         Musculoskeletal:  Tone  and strength strong and symmetrical, all extremities            Skin/Hair/Nails:  Skin warm, dry, and intact, no rashes or abnormal dyspigmentation                  Neurologic:  Alert and oriented x3, no cranial nerve deficits, normal strength and tone, gait steady   Assessment:    Annual physical exam     Plan:   See problem based assessment and plan, as well as AVS for additional information.

## 2014-02-28 NOTE — Assessment & Plan Note (Signed)
A: At goal.  P: - Continue current medications - F/u in 6 months.

## 2014-04-07 ENCOUNTER — Other Ambulatory Visit: Payer: Self-pay | Admitting: Internal Medicine

## 2014-04-11 ENCOUNTER — Encounter: Payer: Self-pay | Admitting: Internal Medicine

## 2014-04-18 ENCOUNTER — Encounter: Payer: Self-pay | Admitting: Internal Medicine

## 2014-04-18 ENCOUNTER — Telehealth: Payer: Self-pay | Admitting: Internal Medicine

## 2014-04-18 NOTE — Telephone Encounter (Signed)
04-18-14 lmm @ 1215pm to have pt call to send in (past due) pacer remote or set up pacer ck with device clinic/mt

## 2014-06-16 ENCOUNTER — Ambulatory Visit (INDEPENDENT_AMBULATORY_CARE_PROVIDER_SITE_OTHER): Payer: Medicare Other | Admitting: Cardiovascular Disease

## 2014-06-16 ENCOUNTER — Encounter: Payer: Self-pay | Admitting: Cardiovascular Disease

## 2014-06-16 VITALS — BP 140/82 | HR 60 | Ht 69.5 in | Wt 198.0 lb

## 2014-06-16 DIAGNOSIS — E785 Hyperlipidemia, unspecified: Secondary | ICD-10-CM

## 2014-06-16 DIAGNOSIS — I442 Atrioventricular block, complete: Secondary | ICD-10-CM

## 2014-06-16 NOTE — Patient Instructions (Addendum)
Your physician wants you to follow-up in:   MAY  2016 WITH DR Haywood Filler will receive a reminder letter in the mail two months in advance. If you don't receive a letter, please call our office to schedule the follow-up appointment. Your physician recommends that you continue on your current medications as directed. Please refer to the Current Medication list given to you today.

## 2014-06-16 NOTE — Assessment & Plan Note (Signed)
Well controlled.  Continue current medications and low sodium Dash type diet.    

## 2014-06-16 NOTE — Progress Notes (Signed)
Patient ID: Robert Flynn, male   DOB: [birth year], 68 y.o.   MRN: [id number] 68 yo with bradycardia Had CHB on ETT and received pacer with Dr Graciela Husbands 08/03/12 St Jude dual chamber pacer Some arthritis in shoulder  No palpitations or syncope. Compliant with BP meds   06/18/12 Normal echo EF 55-60% mild aortic root dilatation no valve disease  08/10/12 normal myovue no ischemia reviewed   No complaints Seeing Dr Graciela Husbands 11/24   ROS: Denies fever, malais, weight loss, blurry vision, decreased visual acuity, cough, sputum, SOB, hemoptysis, pleuritic pain, palpitaitons, heartburn, abdominal pain, melena, lower extremity edema, claudication, or rash.  All other systems reviewed and negative  General: Affect appropriate Healthy:  appears stated age HEENT: normal Neck supple with no adenopathy JVP normal no bruits no thyromegaly Lungs clear with no wheezing and good diaphragmatic motion Heart:  S1/S2 no murmur, no rub, gallop or click pacer under left clavicle in good position  PMI normal Abdomen: benighn, BS positve, no tenderness, no AAA no bruit.  No HSM or HJR Distal pulses intact with no bruits No edema Neuro non-focal Skin warm and dry No muscular weakness   Current Outpatient Prescriptions  Medication Sig Dispense Refill  . amLODipine (NORVASC) 10 MG tablet TAKE ONE TABLET BY MOUTH ONCE DAILY  30 tablet  11  . aspirin 81 MG tablet Take 81 mg by mouth daily.        Marland Kitchen CIALIS 20 MG tablet TAKE ONE TABLET BY MOUTH EVERY DAY AS NEEDED  10 tablet  0  . COLCRYS 0.6 MG tablet TAKE TWO TABLETS BY MOUTH TWICE DAILY FOR  GOUT  FLARE  60 tablet  2  . KLOR-CON M10 10 MEQ tablet TAKE ONE TABLET BY MOUTH ONCE DAILY  30 tablet  3  . lisinopril-hydrochlorothiazide (PRINZIDE,ZESTORETIC) 20-12.5 MG per tablet TAKE ONE TABLET BY MOUTH TWICE DAILY. NEEDS APPOINTMENT FOR FURTHER REFILLS  60 tablet  5  . simvastatin (ZOCOR) 40 MG tablet Take 1 tablet (40 mg total) by mouth at bedtime.  90 tablet  3  .  zoster vaccine live, PF, (ZOSTAVAX) 54562 UNT/0.65ML injection Inject 19,400 Units into the skin once.  1 each  0   No current facility-administered medications for this visit.    Allergies  Review of patient's allergies indicates no known allergies.  Electrocardiogram:  V pacing LBBB pattern 1/15  Today V pacing 60 LBBB no change   Assessment and Plan

## 2014-06-16 NOTE — Assessment & Plan Note (Signed)
Cholesterol is at goal.  Continue current dose of statin and diet Rx.  No myalgias or side effects.  F/U  LFT's in 6 months. Lab Results  Component Value Date   LDLCALC 119* 11/25/2013

## 2014-06-16 NOTE — Assessment & Plan Note (Signed)
Normal functioning pacer f/u SK 11/15  No further weakness or syncope

## 2014-08-01 ENCOUNTER — Other Ambulatory Visit: Payer: Self-pay | Admitting: *Deleted

## 2014-08-01 MED ORDER — COLCHICINE 0.6 MG PO TABS: ORAL_TABLET | ORAL | Status: DC

## 2014-08-09 ENCOUNTER — Encounter: Payer: Self-pay | Admitting: Internal Medicine

## 2014-08-09 ENCOUNTER — Ambulatory Visit (INDEPENDENT_AMBULATORY_CARE_PROVIDER_SITE_OTHER): Payer: Medicare Other | Admitting: Internal Medicine

## 2014-08-09 VITALS — BP 144/72 | HR 82 | Ht 69.5 in | Wt 202.8 lb

## 2014-08-09 DIAGNOSIS — Z45018 Encounter for adjustment and management of other part of cardiac pacemaker: Secondary | ICD-10-CM | POA: Diagnosis not present

## 2014-08-09 DIAGNOSIS — I442 Atrioventricular block, complete: Secondary | ICD-10-CM | POA: Diagnosis not present

## 2014-08-09 LAB — MDC_IDC_ENUM_SESS_TYPE_INCLINIC
Battery Remaining Longevity: 105.6 mo
Brady Statistic RV Percent Paced: 99.98 %
Date Time Interrogation Session: 20151124143757
Implantable Pulse Generator Model: 2110
Lead Channel Impedance Value: 362.5 Ohm
Lead Channel Pacing Threshold Amplitude: 0.75 V
Lead Channel Sensing Intrinsic Amplitude: 12 mV
Lead Channel Sensing Intrinsic Amplitude: 2.8 mV
Lead Channel Setting Pacing Amplitude: 2 V
Lead Channel Setting Pacing Pulse Width: 0.4 ms
MDC IDC MSMT BATTERY VOLTAGE: 2.95 V
MDC IDC MSMT LEADCHNL RA PACING THRESHOLD PULSEWIDTH: 0.4 ms
MDC IDC MSMT LEADCHNL RV IMPEDANCE VALUE: 575 Ohm
MDC IDC MSMT LEADCHNL RV PACING THRESHOLD AMPLITUDE: 0.75 V
MDC IDC MSMT LEADCHNL RV PACING THRESHOLD PULSEWIDTH: 0.4 ms
MDC IDC PG SERIAL: 7362469
MDC IDC SET LEADCHNL RV PACING AMPLITUDE: 2.5 V
MDC IDC SET LEADCHNL RV SENSING SENSITIVITY: 2 mV
MDC IDC STAT BRADY RA PERCENT PACED: 32 %

## 2014-08-09 NOTE — Patient Instructions (Signed)
Your physician recommends that you continue on your current medications as directed. Please refer to the Current Medication list given to you today.  Remote monitoring is used to monitor your Pacemaker of ICD from home. This monitoring reduces the number of office visits required to check your device to one time per year. It allows us to keep an eye on the functioning of your device to ensure it is working properly. You are scheduled for a device check from home on 11/09/14. You may send your transmission at any time that day. If you have a wireless device, the transmission will be sent automatically. After your physician reviews your transmission, you will receive a postcard with your next transmission date.  Your physician wants you to follow-up in: 1 year with Dr. Klein.  You will receive a reminder letter in the mail two months in advance. If you don't receive a letter, please call our office to schedule the follow-up appointment.  

## 2014-08-09 NOTE — Progress Notes (Signed)
      Patient Care Team: Pincus Large, DO as PCP - General   HPI  Robert Flynn is a 68 y.o. male Seen in followup for pacer implanted 11/13 for symptomatic 2:1-3:1 Heart block    Exercise tolerance is much improved.   He denies chest pain or edema.   Past Medical History  Diagnosis Date  . Gout   . Hypertension   . OBESITY, NOS   . ANXIETY   . ADJUSTMENT DISORDER WITHOUT DEPRESSED MOOD   . EXTERNAL HEMORRHOIDS   . OSTEOARTHRITIS, MULTI SITES   . Sinus bradycardia   . Right bundle branch block and left anterior fascicular block Dec 2013    Progressive heart block with exercise>>3:1; s/p PTVP  . DVT (deep venous thrombosis)     of the right arm post pacemaker; on Xarelto for 3 months  . Pacemaker- [employer]     DOI 11/13    Past Surgical History  Procedure Laterality Date  . Pacemaker insertion  December 2013    Current Outpatient Prescriptions  Medication Sig Dispense Refill  . amLODipine (NORVASC) 10 MG tablet TAKE ONE TABLET BY MOUTH ONCE DAILY 30 tablet 11  . aspirin 81 MG tablet Take 81 mg by mouth daily.      Marland Kitchen CIALIS 20 MG tablet TAKE ONE TABLET BY MOUTH EVERY DAY AS NEEDED 10 tablet 0  . colchicine (COLCRYS) 0.6 MG tablet TAKE TWO TABLETS BY MOUTH TWICE DAILY FOR  GOUT  FLARE 60 tablet 2  . KLOR-CON M10 10 MEQ tablet TAKE ONE TABLET BY MOUTH ONCE DAILY 30 tablet 3  . lisinopril-hydrochlorothiazide (PRINZIDE,ZESTORETIC) 20-12.5 MG per tablet TAKE ONE TABLET BY MOUTH TWICE DAILY. NEEDS APPOINTMENT FOR FURTHER REFILLS 60 tablet 5  . simvastatin (ZOCOR) 40 MG tablet Take 1 tablet (40 mg total) by mouth at bedtime. 90 tablet 3   No current facility-administered medications for this visit.    No Known Allergies  Review of Systems negative except from HPI and PMH  Physical Exam BP 144/72 mmHg  Pulse 82  Ht 5' 9.5" (1.765 m)  Wt 91.989 kg (202 lb 12.8 oz)  BMI 29.53 kg/m2 Well developed and nourished in no acute distress HENT normal Neck supple with  JVP-flat Clear Device pocket well healed; without hematoma or erythema.  There is no tethering Regular rate and rhythm, no murmurs or gallops Abd-soft with active BS No Clubbing cyanosis edema Skin-warm and dry A & Oriented  Grossly normal sensory and motor function      Assessment and  Plan Complete heart block stable post pacing  Pacemaker [employer] The patient's device was interrogated.  The information was reviewed. No changes were made in the programming.     Hypertension Elevated  Will have him followup with PCP

## 2014-08-09 NOTE — Addendum Note (Signed)
Addended by: Baird Lyons on: 08/09/2014 02:58 PM   Modules accepted: Level of Service

## 2014-08-25 ENCOUNTER — Encounter (HOSPITAL_COMMUNITY): Payer: Self-pay | Admitting: Internal Medicine

## 2014-08-31 ENCOUNTER — Other Ambulatory Visit: Payer: Self-pay | Admitting: Cardiovascular Disease

## 2014-09-12 ENCOUNTER — Other Ambulatory Visit: Payer: Self-pay | Admitting: *Deleted

## 2014-09-12 MED ORDER — LISINOPRIL-HYDROCHLOROTHIAZIDE 20-12.5 MG PO TABS: ORAL_TABLET | ORAL | Status: DC

## 2014-10-12 ENCOUNTER — Other Ambulatory Visit: Payer: Self-pay | Admitting: Cardiovascular Disease

## 2014-11-09 ENCOUNTER — Telehealth: Payer: Self-pay | Admitting: Cardiology

## 2014-11-09 ENCOUNTER — Encounter: Payer: Medicare Other | Admitting: *Deleted

## 2014-11-09 NOTE — Telephone Encounter (Signed)
Spoke with pt and reminded pt of remote transmission that is due today. Pt verbalized understanding.   

## 2014-11-10 ENCOUNTER — Encounter: Payer: Self-pay | Admitting: Cardiology

## 2015-01-23 ENCOUNTER — Ambulatory Visit (INDEPENDENT_AMBULATORY_CARE_PROVIDER_SITE_OTHER): Payer: Medicare Other | Admitting: Cardiovascular Disease

## 2015-01-23 ENCOUNTER — Encounter: Payer: Self-pay | Admitting: Cardiovascular Disease

## 2015-01-23 VITALS — BP 134/98 | HR 74 | Ht 69.0 in | Wt 198.0 lb

## 2015-01-23 DIAGNOSIS — Z950 Presence of cardiac pacemaker: Secondary | ICD-10-CM | POA: Diagnosis not present

## 2015-01-23 NOTE — Patient Instructions (Signed)
Medication Instructions:  NO CHANGES  Labwork: NONE  Testing/Procedures: NONE  Follow-Up: Your physician wants you to follow-up in:   6 MONTHS WITH DR Logan Bores will receive a reminder letter in the mail two months in advance. If you don't receive a letter, please call our office to schedule the follow-up appointment.  Any Other Special Instructions Will Be Listed Below (If Applicable).

## 2015-01-23 NOTE — Progress Notes (Signed)
Patient ID: Robert Flynn, male   DOB: 09-08-1946, 69 y.o.   MRN: [id number] 69 y.o.  with bradycardia Had CHB on ETT and received pacer with Dr Graciela Husbands 08/03/12 [employer] dual chamber pacer Some arthritis in shoulder  No palpitations or syncope. Compliant with BP meds   06/18/12 Normal echo EF 55-60% mild aortic root dilatation no valve disease  08/10/12 normal myovue no ischemia reviewed   No complaints    ROS: Denies fever, malais, weight loss, blurry vision, decreased visual acuity, cough, sputum, SOB, hemoptysis, pleuritic pain, palpitaitons, heartburn, abdominal pain, melena, lower extremity edema, claudication, or rash.  All other systems reviewed and negative  General: Affect appropriate Healthy:  appears stated age HEENT: normal Neck supple with no adenopathy JVP normal no bruits no thyromegaly Lungs clear with no wheezing and good diaphragmatic motion Heart:  S1/S2 no murmur, no rub, gallop or click pacer under left clavicle in good position  PMI normal Abdomen: benighn, BS positve, no tenderness, no AAA no bruit.  No HSM or HJR Distal pulses intact with no bruits No edema Neuro non-focal Skin warm and dry No muscular weakness   Current Outpatient Prescriptions  Medication Sig Dispense Refill  . amLODipine (NORVASC) 10 MG tablet TAKE ONE TABLET BY MOUTH ONCE DAILY 30 tablet 11  . aspirin 81 MG tablet Take 81 mg by mouth daily.      Marland Kitchen CIALIS 20 MG tablet TAKE ONE TABLET BY MOUTH EVERY DAY AS NEEDED 10 tablet 0  . colchicine (COLCRYS) 0.6 MG tablet TAKE TWO TABLETS BY MOUTH TWICE DAILY FOR  GOUT  FLARE 60 tablet 2  . KLOR-CON M10 10 MEQ tablet TAKE ONE TABLET BY MOUTH ONCE DAILY. 30 tablet 3  . lisinopril-hydrochlorothiazide (PRINZIDE,ZESTORETIC) 20-12.5 MG per tablet TAKE ONE TABLET BY MOUTH TWICE DAILY. NEEDS APPOINTMENT FOR FURTHER REFILLS 60 tablet 5  . simvastatin (ZOCOR) 40 MG tablet Take 1 tablet (40 mg total) by mouth at bedtime. 90 tablet 3   No current  facility-administered medications for this visit.    Allergies  Review of patient's allergies indicates no known allergies.  Electrocardiogram:  V pacing LBBB pattern 1/15  Today V pacing 60 LBBB no change   Assessment and Plan Pacer: normal function f/u Dr Graciela Husbands HTN:  Continue current meds well controlled Chol:  On statin  Cholesterol is at goal.  Continue current dose of statin and diet Rx.  No myalgias or side effects.  F/U  LFT's in 6 months. Lab Results  Component Value Date   LDLCALC 119* 11/25/2013

## 2015-01-25 ENCOUNTER — Other Ambulatory Visit: Payer: Self-pay | Admitting: *Deleted

## 2015-01-25 MED ORDER — AMLODIPINE BESYLATE 10 MG PO TABS: 10.0000 mg | ORAL_TABLET | Freq: Every day | ORAL | Status: DC

## 2015-02-26 ENCOUNTER — Other Ambulatory Visit: Payer: Self-pay | Admitting: Cardiovascular Disease

## 2015-03-22 ENCOUNTER — Encounter: Payer: Self-pay | Admitting: *Deleted

## 2015-03-28 ENCOUNTER — Ambulatory Visit (INDEPENDENT_AMBULATORY_CARE_PROVIDER_SITE_OTHER): Payer: Medicare Other | Admitting: Obstetrics and Gynecology

## 2015-03-28 ENCOUNTER — Encounter: Payer: Self-pay | Admitting: Obstetrics and Gynecology

## 2015-03-28 VITALS — BP 147/80 | HR 80 | Temp 98.5°F | Ht 69.5 in | Wt 199.3 lb

## 2015-03-28 DIAGNOSIS — M1000 Idiopathic gout, unspecified site: Secondary | ICD-10-CM | POA: Diagnosis not present

## 2015-03-28 DIAGNOSIS — E785 Hyperlipidemia, unspecified: Secondary | ICD-10-CM | POA: Diagnosis not present

## 2015-03-28 DIAGNOSIS — Z950 Presence of cardiac pacemaker: Secondary | ICD-10-CM | POA: Diagnosis not present

## 2015-03-28 DIAGNOSIS — M109 Gout, unspecified: Secondary | ICD-10-CM

## 2015-03-28 DIAGNOSIS — Z0000 Encounter for general adult medical examination without abnormal findings: Secondary | ICD-10-CM | POA: Diagnosis present

## 2015-03-28 DIAGNOSIS — I10 Essential (primary) hypertension: Secondary | ICD-10-CM

## 2015-03-28 DIAGNOSIS — M10079 Idiopathic gout, unspecified ankle and foot: Secondary | ICD-10-CM

## 2015-03-28 LAB — CBC
HCT: 39.9 % (ref 39.0–52.0)
HEMOGLOBIN: 13.5 g/dL (ref 13.0–17.0)
MCH: 29 pg (ref 26.0–34.0)
MCHC: 33.8 g/dL (ref 30.0–36.0)
MCV: 85.6 fL (ref 78.0–100.0)
MPV: 10.5 fL (ref 8.6–12.4)
Platelets: 166 10*3/uL (ref 150–400)
RBC: 4.66 MIL/uL (ref 4.22–5.81)
RDW: 15.4 % (ref 11.5–15.5)
WBC: 5.4 10*3/uL (ref 4.0–10.5)

## 2015-03-28 NOTE — Patient Instructions (Signed)
Mr. haro it was great to see you today!  I am pleased to hear that things are going well for you.  Here are some of the things we discussed today: -I am getting lab work; I will contact you with your results -Continue your medications. No changes today.  -Please schedule yourself for a colonscopy  Please schedule a follow-up appointment as needed.   Thanks for allowing me to be a part of your care! Dr. Doroteo Glassman   Health Maintenance A healthy lifestyle and preventative care can promote health and wellness.  Maintain regular health, dental, and eye exams.  Eat a healthy diet. Foods like vegetables, fruits, whole grains, low-fat dairy products, and lean protein foods contain the nutrients you need and are low in calories. Decrease your intake of foods high in solid fats, added sugars, and salt. Get information about a proper diet from your health care provider, if necessary.  Regular physical exercise is one of the most important things you can do for your health. Most adults should get at least 150 minutes of moderate-intensity exercise (any activity that increases your heart rate and causes you to sweat) each week. In addition, most adults need muscle-strengthening exercises on 2 or more days a week.   Maintain a healthy weight. The body mass index (BMI) is a screening tool to identify possible weight problems. It provides an estimate of body fat based on height and weight. Your health care provider can find your BMI and can help you achieve or maintain a healthy weight. For males 20 years and older:  A BMI below 18.5 is considered underweight.  A BMI of 18.5 to 24.9 is normal.  A BMI of 25 to 29.9 is considered overweight.  A BMI of 30 and above is considered obese.  Maintain normal blood lipids and cholesterol by exercising and minimizing your intake of saturated fat. Eat a balanced diet with plenty of fruits and vegetables. Blood tests for lipids and cholesterol should begin at  age 20 and be repeated every 5 years. If your lipid or cholesterol levels are high, you are over age 62, or you are at high risk for heart disease, you may need your cholesterol levels checked more frequently.Ongoing high lipid and cholesterol levels should be treated with medicines if diet and exercise are not working.  If you smoke, find out from your health care provider how to quit. If you do not use tobacco, do not start.  Lung cancer screening is recommended for adults aged 55-80 years who are at high risk for developing lung cancer because of a history of smoking. A yearly low-dose CT scan of the lungs is recommended for people who have at least a 30-pack-year history of smoking and are current smokers or have quit within the past 15 years. A pack year of smoking is smoking an average of 1 pack of cigarettes a day for 1 year (for example, a 30-pack-year history of smoking could mean smoking 1 pack a day for 30 years or 2 packs a day for 15 years). Yearly screening should continue until the smoker has stopped smoking for at least 15 years. Yearly screening should be stopped for people who develop a health problem that would prevent them from having lung cancer treatment.  If you choose to drink alcohol, do not have more than 2 drinks per day. One drink is considered to be 12 oz (360 mL) of beer, 5 oz (150 mL) of wine, or 1.5 oz (45 mL) of  liquor.  Avoid the use of street drugs. Do not share needles with anyone. Ask for help if you need support or instructions about stopping the use of drugs.  High blood pressure causes heart disease and increases the risk of stroke. Blood pressure should be checked at least every 1-2 years. Ongoing high blood pressure should be treated with medicines if weight loss and exercise are not effective.  If you are 84-17 years old, ask your health care provider if you should take aspirin to prevent heart disease.  Diabetes screening involves taking a blood sample to  check your fasting blood sugar level. This should be done once every 3 years after age 43 if you are at a normal weight and without risk factors for diabetes. Testing should be considered at a younger age or be carried out more frequently if you are overweight and have at least 1 risk factor for diabetes.  Colorectal cancer can be detected and often prevented. Most routine colorectal cancer screening begins at the age of 63 and continues through age 43. However, your health care provider may recommend screening at an earlier age if you have risk factors for colon cancer. On a yearly basis, your health care provider may provide home test kits to check for hidden blood in the stool. A small camera at the end of a tube may be used to directly examine the colon (sigmoidoscopy or colonoscopy) to detect the earliest forms of colorectal cancer. Talk to your health care provider about this at age 62 when routine screening begins. A direct exam of the colon should be repeated every 5-10 years through age 40, unless early forms of precancerous polyps or small growths are found.  People who are at an increased risk for hepatitis B should be screened for this virus. You are considered at high risk for hepatitis B if:  You were born in a country where hepatitis B occurs often. Talk with your health care provider about which countries are considered high risk.  Your parents were born in a high-risk country and you have not received a shot to protect against hepatitis B (hepatitis B vaccine).  You have HIV or AIDS.  You use needles to inject street drugs.  You live with, or have sex with, someone who has hepatitis B.  You are a man who has sex with other men (MSM).  You get hemodialysis treatment.  You take certain medicines for conditions like cancer, organ transplantation, and autoimmune conditions.  Hepatitis C blood testing is recommended for all people born from [birth year] through 1967 and any individual with  known risk factors for hepatitis C.  Healthy men should no longer receive prostate-specific antigen (PSA) blood tests as part of routine cancer screening. Talk to your health care provider about prostate cancer screening.  Testicular cancer screening is not recommended for adolescents or adult males who have no symptoms. Screening includes self-exam, a health care provider exam, and other screening tests. Consult with your health care provider about any symptoms you have or any concerns you have about testicular cancer.  Practice safe sex. Use condoms and avoid high-risk sexual practices to reduce the spread of sexually transmitted infections (STIs).  You should be screened for STIs, including gonorrhea and chlamydia if:  You are sexually active and are younger than 24 years.  You are older than 24 years, and your health care provider tells you that you are at risk for this type of infection.  Your sexual activity has changed  since you were last screened, and you are at an increased risk for chlamydia or gonorrhea. Ask your health care provider if you are at risk.  If you are at risk of being infected with HIV, it is recommended that you take a prescription medicine daily to prevent HIV infection. This is called pre-exposure prophylaxis (PrEP). You are considered at risk if:  You are a man who has sex with other men (MSM).  You are a heterosexual man who is sexually active with multiple partners.  You take drugs by injection.  You are sexually active with a partner who has HIV.  Talk with your health care provider about whether you are at high risk of being infected with HIV. If you choose to begin PrEP, you should first be tested for HIV. You should then be tested every 3 months for as long as you are taking PrEP.  Use sunscreen. Apply sunscreen liberally and repeatedly throughout the day. You should seek shade when your shadow is shorter than you. Protect yourself by wearing long  sleeves, pants, a wide-brimmed hat, and sunglasses year round whenever you are outdoors.  Tell your health care provider of new moles or changes in moles, especially if there is a change in shape or color. Also, tell your health care provider if a mole is larger than the size of a pencil eraser.  A one-time screening for abdominal aortic aneurysm (AAA) and surgical repair of large AAAs by ultrasound is recommended for men aged 65-75 years who are current or former smokers.  Stay current with your vaccines (immunizations). Document Released: 02/29/2008 Document Revised: 09/07/2013 Document Reviewed: 01/28/2011 [facility] Patient Information 2015 [place], [place]. This information is not intended to replace advice given to you by your health care provider. Make sure you discuss any questions you have with your health care provider.

## 2015-03-28 NOTE — Progress Notes (Signed)
    Subjective: Chief Complaint  Patient presents with  . Follow-up    check up    HPI: Robert Flynn is a 69 y.o. presenting to clinic today for routine check-up. Patient states he has no current concerns to discuss today. He denies any pain. States he has not been seen by PCP in over 2 years.  #Gout: Last flare was 2 weeks ago. Patient says it was triggered by food. Usually only has 2-3 flares a year. #HTN/Cardiac disease: Controlled by Cardiology. BPs have been appropriate  Review of Systems  Constitutional: Negative for fever and chills.  Eyes: Negative for blurred vision.  Respiratory: Negative for shortness of breath.   Cardiovascular: Negative for chest pain.  Gastrointestinal: Negative for nausea and vomiting.  Musculoskeletal: Negative for joint pain.  Neurological: Negative for dizziness, weakness and headaches.  Psychiatric/Behavioral: The patient is not nervous/anxious.     Past Medical History  Diagnosis Date  . Gout   . Hypertension   . OBESITY, NOS   . ANXIETY   . ADJUSTMENT DISORDER WITHOUT DEPRESSED MOOD   . EXTERNAL HEMORRHOIDS   . OSTEOARTHRITIS, MULTI SITES   . Sinus bradycardia   . Right bundle branch block and left anterior fascicular block Dec 2013    Progressive heart block with exercise>>3:1; s/p PTVP  . DVT (deep venous thrombosis)     of the right arm post pacemaker; on Xarelto for 3 months  . Pacemaker- [address] [employer]     DOI 11/13  Past Medical, Surgical, Social, and Family History Reviewed & Updated per EMR.   Health Maintenance: Due for colonoscopy and blood work    Objective: BP 147/80 mmHg  Pulse 80  Temp(Src) 98.5 F (36.9 C) (Oral)  Ht 5' 9.5" (1.765 m)  Wt 199 lb 5 oz (90.408 kg)  BMI 29.02 kg/m2  Physical Exam  Constitutional: He is oriented to person, place, and time and well-developed, well-nourished, and in no distress.  HENT:  Head: Normocephalic and atraumatic.  Mouth/Throat: Oropharynx is clear and moist.  Eyes: EOM  are normal.  Neck: Normal range of motion. Neck supple. No thyromegaly present.  Cardiovascular: Normal rate, regular rhythm and intact distal pulses.   Clicking from pacer appreciated.   Pulmonary/Chest: Effort normal and breath sounds normal.  Abdominal: Soft. Bowel sounds are normal. There is no tenderness.  Musculoskeletal: Normal range of motion. He exhibits no edema.  Lymphadenopathy:    He has no cervical adenopathy.  Neurological: He is alert and oriented to person, place, and time. He has normal sensation, normal strength and normal reflexes. Gait normal.  No focal deficits appreciated  Skin: Skin is warm and dry.  Psychiatric: Mood and affect normal.    Assessment/Plan: Please see problem based Assessment and Plan  Orders Placed This Encounter  Procedures  . CBC  . Lipid Panel  . Basic Metabolic Panel  . Uric Acid     Caryl Ada, DO 03/29/2015, 7:51 PM PGY-2, [facility]

## 2015-03-29 LAB — BASIC METABOLIC PANEL
BUN: 17 mg/dL (ref 6–23)
CO2: 27 mEq/L (ref 19–32)
CREATININE: 1.54 mg/dL — AB (ref 0.50–1.35)
Calcium: 9.4 mg/dL (ref 8.4–10.5)
Chloride: 103 mEq/L (ref 96–112)
Glucose, Bld: 119 mg/dL — ABNORMAL HIGH (ref 70–99)
Potassium: 3.6 mEq/L (ref 3.5–5.3)
Sodium: 140 mEq/L (ref 135–145)

## 2015-03-29 LAB — LIPID PANEL
CHOL/HDL RATIO: 5.1 ratio
Cholesterol: 142 mg/dL (ref 0–200)
HDL: 28 mg/dL — AB (ref >=40)
LDL CALC: 78 mg/dL (ref 0–99)
TRIGLYCERIDES: 180 mg/dL — AB (ref <150)
VLDL: 36 mg/dL (ref 0–40)

## 2015-03-29 LAB — URIC ACID: Uric Acid, Serum: 8.3 mg/dL — ABNORMAL HIGH (ref 4.0–7.8)

## 2015-03-29 NOTE — Assessment & Plan Note (Signed)
Well controlled with only 2-3 flares a year. Encouraged avoiding foods that trigger flairs. Uric acid ordered today. No change in therapy needed.

## 2015-03-29 NOTE — Assessment & Plan Note (Signed)
Encouraged patient to continue to follow with cardiology for interrogation of pacer and monitoring.

## 2015-03-29 NOTE — Assessment & Plan Note (Signed)
Controlled. Continue current regimen. 

## 2015-03-29 NOTE — Assessment & Plan Note (Signed)
A: Needs colonoscopy; otherwise up to date P: Handout given for patient to call and schedule colonoscopy

## 2015-03-29 NOTE — Assessment & Plan Note (Signed)
Lipid panel ordered today. Denies side effects. Will continue current therapy at this time.

## 2015-04-03 ENCOUNTER — Encounter: Payer: Self-pay | Admitting: Obstetrics and Gynecology

## 2015-04-04 ENCOUNTER — Ambulatory Visit (INDEPENDENT_AMBULATORY_CARE_PROVIDER_SITE_OTHER): Payer: Medicare Other | Admitting: *Deleted

## 2015-04-04 ENCOUNTER — Encounter: Payer: Self-pay | Admitting: Internal Medicine

## 2015-04-04 DIAGNOSIS — I4430 Unspecified atrioventricular block: Secondary | ICD-10-CM

## 2015-04-04 NOTE — Progress Notes (Signed)
Remote pacemaker transmission.   

## 2015-04-05 LAB — CUP PACEART REMOTE DEVICE CHECK
Battery Remaining Longevity: 92 mo
Battery Remaining Percentage: 81 %
Brady Statistic AP VP Percent: 30 %
Brady Statistic AP VS Percent: 1 %
Brady Statistic AS VP Percent: 70 %
Brady Statistic AS VS Percent: 1 %
Brady Statistic RA Percent Paced: 30 %
Lead Channel Impedance Value: 360 Ohm
Lead Channel Pacing Threshold Pulse Width: 0.4 ms
Lead Channel Pacing Threshold Pulse Width: 0.4 ms
Lead Channel Sensing Intrinsic Amplitude: 12 mV
Lead Channel Sensing Intrinsic Amplitude: 5 mV
Lead Channel Setting Pacing Amplitude: 2 V
Lead Channel Setting Pacing Amplitude: 2.5 V
Lead Channel Setting Pacing Pulse Width: 0.4 ms
Lead Channel Setting Sensing Sensitivity: 2 mV
MDC IDC MSMT BATTERY VOLTAGE: 2.93 V
MDC IDC MSMT LEADCHNL RA PACING THRESHOLD AMPLITUDE: 0.75 V
MDC IDC MSMT LEADCHNL RV IMPEDANCE VALUE: 510 Ohm
MDC IDC MSMT LEADCHNL RV PACING THRESHOLD AMPLITUDE: 0.75 V
MDC IDC PG SERIAL: 7362469
MDC IDC SESS DTM: 20160719141846
MDC IDC STAT BRADY RV PERCENT PACED: 99 %
Pulse Gen Model: 2110

## 2015-04-10 ENCOUNTER — Other Ambulatory Visit: Payer: Self-pay | Admitting: *Deleted

## 2015-04-10 MED ORDER — SIMVASTATIN 40 MG PO TABS: 40.0000 mg | ORAL_TABLET | Freq: Every day | ORAL | Status: DC

## 2015-04-26 ENCOUNTER — Encounter: Payer: Self-pay | Admitting: Cardiology

## 2015-05-18 ENCOUNTER — Other Ambulatory Visit: Payer: Self-pay | Admitting: Obstetrics and Gynecology

## 2015-07-04 ENCOUNTER — Telehealth: Payer: Self-pay | Admitting: Cardiology

## 2015-07-04 ENCOUNTER — Encounter: Payer: Medicare Other | Admitting: *Deleted

## 2015-07-04 NOTE — Telephone Encounter (Signed)
Spoke with pt and reminded pt of remote transmission that is due today. Pt verbalized understanding.   

## 2015-07-05 ENCOUNTER — Encounter: Payer: Self-pay | Admitting: Cardiology

## 2015-07-17 ENCOUNTER — Ambulatory Visit (INDEPENDENT_AMBULATORY_CARE_PROVIDER_SITE_OTHER): Payer: Medicare Other | Admitting: *Deleted

## 2015-07-17 DIAGNOSIS — I442 Atrioventricular block, complete: Secondary | ICD-10-CM | POA: Diagnosis not present

## 2015-07-20 NOTE — Progress Notes (Signed)
Remote pacemaker transmission.   

## 2015-07-21 LAB — CUP PACEART REMOTE DEVICE CHECK
Battery Remaining Longevity: 94 mo
Battery Voltage: 2.93 V
Brady Statistic AP VS Percent: 1 %
Brady Statistic AS VS Percent: 1 %
Date Time Interrogation Session: 20161031161900
Implantable Lead Location: 753859
Lead Channel Impedance Value: 350 Ohm
Lead Channel Impedance Value: 550 Ohm
Lead Channel Pacing Threshold Amplitude: 0.75 V
Lead Channel Pacing Threshold Pulse Width: 0.4 ms
Lead Channel Pacing Threshold Pulse Width: 0.4 ms
Lead Channel Setting Pacing Amplitude: 2 V
Lead Channel Setting Pacing Amplitude: 2.5 V
Lead Channel Setting Pacing Pulse Width: 0.4 ms
MDC IDC LEAD IMPLANT DT: 20131118
MDC IDC LEAD IMPLANT DT: 20131118
MDC IDC LEAD LOCATION: 753860
MDC IDC MSMT BATTERY REMAINING PERCENTAGE: 81 %
MDC IDC MSMT LEADCHNL RA PACING THRESHOLD AMPLITUDE: 0.75 V
MDC IDC MSMT LEADCHNL RA SENSING INTR AMPL: 1.7 mV
MDC IDC MSMT LEADCHNL RV SENSING INTR AMPL: 12 mV
MDC IDC SET LEADCHNL RV SENSING SENSITIVITY: 2 mV
MDC IDC STAT BRADY AP VP PERCENT: 31 %
MDC IDC STAT BRADY AS VP PERCENT: 69 %
MDC IDC STAT BRADY RA PERCENT PACED: 31 %
MDC IDC STAT BRADY RV PERCENT PACED: 99 %
Pulse Gen Model: 2110
Pulse Gen Serial Number: 7362469

## 2015-07-24 ENCOUNTER — Encounter: Payer: Self-pay | Admitting: Cardiology

## 2015-08-11 ENCOUNTER — Other Ambulatory Visit: Payer: Self-pay | Admitting: Obstetrics and Gynecology

## 2015-08-16 ENCOUNTER — Telehealth: Payer: Self-pay | Admitting: Internal Medicine

## 2015-08-16 NOTE — Telephone Encounter (Signed)
New Message  Pt daughter called states that the pt is summoned to jury duty. Request a letter sating that due to his heart related issues he is unable to attend. Please assist

## 2015-08-17 ENCOUNTER — Encounter: Payer: Self-pay | Admitting: *Deleted

## 2015-08-17 NOTE — Telephone Encounter (Signed)
Ok to write letter saying he cannot go to jury duty as his pacer needs to be checked

## 2015-08-17 NOTE — Telephone Encounter (Signed)
LETTER DONE   LM  MESSAGE  FOR PT   TO CALL BACK  .Zack Seal

## 2015-08-17 NOTE — Telephone Encounter (Signed)
I called and spoke with Lurena Joiner. She states that the patient is due for jury duty on 08/22/15 (Juror #- [id number]). I advised her that Dr. Graciela Husbands is out of the country and I will need to forward the message to Dr Eden Emms to see if he will do a letter for the patient. Dr. Eden Emms last saw the patient on 01/23/15. Dr. Graciela Husbands hasn't seen the patient since 08/09/14. Dr. Graciela Husbands is scheduled to see the patient on 08/22/15, which is the same day as his jury duty.

## 2015-08-17 NOTE — Telephone Encounter (Signed)
PT'S DAUGHTER  AWARE  LETTER   IS  DONE  AND  WILL LEAVE  AT  FRONT  DESK  FOR  PICK UP .Zack Seal

## 2015-08-22 ENCOUNTER — Encounter: Payer: Self-pay | Admitting: Internal Medicine

## 2015-08-22 ENCOUNTER — Ambulatory Visit (INDEPENDENT_AMBULATORY_CARE_PROVIDER_SITE_OTHER): Payer: Medicare Other | Admitting: Internal Medicine

## 2015-08-22 VITALS — BP 130/84 | HR 87 | Ht 69.5 in | Wt 195.2 lb

## 2015-08-22 DIAGNOSIS — I10 Essential (primary) hypertension: Secondary | ICD-10-CM | POA: Diagnosis not present

## 2015-08-22 DIAGNOSIS — I442 Atrioventricular block, complete: Secondary | ICD-10-CM | POA: Diagnosis not present

## 2015-08-22 DIAGNOSIS — G478 Other sleep disorders: Secondary | ICD-10-CM

## 2015-08-22 DIAGNOSIS — Z45018 Encounter for adjustment and management of other part of cardiac pacemaker: Secondary | ICD-10-CM

## 2015-08-22 DIAGNOSIS — G4730 Sleep apnea, unspecified: Secondary | ICD-10-CM

## 2015-08-22 NOTE — Progress Notes (Signed)
      Patient Care Team: Pincus Large, DO as PCP - General   HPI  Robert Flynn is a 69 y.o. male Seen in followup for pacer implanted 11/13 for symptomatic 2:1-3:1 Heart block  he now has complete heart block.   He denies chest pain or edema. He has sleep disordered breathing and daytime somnolence.   Past Medical History  Diagnosis Date  . Gout   . Hypertension   . OBESITY, NOS   . ANXIETY   . ADJUSTMENT DISORDER WITHOUT DEPRESSED MOOD   . EXTERNAL HEMORRHOIDS   . OSTEOARTHRITIS, MULTI SITES   . Sinus bradycardia   . Right bundle branch block and left anterior fascicular block Dec 2013    Progressive heart block with exercise>>3:1; s/p PTVP  . DVT (deep venous thrombosis) (HCC)     of the right arm post pacemaker; on Xarelto for 3 months  . Pacemaker- [place]     DOI 11/13    Past Surgical History  Procedure Laterality Date  . Pacemaker insertion  December 2013  . Permanent pacemaker insertion N/A 08/03/2012    Procedure: PERMANENT PACEMAKER INSERTION;  Surgeon: Duke Salvia, MD;  Location: [facility] CATH LAB;  Service: Cardiovascular;  Laterality: N/A;    Current Outpatient Prescriptions  Medication Sig Dispense Refill  . amLODipine (NORVASC) 10 MG tablet Take 1 tablet (10 mg total) by mouth daily. 30 tablet 11  . aspirin 81 MG tablet Take 81 mg by mouth daily.      . colchicine (COLCRYS) 0.6 MG tablet TAKE TWO TABLETS BY MOUTH TWICE DAILY FOR  GOUT  FLARE 60 tablet 2  . KLOR-CON M10 10 MEQ tablet TAKE ONE TABLET BY MOUTH ONCE DAILY 30 tablet 6  . lisinopril-hydrochlorothiazide (PRINZIDE,ZESTORETIC) 20-12.5 MG tablet Take 1 tablet by mouth 2 (two) times daily. NEEDS PCP FOLLOW-UP 60 tablet 1  . simvastatin (ZOCOR) 40 MG tablet Take 1 tablet (40 mg total) by mouth at bedtime. 90 tablet 3   No current facility-administered medications for this visit.    No Known Allergies  Review of Systems negative except from HPI and PMH  Physical Exam BP 130/84 mmHg   Pulse 87  Ht 5' 9.5" (1.765 m)  Wt 195 lb 3.2 oz (88.542 kg)  BMI 28.42 kg/m2  SpO2 98% Well developed and nourished in no acute distress HENT normal Neck supple with JVP-flat Clear Device pocket well healed; without hematoma or erythema.  There is no tethering Regular rate and rhythm, no murmurs or gallops Abd-soft with active BS No Clubbing cyanosis edema Skin-warm and dry A & Oriented  Grossly normal sensory and motor function      Assessment and  Plan Complete heart block stable post pacing  Pacemaker [employer] The patient's device was interrogated.  The information was reviewed. VIP was inactivated  Hypertension  better   Sleep disordered breathing  Stable on current meds I'm concerned about his daytime somnolence with his snoring. His body habitus also supports the idea of sleep apnea. I will defer it to his PCP

## 2015-08-22 NOTE — Patient Instructions (Signed)
Medication Instructions: - no changes  Labwork: -none  Procedures/Testing: - none  Follow-Up: - Remote monitoring is used to monitor your Pacemaker of ICD from home. This monitoring reduces the number of office visits required to check your device to one time per year. It allows [place] to keep an eye on the functioning of your device to ensure it is working properly. You are scheduled for a device check from home on 11/21/15. You may send your transmission at any time that day. If you have a wireless device, the transmission will be sent automatically. After your physician reviews your transmission, you will receive a postcard with your next transmission date.  - Your physician wants you to follow-up in: 1 year with Dr. Graciela Husbands. You will receive a reminder letter in the mail two months in advance. If you don't receive a letter, please call our office to schedule the follow-up appointment.  Any Additional Special Instructions Will Be Listed Below (If Applicable). - none

## 2015-08-23 LAB — CUP PACEART INCLINIC DEVICE CHECK
Implantable Lead Implant Date: 20131118
Implantable Lead Location: 753860
Lead Channel Setting Pacing Amplitude: 2.5 V
Lead Channel Setting Pacing Pulse Width: 0.4 ms
MDC IDC LEAD IMPLANT DT: 20131118
MDC IDC LEAD LOCATION: 753859
MDC IDC SESS DTM: 20161207142755
MDC IDC SET LEADCHNL RA PACING AMPLITUDE: 2 V
MDC IDC SET LEADCHNL RV SENSING SENSITIVITY: 2 mV
Pulse Gen Model: 2110
Pulse Gen Serial Number: 7362469

## 2015-11-02 ENCOUNTER — Telehealth: Payer: Self-pay | Admitting: *Deleted

## 2015-11-02 NOTE — Telephone Encounter (Signed)
Called patient to offer flu vaccine, however, VM picked up.  Left message on patient's voice mail to return call. DUCATTE, LAURENZE L, RN   

## 2015-11-03 ENCOUNTER — Other Ambulatory Visit: Payer: Self-pay | Admitting: Obstetrics and Gynecology

## 2015-11-21 ENCOUNTER — Encounter: Payer: Medicare Other | Admitting: *Deleted

## 2015-11-24 ENCOUNTER — Encounter: Payer: Self-pay | Admitting: Cardiology

## 2016-01-05 ENCOUNTER — Other Ambulatory Visit: Payer: Self-pay | Admitting: Cardiovascular Disease

## 2016-02-05 ENCOUNTER — Ambulatory Visit: Payer: Medicare Other | Admitting: Obstetrics and Gynecology

## 2016-02-08 ENCOUNTER — Other Ambulatory Visit: Payer: Self-pay | Admitting: Obstetrics and Gynecology

## 2016-02-22 ENCOUNTER — Encounter: Payer: Self-pay | Admitting: Obstetrics and Gynecology

## 2016-02-22 ENCOUNTER — Ambulatory Visit (INDEPENDENT_AMBULATORY_CARE_PROVIDER_SITE_OTHER): Payer: Medicare Other | Admitting: Obstetrics and Gynecology

## 2016-02-22 VITALS — BP 134/80 | HR 80 | Temp 97.8°F | Ht 70.0 in | Wt 205.2 lb

## 2016-02-22 DIAGNOSIS — M150 Primary generalized (osteo)arthritis: Secondary | ICD-10-CM | POA: Diagnosis not present

## 2016-02-22 DIAGNOSIS — Z0000 Encounter for general adult medical examination without abnormal findings: Secondary | ICD-10-CM

## 2016-02-22 DIAGNOSIS — I10 Essential (primary) hypertension: Secondary | ICD-10-CM | POA: Diagnosis not present

## 2016-02-22 DIAGNOSIS — M159 Polyosteoarthritis, unspecified: Secondary | ICD-10-CM

## 2016-02-22 DIAGNOSIS — Z1159 Encounter for screening for other viral diseases: Secondary | ICD-10-CM

## 2016-02-22 DIAGNOSIS — E785 Hyperlipidemia, unspecified: Secondary | ICD-10-CM | POA: Diagnosis not present

## 2016-02-22 MED ORDER — POTASSIUM CHLORIDE CRYS ER 10 MEQ PO TBCR: 10.0000 meq | EXTENDED_RELEASE_TABLET | Freq: Every day | ORAL | Status: DC

## 2016-02-22 MED ORDER — LISINOPRIL-HYDROCHLOROTHIAZIDE 20-12.5 MG PO TABS: 1.0000 | ORAL_TABLET | Freq: Two times a day (BID) | ORAL | Status: DC

## 2016-02-22 MED ORDER — AMLODIPINE BESYLATE 10 MG PO TABS
10.0000 mg | ORAL_TABLET | Freq: Every day | ORAL | Status: DC
Start: 2016-02-22 — End: 2017-03-24

## 2016-02-22 NOTE — Assessment & Plan Note (Signed)
OA of multiple joints. Uses Aleve to help control pain. Uses copper wire around wrist as well. Currently stable and without complaint.

## 2016-02-22 NOTE — Progress Notes (Signed)
     Subjective: Chief Complaint  Patient presents with  . Hypertension     HPI: Robert Flynn is a 70 y.o. presenting to clinic today to discuss the following:  #Hypertension Blood pressure at home: not taking Blood pressure today: at goal on repeat Taking Meds: yes, needs refill Side effects: none ROS: Denies headache, dizziness, visual changes, nausea, vomiting, chest pain, abdominal pain or shortness of breath.  #Gout Hasnt had a flare since last year Avoiding certain foods has helped  #HLD  Taking medicine Denies myalgias  #OA  Wears copper wire bracelet that he believes has greatly improved his OA Denies any flares the last 3-4 months since starting to wear bracelet  Health Maintenance: Needs colonoscopy and Hep C screen    ROS noted in HPI.  Past Medical, Surgical, Social, and Family History Reviewed & Updated per EMR. Smoking status - Never smoker   Objective: BP 134/80 mmHg  Pulse 80  Temp(Src) 97.8 F (36.6 C) (Oral)  Ht 5\' 10"  (1.778 m)  Wt 205 lb 3.2 oz (93.078 kg)  BMI 29.44 kg/m2  SpO2 97% Vitals and nursing notes reviewed  Physical Exam  Constitutional: He is well-developed, well-nourished, and in no distress.  Cardiovascular: Normal rate and regular rhythm.   Pulmonary/Chest: Effort normal and breath sounds normal.  Musculoskeletal: Normal range of motion. He exhibits no edema or tenderness.  Skin: Skin is warm and dry.    Assessment/Plan: Please see problem based Assessment and Plan   Orders Placed This Encounter  Procedures  . Basic Metabolic Panel  . Hepatitis C antibody  . Ambulatory referral to Gastroenterology    Referral Priority:  Routine    Referral Type:  Consultation    Referral Reason:  Specialty Services Required    Number of Visits Requested:  1    Meds ordered this encounter  Medications  . lisinopril-hydrochlorothiazide (PRINZIDE,ZESTORETIC) 20-12.5 MG tablet    Sig: Take 1 tablet by mouth 2 (two) times  daily. NEEDS PCP FOLLOW-UP    Dispense:  60 tablet    Refill:  3  . potassium chloride (KLOR-CON M10) 10 MEQ tablet    Sig: Take 1 tablet (10 mEq total) by mouth daily.    Dispense:  30 tablet    Refill:  6    Please call and schedule an appointment  . amLODipine (NORVASC) 10 MG tablet    Sig: Take 1 tablet (10 mg total) by mouth daily.    Dispense:  60 tablet    Refill:  3     Caryl Ada, DO 02/22/2016, 1:42 PM PGY-2, [facility]

## 2016-02-22 NOTE — Patient Instructions (Signed)
I am pleased to hear that things are going well for you.  Here are some of the things we discussed today: -checking some labs today - will contact you about results -continue all medications as prescribed refills given -colonoscopy referral placed someone will contact you  Please schedule a follow-up appointment for 6 months   Thanks for allowing me to be a part of your care! Dr. Doroteo Glassman

## 2016-02-22 NOTE — Assessment & Plan Note (Signed)
No need for repeat lipid panel at this time. Patient also not fasting. Denies any myalgias or other side effects. Continue therapy. Reviewed last lipid panel and total cholesterol and LDL at goal.

## 2016-02-22 NOTE — Assessment & Plan Note (Signed)
Referral placed for screening colonoscopy. Blood work also collected to screen for Hep C.

## 2016-02-22 NOTE — Assessment & Plan Note (Signed)
Long-standing stable condition that is well controlled with medications. No adjustments to regimen today. Blood work obtained. Refills given.

## 2016-02-23 LAB — BASIC METABOLIC PANEL
BUN: 26 mg/dL — ABNORMAL HIGH (ref 7–25)
CALCIUM: 9.5 mg/dL (ref 8.6–10.3)
CO2: 25 mmol/L (ref 20–31)
CREATININE: 1.6 mg/dL — AB (ref 0.70–1.18)
Chloride: 104 mmol/L (ref 98–110)
Glucose, Bld: 106 mg/dL — ABNORMAL HIGH (ref 65–99)
Potassium: 3.7 mmol/L (ref 3.5–5.3)
Sodium: 141 mmol/L (ref 135–146)

## 2016-02-23 LAB — HEPATITIS C ANTIBODY: HCV Ab: NEGATIVE

## 2016-04-22 ENCOUNTER — Other Ambulatory Visit: Payer: Self-pay | Admitting: Obstetrics and Gynecology

## 2016-04-30 ENCOUNTER — Encounter: Payer: Self-pay | Admitting: Obstetrics and Gynecology

## 2016-08-28 ENCOUNTER — Encounter: Payer: Self-pay | Admitting: Internal Medicine

## 2016-09-04 ENCOUNTER — Ambulatory Visit (INDEPENDENT_AMBULATORY_CARE_PROVIDER_SITE_OTHER): Payer: Medicare Other | Admitting: Internal Medicine

## 2016-09-04 ENCOUNTER — Encounter: Payer: Self-pay | Admitting: Internal Medicine

## 2016-09-04 ENCOUNTER — Encounter (INDEPENDENT_AMBULATORY_CARE_PROVIDER_SITE_OTHER): Payer: Self-pay

## 2016-09-04 VITALS — BP 140/86 | HR 94 | Ht 69.0 in | Wt 200.0 lb

## 2016-09-04 DIAGNOSIS — I10 Essential (primary) hypertension: Secondary | ICD-10-CM

## 2016-09-04 DIAGNOSIS — Z950 Presence of cardiac pacemaker: Secondary | ICD-10-CM

## 2016-09-04 DIAGNOSIS — I442 Atrioventricular block, complete: Secondary | ICD-10-CM | POA: Diagnosis not present

## 2016-09-04 LAB — BASIC METABOLIC PANEL
BUN: 16 mg/dL (ref 7–25)
CO2: 29 mmol/L (ref 20–31)
Calcium: 9.4 mg/dL (ref 8.6–10.3)
Chloride: 104 mmol/L (ref 98–110)
Creat: 1.34 mg/dL — ABNORMAL HIGH (ref 0.70–1.18)
Glucose, Bld: 150 mg/dL — ABNORMAL HIGH (ref 65–99)
POTASSIUM: 3.5 mmol/L (ref 3.5–5.3)
SODIUM: 141 mmol/L (ref 135–146)

## 2016-09-04 LAB — CUP PACEART INCLINIC DEVICE CHECK
Brady Statistic RA Percent Paced: 27 %
Brady Statistic RV Percent Paced: 98 %
Implantable Lead Implant Date: 20131118
Implantable Lead Location: 753859
Implantable Lead Location: 753860
Lead Channel Impedance Value: 360 Ohm
Lead Channel Impedance Value: 540 Ohm
Lead Channel Pacing Threshold Pulse Width: 0.4 ms
Lead Channel Pacing Threshold Pulse Width: 0.4 ms
Lead Channel Sensing Intrinsic Amplitude: 4.3 mV
Lead Channel Setting Pacing Amplitude: 2 V
MDC IDC LEAD IMPLANT DT: 20131118
MDC IDC MSMT BATTERY VOLTAGE: 2.92 V
MDC IDC MSMT LEADCHNL RA PACING THRESHOLD AMPLITUDE: 1 V
MDC IDC MSMT LEADCHNL RV PACING THRESHOLD AMPLITUDE: 0.75 V
MDC IDC MSMT LEADCHNL RV SENSING INTR AMPL: 12 mV
MDC IDC PG IMPLANT DT: 20131118
MDC IDC SESS DTM: 20171220150403
MDC IDC SET LEADCHNL RV PACING AMPLITUDE: 2.5 V
MDC IDC SET LEADCHNL RV PACING PULSEWIDTH: 0.4 ms
MDC IDC SET LEADCHNL RV SENSING SENSITIVITY: 2 mV
Pulse Gen Serial Number: 7362469

## 2016-09-04 NOTE — Progress Notes (Signed)
      Patient Care Team: Pincus Large, DO as PCP - General   HPI  Robert Flynn is a 70 y.o. male Seen in followup for pacer implanted 11/13 for symptomatic 2:1-3:1 Heart block  he now has complete heart block.   He denies chest pain or edema Denies sob No syncope    Date      **7*/16    Cr  1.54      K   3.6     6/17    Cr  1.6      K   3.7       Past Medical History:  Diagnosis Date  . ADJUSTMENT DISORDER WITHOUT DEPRESSED MOOD   . ANXIETY   . DVT (deep venous thrombosis) (HCC)    of the right arm post pacemaker; on Xarelto for 3 months  . EXTERNAL HEMORRHOIDS   . Gout   . Hypertension   . OBESITY, NOS   . OSTEOARTHRITIS, MULTI SITES   . Pacemaker- [employer]    DOI 11/13  . Right bundle branch block and left anterior fascicular block Dec 2013   Progressive heart block with exercise>>3:1; s/p PTVP  . Sinus bradycardia     Past Surgical History:  Procedure Laterality Date  . PACEMAKER INSERTION  December 2013  . PERMANENT PACEMAKER INSERTION N/A 08/03/2012   Procedure: PERMANENT PACEMAKER INSERTION;  Surgeon: Duke Salvia, MD;  Location: [facility] CATH LAB;  Service: Cardiovascular;  Laterality: N/A;    Current Outpatient Prescriptions  Medication Sig Dispense Refill  . amLODipine (NORVASC) 10 MG tablet Take 1 tablet (10 mg total) by mouth daily. 60 tablet 3  . aspirin 81 MG tablet Take 81 mg by mouth daily.      . colchicine (COLCRYS) 0.6 MG tablet TAKE TWO TABLETS BY MOUTH TWICE DAILY FOR  GOUT  FLARE 60 tablet 2  . lisinopril-hydrochlorothiazide (PRINZIDE,ZESTORETIC) 20-12.5 MG tablet Take 1 tablet by mouth 2 (two) times daily. NEEDS PCP FOLLOW-UP 60 tablet 3  . potassium chloride (KLOR-CON M10) 10 MEQ tablet Take 1 tablet (10 mEq total) by mouth daily. 30 tablet 6  . simvastatin (ZOCOR) 40 MG tablet TAKE ONE TABLET BY MOUTH AT BEDTIME 30 tablet 6   No current facility-administered medications for this visit.     No Known Allergies  Review of Systems  negative except from HPI and PMH  Physical Exam BP 140/86   Pulse 94   Ht 5\' 9"  (1.753 m)   Wt 200 lb (90.7 kg)   SpO2 (!) 86%   BMI 29.53 kg/m  Well developed and nourished in no acute distress HENT normal Neck supple with JVP-flat Clear Device pocket well healed; without hematoma or erythema.  There is no tethering Regular rate and rhythm, no murmurs or gallops Abd-soft with active BS No Clubbing cyanosis edema Skin-warm and dry A & Oriented  Grossly normal sensory and motor function   ECG demonstrates P synchronous pacing with occasional PVCs    Assessment and  Plan Complete heart block stable post pacing  Pacemaker [employer] The patient's device was interrogated.  The information was reviewed. VIP was inactivated  Hypertension      PVCs      BP pretty well controlled  PVC not so frequent that they should be affecting his LV fucnton   Will check lytes today

## 2016-09-04 NOTE — Patient Instructions (Signed)
Medication Instructions: - Your physician recommends that you continue on your current medications as directed. Please refer to the Current Medication list given to you today.  Labwork: - Your physician recommends that you have lab work today: BMP  Procedures/Testing: - none ordered  Follow-Up: - Remote monitoring is used to monitor your Pacemaker of ICD from home. This monitoring reduces the number of office visits required to check your device to one time per year. It allows us to keep an eye on the functioning of your device to ensure it is working properly. You are scheduled for a device check from home on 12/04/16. You may send your transmission at any time that day. If you have a wireless device, the transmission will be sent automatically. After your physician reviews your transmission, you will receive a postcard with your next transmission date.  - Your physician wants you to follow-up in: 1 year with Dr. Klein. You will receive a reminder letter in the mail two months in advance. If you don't receive a letter, please call our office to schedule the follow-up appointment.  Any Additional Special Instructions Will Be Listed Below (If Applicable).     If you need a refill on your cardiac medications before your next appointment, please call your pharmacy.   

## 2016-09-27 ENCOUNTER — Ambulatory Visit (HOSPITAL_COMMUNITY)
Admission: RE | Admit: 2016-09-27 | Discharge: 2016-09-27 | Disposition: A | Discharge status: Home / Self Care | Payer: Medicare Other | Source: Ambulatory Visit | Attending: Family Medicine | Admitting: Family Medicine

## 2016-09-27 ENCOUNTER — Encounter: Payer: Self-pay | Admitting: Obstetrics and Gynecology

## 2016-09-27 ENCOUNTER — Ambulatory Visit (INDEPENDENT_AMBULATORY_CARE_PROVIDER_SITE_OTHER): Payer: Medicare Other | Admitting: Obstetrics and Gynecology

## 2016-09-27 VITALS — BP 130/76 | HR 105 | Temp 101.4°F | Ht 69.0 in | Wt 203.8 lb

## 2016-09-27 DIAGNOSIS — J189 Pneumonia, unspecified organism: Secondary | ICD-10-CM

## 2016-09-27 DIAGNOSIS — R05 Cough: Secondary | ICD-10-CM | POA: Diagnosis not present

## 2016-09-27 DIAGNOSIS — J984 Other disorders of lung: Secondary | ICD-10-CM | POA: Insufficient documentation

## 2016-09-27 DIAGNOSIS — R509 Fever, unspecified: Secondary | ICD-10-CM | POA: Diagnosis not present

## 2016-09-27 DIAGNOSIS — R059 Cough, unspecified: Secondary | ICD-10-CM

## 2016-09-27 MED ORDER — AZITHROMYCIN 250 MG PO TABS: ORAL_TABLET | ORAL | 0 refills | Status: DC

## 2016-09-27 MED ORDER — BENZONATATE 100 MG PO CAPS: 100.0000 mg | ORAL_CAPSULE | Freq: Three times a day (TID) | ORAL | 0 refills | Status: DC | PRN

## 2016-09-27 NOTE — Progress Notes (Signed)
   Subjective:   Patient ID: Robert Flynn, male    DOB: 09-04-1946, 71 y.o.   MRN: [id number]  Patient presents for Same Day Appointment  Chief Complaint  Patient presents with  . URI    HPI: Upper Respiratory Infection Patient complains of symptoms of a URI. Symptoms include low grade fever, non productive cough and chills. Denies abdominal pain, dyspnea, chest pain, n/v, and bodyaches. Onset of symptoms was 2 days ago, and has been stable since that time. Treatment to date: OTC decongestants. No other sick contacts. Did not receive flu vaccine this year.    Review of Systems   See HPI for ROS.   History  Smoking Status  . Never Smoker  Smokeless Tobacco  . Never Used    Past medical history, surgical, family, and social history reviewed and updated in the EMR as appropriate.  Objective:  BP 130/76 (BP Location: Right Arm, Patient Position: Sitting, Cuff Size: Large)   Pulse (!) 105   Temp (!) 101.4 F (38.6 C) (Oral)   Ht 5\' 9"  (1.753 m)   Wt 203 lb 12.8 oz (92.4 kg)   SpO2 97%   BMI 30.10 kg/m  Vitals and nursing note reviewed  Physical Exam  Constitutional: He has a sickly appearance.  HENT:  Mouth/Throat: Oropharynx is clear and moist. Mucous membranes are dry.  Eyes: Conjunctivae and EOM are normal. Pupils are equal, round, and reactive to light.  Neck: Normal range of motion. Neck supple.  Cardiovascular: Normal rate and regular rhythm.   Pacemaker  Pulmonary/Chest: Effort normal. He has no wheezes. He has rales.  Lymphadenopathy:    He has no cervical adenopathy.   Dg Chest 2 View  Result Date: 09/27/2016 CLINICAL DATA:  Cough and fever for 1 day possible pneumonia EXAM: CHEST  2 VIEW COMPARISON:  08/04/2012 FINDINGS: Cardiomediastinal silhouette is stable. Dual lead cardiac pacemaker is unchanged in position. Streaky bilateral basilar atelectasis or infiltrate left greater than right. No pulmonary edema. Mild degenerative changes lower thoracic spine.  IMPRESSION: Streaky bilateral basilar atelectasis or infiltrate left greater than right. No pulmonary edema. Electronically Signed   By: Natasha Mead M.D.   On: 09/27/2016 11:15    Assessment & Plan:  1. Cough with fever Concern for pneumonia with symptoms and physical exam. Patient ill-appearing but stable. Febrile in clinic. Ordered Chest x-ray. Rx for cough medicine given.   2. Community acquired pneumonia, unspecified laterality Results of chest x-ray returned showing CAP. Please see impression above. Perfusion imaging personally. Will treat with Z-Pak. Encouraged continued hydration. Counseled patient on return precautions.   PATIENT EDUCATION PROVIDED: See AVS   Caryl Ada, DO 09/27/2016, 9:45 AM PGY-3, [facility]

## 2016-09-27 NOTE — Patient Instructions (Addendum)
Go to hospital now to get chest x-ray Will call and discuss results by the end of day - may have a pneumonia Cough medicine sent to pharmacy Take some tylenol or ibuprofen for fever and discomfort   Hope you feel better soon

## 2016-11-23 ENCOUNTER — Encounter (HOSPITAL_COMMUNITY): Payer: Self-pay | Admitting: Nurse Practitioner

## 2016-11-23 ENCOUNTER — Emergency Department (HOSPITAL_COMMUNITY)
Admission: EM | Admit: 2016-11-23 | Discharge: 2016-11-23 | Disposition: A | Discharge status: Home / Self Care | Payer: 59 | Attending: Emergency Medicine | Admitting: Emergency Medicine

## 2016-11-23 ENCOUNTER — Emergency Department (HOSPITAL_COMMUNITY): Payer: 59

## 2016-11-23 DIAGNOSIS — I10 Essential (primary) hypertension: Secondary | ICD-10-CM | POA: Insufficient documentation

## 2016-11-23 DIAGNOSIS — M109 Gout, unspecified: Secondary | ICD-10-CM | POA: Insufficient documentation

## 2016-11-23 DIAGNOSIS — Z7982 Long term (current) use of aspirin: Secondary | ICD-10-CM | POA: Diagnosis not present

## 2016-11-23 DIAGNOSIS — Z950 Presence of cardiac pacemaker: Secondary | ICD-10-CM | POA: Diagnosis not present

## 2016-11-23 DIAGNOSIS — M7989 Other specified soft tissue disorders: Secondary | ICD-10-CM | POA: Diagnosis present

## 2016-11-23 DIAGNOSIS — M79642 Pain in left hand: Secondary | ICD-10-CM | POA: Diagnosis not present

## 2016-11-23 MED ORDER — PREDNISONE 20 MG PO TABS: 20.0000 mg | ORAL_TABLET | Freq: Every day | ORAL | 0 refills | Status: AC

## 2016-11-23 MED ORDER — INDOMETHACIN 50 MG PO CAPS: 50.0000 mg | ORAL_CAPSULE | Freq: Three times a day (TID) | ORAL | 0 refills | Status: AC

## 2016-11-23 NOTE — ED Provider Notes (Signed)
The patient is a 71 year old male with a history of acute gouty arthritis, he cannot afford the preventative medications unless he gets intermittent episodes of gout. He reports that he has had some gradual onset of left wrist swelling, it is painful to move the hand, he does not have any pain in the fingers or any other joints including elbow shoulders hips knees or ankles or toes. In the past she's had this primarily in his toes.  On exam the patient doesn't fact have a small amount of swelling and tenderness over the dorsum of his left hand. There is no skin change to suggest cellulitis, no skin injuries, he has full range of motion of all other joints and has no fevers.  Likely acute gouty arthritis, patient stable for discharge on indomethacin and prednisone. Patient scraped his understanding. Can follow-up in the outpatient setting.  Medical screening examination/treatment/procedure(s) were conducted as a shared visit with non-physician practitioner(s) and myself.  I personally evaluated the patient during the encounter.  Clinical Impression:   Final diagnoses:  Acute gout of left wrist, unspecified cause         Eber Hong, MD 11/24/16 1023

## 2016-11-23 NOTE — ED Provider Notes (Signed)
[facility]-EMERGENCY DEPT Provider Note   CSN: [id number] Arrival date & time: 11/23/16  1327     History   Chief Complaint Chief Complaint  Patient presents with  . Hand Pain    HPI Robert Flynn is a 71 y.o. male.  He presents today with a left hand that he states is swollen and painful and red. He first noticed this about 3 days ago he denies any trauma and states that he has no clue what is going on. 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 The pain has been gradually getting worse and is a 10 out of 10 and he is unable to describe the pain other than that it hurts.  He reports that moving his wrist and hand make his pain increased he has tried taking 400 mg of ibuprofen for the pain without any relief.  He reports that the pain does not radiate and has no pain in his forearm or arm. He reports that he does have a history of gout with his last episode being multiple years ago. He reports that he has only had gout in his feet and toes.  He reports that he has not been nauseous has had not had fevers, general malaise or any other constitutional symptoms.      Past Medical History:  Diagnosis Date  . ADJUSTMENT DISORDER WITHOUT DEPRESSED MOOD   . ANXIETY   . DVT (deep venous thrombosis) (HCC)    of the right arm post pacemaker; on Xarelto for 3 months  . EXTERNAL HEMORRHOIDS   . Gout   . Hypertension   . OBESITY, NOS   . OSTEOARTHRITIS, MULTI SITES   . Pacemaker- [employer]    DOI 11/13  . Right bundle branch block and left anterior fascicular block Dec 2013   Progressive heart block with exercise>>3:1; s/p PTVP  . Sinus bradycardia     Patient Active Problem List   Diagnosis Date Noted  . Hyperlipidemia 02/28/2014  . Health care maintenance 02/28/2014  . Pacemaker-[employer] 08/04/2012  . Atrioventricular block, complete (HCC) 07/09/2012  . CARDIAC MURMUR 03/27/2007  . Gout 11/13/2006  . OBESITY, NOS 11/13/2006  . HYPERTENSION, BENIGN SYSTEMIC  11/13/2006  . IMPOTENCE, ORGANIC 11/13/2006  . Osteoarthritis 11/13/2006    Past Surgical History:  Procedure Laterality Date  . PACEMAKER INSERTION  December 2013  . PERMANENT PACEMAKER INSERTION N/A 08/03/2012   Procedure: PERMANENT PACEMAKER INSERTION;  Surgeon: Duke Salvia, MD;  Location: [facility] CATH LAB;  Service: Cardiovascular;  Laterality: N/A;       Home Medications    Prior to Admission medications   Medication Sig Start Date End Date Taking? Authorizing Provider  amLODipine (NORVASC) 10 MG tablet Take 1 tablet (10 mg total) by mouth daily. 02/22/16   Pincus Large, DO  aspirin 81 MG tablet Take 81 mg by mouth daily.      Historical Provider, MD  azithromycin (ZITHROMAX) 250 MG tablet 500 mg (2 tabs) on day 1 followed by 250 mg (1 tab) once daily on days 2 to 5 09/27/16   Jazma Y Phelps, DO  benzonatate (TESSALON) 100 MG capsule Take 1-2 capsules (100-200 mg total) by mouth 3 (three) times daily as needed for cough. 09/27/16   Pincus Large, DO  colchicine (COLCRYS) 0.6 MG tablet TAKE TWO TABLETS BY MOUTH TWICE DAILY FOR  GOUT  FLARE 08/01/14   Pincus Large, DO  indomethacin (INDOCIN) 50 MG capsule Take 1 capsule (  50 mg total) by mouth 3 (three) times daily with meals. 11/23/16 11/30/16  Cristina Gong, PA  lisinopril-hydrochlorothiazide (PRINZIDE,ZESTORETIC) 20-12.5 MG tablet Take 1 tablet by mouth 2 (two) times daily. NEEDS PCP FOLLOW-UP 02/22/16   Pincus Large, DO  potassium chloride (KLOR-CON M10) 10 MEQ tablet Take 1 tablet (10 mEq total) by mouth daily. 02/22/16   Pincus Large, DO  predniSONE (DELTASONE) 20 MG tablet Take 1 tablet (20 mg total) by mouth daily with breakfast. 11/23/16 11/28/16  Cristina Gong, PA  simvastatin (ZOCOR) 40 MG tablet TAKE ONE TABLET BY MOUTH AT BEDTIME 04/22/16   Pincus Large, DO    Family History Family History  Problem Relation Age of Onset  . Heart disease Brother     Social History Social History  Substance Use Topics  .  Smoking status: Never Smoker  . Smokeless tobacco: Never Used  . Alcohol use 1.0 oz/week    2 drink(s) per week     Allergies   Patient has no known allergies.   Review of Systems Review of Systems  Constitutional: Negative for appetite change, fatigue and fever.  Musculoskeletal: Positive for arthralgias and joint swelling.  Skin: Positive for color change. Negative for pallor, rash and wound.  Neurological: Negative for weakness and numbness.     Physical Exam Updated Vital Signs BP 159/92 (BP Location: Right Arm)   Pulse 83   Temp 98 F (36.7 C) (Oral)   Resp 18   SpO2 99%   Physical Exam  Constitutional: He appears well-developed and well-nourished. No distress.  HENT:  Head: Normocephalic and atraumatic.  Eyes: Conjunctivae are normal.  Neck: No tracheal deviation present.  Cardiovascular: Normal rate.   Pulmonary/Chest: Effort normal.  Musculoskeletal:       Left wrist: He exhibits decreased range of motion, tenderness and swelling. He exhibits no effusion, no deformity and no laceration.  Neurological: He is alert.  Skin: Skin is warm and dry. He is not diaphoretic.     ED Treatments / Results  Labs (all labs ordered are listed, but only abnormal results are displayed) Labs Reviewed - No data to display  EKG  EKG Interpretation None       Radiology Dg Hand Complete Left  Result Date: 11/23/2016 CLINICAL DATA:  Left hand pain and swelling which began approximately 3 days ago. No known injury. EXAM: LEFT HAND - COMPLETE 3+ VIEW COMPARISON:  None. FINDINGS: No fracture or dislocation. Degenerative change of several intercarpal articulations with joint space loss, subchondral sclerosis and osteophytosis. Mild degenerative change involving the IP joint of the thumb with joint space loss, subchondral sclerosis and osteophytosis. There is a centrally lucent, peripherally sclerotic lesion within distal aspect proximal phalanx of the thumb, likely  representative of a subchondral cyst. Remaining joint spaces appear preserved. Regional soft tissues appear normal. No radiopaque foreign body. IMPRESSION: 1. No definite acute findings. 2. Degenerative change of the wrist and hand as above. Electronically Signed   By: Simonne Come M.D.   On: 11/23/2016 14:41    Procedures Procedures (including critical care time)  Medications Ordered in ED Medications - No data to display   Initial Impression / Assessment and Plan / ED Course  I have reviewed the triage vital signs and the nursing notes.  Pertinent labs & imaging results that were available during my care of the patient were reviewed by me and considered in my medical decision making (see chart for details).    Patient presents  with monoarticular joint pain.  He denies any trauma and x-ray shows no concern for acute osseus abnormality.  Patient had no wounds that would be a possible source of infection.  Given patients history of gout, lack of constitutional symptoms consistent with infection and lack of trauma, this appears to be most consistent with acute gouty arthritis of the left wrist.  Patient given strict return precautions and voiced understanding.  Patient had previously been treated with colchicine for gout but asked for a less expensive medication.  Labs reviewed to check kidney function and indomethacin was prescribed along with prednisone for treatment and reduction of inflammation.    Final Clinical Impressions(s) / ED Diagnoses   Final diagnoses:  Acute gout of left wrist, unspecified cause    New Prescriptions Discharge Medication List as of 11/23/2016  3:58 PM    START taking these medications   Details  indomethacin (INDOCIN) 50 MG capsule Take 1 capsule (50 mg total) by mouth 3 (three) times daily with meals., Starting Sat 11/23/2016, Until Sat 11/30/2016, Print    predniSONE (DELTASONE) 20 MG tablet Take 1 tablet (20 mg total) by mouth daily with breakfast., Starting  Sat 11/23/2016, Until Thu 11/28/2016, Print         Earnstine Regal [place], [place] 11/23/16 1848    Eber Hong, MD 11/24/16 1024

## 2016-11-23 NOTE — ED Triage Notes (Signed)
Pt presents with c/o L hand pain and swelling. The pain began about 3 days ago. He does not remember injuring the hand. He reports repetitive motions of the hand at work. He tried ibuprofen and heat with some improvement

## 2016-12-03 ENCOUNTER — Other Ambulatory Visit: Payer: Self-pay | Admitting: Obstetrics and Gynecology

## 2016-12-04 ENCOUNTER — Encounter: Payer: Medicare Other | Admitting: *Deleted

## 2016-12-04 ENCOUNTER — Telehealth: Payer: Self-pay | Admitting: Cardiology

## 2016-12-04 NOTE — Telephone Encounter (Signed)
Attempted to confirm remote transmission with pt. No answer and was unable to leave a message.   

## 2016-12-06 ENCOUNTER — Encounter: Payer: Self-pay | Admitting: Cardiology

## 2016-12-10 ENCOUNTER — Other Ambulatory Visit: Payer: Self-pay | Admitting: Obstetrics and Gynecology

## 2016-12-10 NOTE — Telephone Encounter (Signed)
Pt calling to request refill of:  Name of Medication(s):  Pt did not know the name of medication, said it is for hand swelling  Last date of OV:  09-27-16 [facility]  Will route refill request to Clinic RN.  Discussed with patient policy to call pharmacy for future refills.  Also, discussed refills may take up to 48 hours to approve or deny.  Markus Jarvis  Pt would also like PCP to call him. ep

## 2016-12-10 NOTE — Telephone Encounter (Signed)
Unsure what medication patient ir referring to. I just refilled this medication 6 days ago with refills so doubt this is the right medication. Please get more details from patient if he wants a refill.

## 2017-03-24 ENCOUNTER — Telehealth: Payer: Self-pay | Admitting: Family Medicine

## 2017-03-24 ENCOUNTER — Other Ambulatory Visit: Payer: Self-pay | Admitting: Obstetrics and Gynecology

## 2017-03-24 ENCOUNTER — Other Ambulatory Visit: Payer: Self-pay | Admitting: Family Medicine

## 2017-03-24 DIAGNOSIS — Z1211 Encounter for screening for malignant neoplasm of colon: Secondary | ICD-10-CM

## 2017-03-24 NOTE — Telephone Encounter (Signed)
Pt called to check the status of his request to get a colonoscopy. Please let him know. jw

## 2017-03-24 NOTE — Telephone Encounter (Signed)
Referral made 

## 2017-03-24 NOTE — Telephone Encounter (Signed)
Will you place a referral for patient? His last GI referral has expired. Thank you!

## 2017-03-24 NOTE — Progress Notes (Signed)
Referral made to GI for colonoscopy

## 2017-03-24 NOTE — Telephone Encounter (Signed)
Attempted to contact pt to inform of referral made. Pt did not answer and no option for VM. If pt calls back please inform her of referral made.

## 2017-05-29 ENCOUNTER — Encounter: Payer: Self-pay | Admitting: Family Medicine

## 2017-06-20 ENCOUNTER — Ambulatory Visit: Payer: Medicare Other | Admitting: Family Medicine

## 2017-06-27 ENCOUNTER — Encounter: Payer: Self-pay | Admitting: Family Medicine

## 2017-06-27 ENCOUNTER — Ambulatory Visit (INDEPENDENT_AMBULATORY_CARE_PROVIDER_SITE_OTHER): Payer: Medicare Other | Admitting: Family Medicine

## 2017-06-27 VITALS — BP 160/80 | HR 64 | Temp 98.7°F | Ht 69.0 in | Wt 201.0 lb

## 2017-06-27 DIAGNOSIS — Z23 Encounter for immunization: Secondary | ICD-10-CM

## 2017-06-27 DIAGNOSIS — E782 Mixed hyperlipidemia: Secondary | ICD-10-CM | POA: Diagnosis not present

## 2017-06-27 DIAGNOSIS — Z1211 Encounter for screening for malignant neoplasm of colon: Secondary | ICD-10-CM | POA: Diagnosis not present

## 2017-06-27 DIAGNOSIS — I10 Essential (primary) hypertension: Secondary | ICD-10-CM | POA: Diagnosis not present

## 2017-06-27 NOTE — Progress Notes (Addendum)
   [facility]           Phone: [phone]  Date of Visit: 06/27/2017   HPI:  Patient presents today for a well adult male exam.   Concerns today: none Exercise: Active at work Diet: Working to reduce foods that cause gout flares, low salt, less fried food Smoking: Never Alcohol: Occasionally  Drugs: None Mood: Good, no concerns   ROS: See HPI  PMFSH:  Cancers in family: no family history reported   PHYSICAL EXAM: BP (!) 160/80   Pulse 64   Temp 98.7 F (37.1 C) (Oral)   Ht 5\' 9"  (1.753 m)   Wt 201 lb (91.2 kg)   SpO2 98%   BMI 29.68 kg/m  Gen: NAD, pleasant, cooperative HEENT: NCAT, PERRL, no palpable thyromegaly or anterior cervical lymphadenopathy Heart: RRR, no murmurs Lungs: CTAB, NWOB Abdomen: soft, nontender to palpation Neuro: grossly nonfocal, speech normal   ASSESSMENT/PLAN:  The patient overall reports good health and has no current complaints.  #Hypertension: The patient's blood pressure was elevated in clinic today. However, he reports seeing lower numbers when he checks his blood pressure at [facility] (generally 139 systolic). In addition, he endorses taking all of his blood pressure medicines as prescribed. Based on his report of good blood pressure control at home and his overall good health and the risks of overtreating blood pressure in a patient >70, we will not make changes to his medications at this visit. However, if his blood pressure remains elevated at the next follow up visit, medications changes should be considered.  -check CBC, BMP today  #HLD- on statin -check lipid panel today  #Health maintenance:  -immunizations- flu shot and pneumonia vaccine given today -colonoscopy: sent referral for Colonoscopy to GI   FOLLOW UP: Follow up in 6 months for routine check and blood pressure management  Tillman Sers, DO

## 2017-06-27 NOTE — Patient Instructions (Addendum)
It was great seeing you today!  Please call to schedule your colonoscopy!  We will call you if there are any lab results we need to talk about, otherwise send you a letter if all is normal.   If you have questions or concerns please do not hesitate to call at [phone].  Dolores Patty, DO PGY-2, [facility] 06/27/2017 11:17 AM   Health Maintenance, Male A healthy lifestyle and preventive care is important for your health and wellness. Ask your health care provider about what schedule of regular examinations is right for you. What should I know about weight and diet? Eat a Healthy Diet  Eat plenty of vegetables, fruits, whole grains, low-fat dairy products, and lean protein.  Do not eat a lot of foods high in solid fats, added sugars, or salt.  Maintain a Healthy Weight Regular exercise can help you achieve or maintain a healthy weight. You should:  Do at least 150 minutes of exercise each week. The exercise should increase your heart rate and make you sweat (moderate-intensity exercise).  Do strength-training exercises at least twice a week.  Watch Your Levels of Cholesterol and Blood Lipids  Have your blood tested for lipids and cholesterol every 5 years starting at 73 years of age. If you are at high risk for heart disease, you should start having your blood tested when you are 73 years old. You may need to have your cholesterol levels checked more often if: ? Your lipid or cholesterol levels are high. ? You are older than 73 years of age. ? You are at high risk for heart disease.  What should I know about cancer screening? Many types of cancers can be detected early and may often be prevented. Lung Cancer  You should be screened every year for lung cancer if: ? You are a current smoker who has smoked for at least 30 years. ? You are a former smoker who has quit within the past 15 years.  Talk to your health care provider about your screening  options, when you should start screening, and how often you should be screened.  Colorectal Cancer  Routine colorectal cancer screening usually begins at 73 years of age and should be repeated every 5-10 years until you are 73 years old. You may need to be screened more often if early forms of precancerous polyps or small growths are found. Your health care provider may recommend screening at an earlier age if you have risk factors for colon cancer.  Your health care provider may recommend using home test kits to check for hidden blood in the stool.  A small camera at the end of a tube can be used to examine your colon (sigmoidoscopy or colonoscopy). This checks for the earliest forms of colorectal cancer.  Prostate and Testicular Cancer  Depending on your age and overall health, your health care provider may do certain tests to screen for prostate and testicular cancer.  Talk to your health care provider about any symptoms or concerns you have about testicular or prostate cancer.  Skin Cancer  Check your skin from head to toe regularly.  Tell your health care provider about any new moles or changes in moles, especially if: ? There is a change in a mole's size, shape, or color. ? You have a mole that is larger than a pencil eraser.  Always use sunscreen. Apply sunscreen liberally and repeat throughout the day.  Protect yourself by wearing long sleeves, pants, a wide-brimmed  hat, and sunglasses when outside.  What should I know about heart disease, diabetes, and high blood pressure?  If you are 70-50 years of age, have your blood pressure checked every 3-5 years. If you are 72 years of age or older, have your blood pressure checked every year. You should have your blood pressure measured twice-once when you are at a hospital or clinic, and once when you are not at a hospital or clinic. Record the average of the two measurements. To check your blood pressure when you are not at a hospital  or clinic, you can use: ? An automated blood pressure machine at a pharmacy. ? A home blood pressure monitor.  Talk to your health care provider about your target blood pressure.  If you are between 40-15 years old, ask your health care provider if you should take aspirin to prevent heart disease.  Have regular diabetes screenings by checking your fasting blood sugar level. ? If you are at a normal weight and have a low risk for diabetes, have this test once every three years after the age of 58. ? If you are overweight and have a high risk for diabetes, consider being tested at a younger age or more often.  A one-time screening for abdominal aortic aneurysm (AAA) by ultrasound is recommended for men aged 65-75 years who are current or former smokers.  Sexually Transmitted Diseases (STDs)  You should be screened each year for STDs including gonorrhea and chlamydia if: ? You are sexually active and are younger than 73 years of age. ? You are older than 73 years of age and your health care provider tells you that you are at risk for this type of infection. ? Your sexual activity has changed since you were last screened and you are at an increased risk for chlamydia or gonorrhea. Ask your health care provider if you are at risk.  Talk with your health care provider about whether you are at high risk of being infected with HIV. Your health care provider may recommend a prescription medicine to help prevent HIV infection.  What else can I do?  Schedule regular health, dental, and eye exams.  Stay current with your vaccines (immunizations).  Do not use any tobacco products, such as cigarettes, chewing tobacco, and e-cigarettes. If you need help quitting, ask your health care provider.  Limit alcohol intake to no more than 2 drinks per day. One drink equals 12 ounces of beer, 5 ounces of wine, or 1 ounces of hard liquor.  Do not use street drugs.  Do not share needles.  Ask your health  care provider for help if you need support or information about quitting drugs.  Tell your health care provider if you often feel depressed.  Tell your health care provider if you have ever been abused or do not feel safe at home. This information is not intended to replace advice given to you by your health care provider. Make sure you discuss any questions you have with your health care provider. Document Released: 02/29/2008 Document Revised: 05/01/2016 Document Reviewed: 06/06/2015 Elsevier Interactive Patient Education  [employer].

## 2017-06-29 LAB — LIPID PANEL
CHOL/HDL RATIO: 4.8 ratio (ref 0.0–5.0)
Cholesterol, Total: 155 mg/dL (ref 100–199)
HDL: 32 mg/dL — AB (ref >39)
LDL Calculated: 94 mg/dL (ref 0–99)
TRIGLYCERIDES: 145 mg/dL (ref 0–149)
VLDL CHOLESTEROL CAL: 29 mg/dL (ref 5–40)

## 2017-06-29 LAB — CBC
HEMATOCRIT: 41.4 % (ref 37.5–51.0)
Hemoglobin: 13.3 g/dL (ref 13.0–17.7)
MCH: 29 pg (ref 26.6–33.0)
MCHC: 32.1 g/dL (ref 31.5–35.7)
MCV: 90 fL (ref 79–97)
PLATELETS: 140 10*3/uL — AB (ref 150–379)
RBC: 4.59 x10E6/uL (ref 4.14–5.80)
RDW: 16.4 % — ABNORMAL HIGH (ref 12.3–15.4)
WBC: 4.8 10*3/uL (ref 3.4–10.8)

## 2017-06-29 LAB — BASIC METABOLIC PANEL
BUN/Creatinine Ratio: 7 — ABNORMAL LOW (ref 10–24)
BUN: 9 mg/dL (ref 8–27)
CO2: 27 mmol/L (ref 20–29)
CREATININE: 1.27 mg/dL (ref 0.76–1.27)
Calcium: 9.1 mg/dL (ref 8.6–10.2)
Chloride: 105 mmol/L (ref 96–106)
GFR calc Af Amer: 64 mL/min/{1.73_m2} (ref >59)
GFR calc non Af Amer: 56 mL/min/{1.73_m2} — ABNORMAL LOW (ref >59)
GLUCOSE: 110 mg/dL — AB (ref 65–99)
Potassium: 4.1 mmol/L (ref 3.5–5.2)
Sodium: 144 mmol/L (ref 134–144)

## 2017-06-30 ENCOUNTER — Encounter: Payer: Self-pay | Admitting: Family Medicine

## 2017-08-11 ENCOUNTER — Encounter: Payer: Self-pay | Admitting: Family Medicine

## 2017-09-17 ENCOUNTER — Ambulatory Visit (INDEPENDENT_AMBULATORY_CARE_PROVIDER_SITE_OTHER): Payer: Medicare Other | Admitting: Internal Medicine

## 2017-09-17 ENCOUNTER — Encounter: Payer: Self-pay | Admitting: Internal Medicine

## 2017-09-17 VITALS — BP 154/82 | HR 82 | Ht 69.0 in | Wt 203.4 lb

## 2017-09-17 DIAGNOSIS — Z950 Presence of cardiac pacemaker: Secondary | ICD-10-CM | POA: Diagnosis not present

## 2017-09-17 DIAGNOSIS — I428 Other cardiomyopathies: Secondary | ICD-10-CM

## 2017-09-17 DIAGNOSIS — I442 Atrioventricular block, complete: Secondary | ICD-10-CM | POA: Diagnosis not present

## 2017-09-17 DIAGNOSIS — I10 Essential (primary) hypertension: Secondary | ICD-10-CM | POA: Diagnosis not present

## 2017-09-17 LAB — CUP PACEART INCLINIC DEVICE CHECK
Battery Remaining Longevity: 73 mo
Battery Voltage: 2.9 V
Brady Statistic RA Percent Paced: 29 %
Implantable Lead Implant Date: 20131118
Implantable Lead Location: 753859
Implantable Pulse Generator Implant Date: 20131118
Lead Channel Impedance Value: 475 Ohm
Lead Channel Pacing Threshold Amplitude: 0.75 V
Lead Channel Pacing Threshold Amplitude: 0.75 V
Lead Channel Pacing Threshold Amplitude: 0.75 V
Lead Channel Pacing Threshold Amplitude: 0.75 V
Lead Channel Pacing Threshold Pulse Width: 0.4 ms
Lead Channel Setting Pacing Amplitude: 2 V
Lead Channel Setting Pacing Amplitude: 2.5 V
Lead Channel Setting Sensing Sensitivity: 2 mV
MDC IDC LEAD IMPLANT DT: 20131118
MDC IDC LEAD LOCATION: 753860
MDC IDC MSMT LEADCHNL RA IMPEDANCE VALUE: 375 Ohm
MDC IDC MSMT LEADCHNL RA PACING THRESHOLD PULSEWIDTH: 0.4 ms
MDC IDC MSMT LEADCHNL RA SENSING INTR AMPL: 5 mV
MDC IDC MSMT LEADCHNL RV PACING THRESHOLD PULSEWIDTH: 0.4 ms
MDC IDC MSMT LEADCHNL RV PACING THRESHOLD PULSEWIDTH: 0.4 ms
MDC IDC SESS DTM: 20190102092553
MDC IDC SET LEADCHNL RV PACING PULSEWIDTH: 0.4 ms
MDC IDC STAT BRADY RV PERCENT PACED: 99 %
Pulse Gen Serial Number: 7362469

## 2017-09-17 NOTE — Progress Notes (Signed)
Patient Care Team: Tillman Sers, DO as PCP - General   HPI  Robert Flynn is a 72 y.o. male With Chief Complaint of f  pacer implanted 11/13 for symptomatic 2:1-3:1 Heart block;  he now has complete heart block.    Date Cr K LDL  7/16 1.54 3.6   6/17 1.6 3.7   10/18 1.27 4.1 94   DATE TEST    10/13 Echo    EF 60+65 %   11/13 .myoview   EF 62 % No ischemia Personally reviewed          Working part-time.  Denies chest pain or edema.  If he climbs a couple flights of stairs back and forth he notes some dyspnea.  This is unchanged.  Past Medical History:  Diagnosis Date  . ADJUSTMENT DISORDER WITHOUT DEPRESSED MOOD   . ANXIETY   . DVT (deep venous thrombosis) (HCC)    of the right arm post pacemaker; on Xarelto for 3 months  . EXTERNAL HEMORRHOIDS   . Gout   . Hypertension   . OBESITY, NOS   . OSTEOARTHRITIS, MULTI SITES   . Pacemaker- [employer]    DOI 11/13  . Right bundle branch block and left anterior fascicular block Dec 2013   Progressive heart block with exercise>>3:1; s/p PTVP  . Sinus bradycardia     Past Surgical History:  Procedure Laterality Date  . PACEMAKER INSERTION  December 2013  . PERMANENT PACEMAKER INSERTION N/A 08/03/2012   Procedure: PERMANENT PACEMAKER INSERTION;  Surgeon: Duke Salvia, MD;  Location: [facility] CATH LAB;  Service: Cardiovascular;  Laterality: N/A;    Current Outpatient Medications  Medication Sig Dispense Refill  . amLODipine (NORVASC) 10 MG tablet TAKE ONE TABLET BY MOUTH ONCE DAILY 30 tablet 7  . aspirin 81 MG tablet Take 81 mg by mouth daily.      Marland Kitchen COLCRYS 0.6 MG tablet TAKE TWO TABLETS BY MOUTH TWICE DAILY FOR  GOUT  FLARE 7 tablet 3  . KLOR-CON M10 10 MEQ tablet TAKE ONE TABLET BY MOUTH ONCE DAILY. MUST HAVE OFFICE VISIT BEFORE FURTHER REFILLS. 30 tablet 6  . lisinopril-hydrochlorothiazide (PRINZIDE,ZESTORETIC) 20-12.5 MG tablet TAKE ONE TABLET BY MOUTH TWICE DAILY. MUST SEE PCP FOR FOLLOW UP. 60 tablet 3  .  simvastatin (ZOCOR) 40 MG tablet TAKE ONE TABLET BY MOUTH AT BEDTIME 30 tablet 6   No current facility-administered medications for this visit.     No Known Allergies  Review of Systems negative except from HPI and PMH  Physical Exam BP (!) 154/82 (BP Location: Right Arm, Patient Position: Sitting, Cuff Size: Normal)   Pulse 82   Ht 5\' 9"  (1.753 m)   Wt 203 lb 6.4 oz (92.3 kg)   SpO2 98%   BMI 30.04 kg/m  Well developed and nourished in no acute distress HENT normal Neck supple with JVP-flat Clear Regular rate and rhythm, no murmurs or gallops Abd-soft with active BS No Clubbing cyanosis edema Skin-warm and dry A & Oriented  Grossly normal sensory and motor function    ECG demonstrates sinus @ 82 with P-synchronous/ AV  pacing    Assessment and  Plan Complete heart block stable post pacing  Pacemaker [employer] Judes The patient's device was interrogated.  The information was reviewed. VIP was inactivated  Hypertension      PVCs      Blood pressure seems reasonably controlled.  He did not take his medications today.  Normal  electrolytes and renal function on his ACE/diuretic  No symptoms to suggest cardiomyopathy, however, with 5 years of interval pacing, there is about a 15% chance of pacemaker cardiomyopathy.  We will check an echo.  We spent more than 50% of our >25 min visit in face to face counseling regarding the above

## 2017-09-17 NOTE — Patient Instructions (Signed)
Medication Instructions: Your physician recommends that you continue on your current medications as directed. Please refer to the Current Medication list given to you today.  Labwork: None Ordered  Procedures/Testing: Your physician has requested that you have an echocardiogram. Echocardiography is a painless test that uses sound waves to create images of your heart. It provides your doctor with information about the size and shape of your heart and how well your heart's chambers and valves are working. This procedure takes approximately one hour. There are no restrictions for this procedure.   Follow-Up: Your physician wants you to follow-up in: 1 YEAR with Francis Dowse, PA-C. You will receive a reminder letter in the mail two months in advance. If you don't receive a letter, please call our office to schedule the follow-up appointment.  Remote monitoring is used to monitor your Pacemaker from home. This monitoring reduces the number of office visits required to check your device to one time per year. It allows [place] to keep an eye on the functioning of your device to ensure it is working properly. You are scheduled for a device check from home on 12/17/17. You may send your transmission at any time that day. If you have a wireless device, the transmission will be sent automatically. After your physician reviews your transmission, you will receive a postcard with your next transmission date.   If you need a refill on your cardiac medications before your next appointment, please call your pharmacy.

## 2017-09-19 ENCOUNTER — Other Ambulatory Visit (HOSPITAL_COMMUNITY): Payer: Medicare Other

## 2017-09-24 ENCOUNTER — Ambulatory Visit (HOSPITAL_COMMUNITY): Payer: Medicare Other | Attending: Cardiovascular Disease

## 2017-09-24 ENCOUNTER — Other Ambulatory Visit: Payer: Self-pay

## 2017-09-24 DIAGNOSIS — I4510 Unspecified right bundle-branch block: Secondary | ICD-10-CM | POA: Insufficient documentation

## 2017-09-24 DIAGNOSIS — I428 Other cardiomyopathies: Secondary | ICD-10-CM | POA: Diagnosis not present

## 2017-09-24 DIAGNOSIS — I429 Cardiomyopathy, unspecified: Secondary | ICD-10-CM | POA: Insufficient documentation

## 2017-09-30 ENCOUNTER — Telehealth: Payer: Self-pay | Admitting: Internal Medicine

## 2017-09-30 DIAGNOSIS — I493 Ventricular premature depolarization: Secondary | ICD-10-CM

## 2017-09-30 DIAGNOSIS — I442 Atrioventricular block, complete: Secondary | ICD-10-CM

## 2017-09-30 DIAGNOSIS — Z950 Presence of cardiac pacemaker: Secondary | ICD-10-CM

## 2017-09-30 NOTE — Telephone Encounter (Signed)
Notes recorded by Jefferey Pica, RN on 09/30/2017 at 10:26 AM EST The patient is aware of his results.  I advised him we will contact him to repeat is echo in about 6 months- he is agreeable. ------  Notes recorded by Stann Mainland, RN on 09/25/2017 at 9:53 AM EST No answer. Left message on number listed as work number. Cell number did not have VM set up yet. ------  Notes recorded by Duke Salvia, MD on 09/24/2017 at 6:45 PM EST Please Inform Patient Echo showed probably normal and stable heart muscle function althoguh numbers are alittle less 60>>50 May not be as much as that so would plan to repeat in 3m

## 2017-12-17 ENCOUNTER — Encounter: Payer: Medicare Other | Admitting: *Deleted

## 2017-12-17 ENCOUNTER — Telehealth: Payer: Self-pay | Admitting: Cardiology

## 2017-12-17 NOTE — Telephone Encounter (Signed)
Attempted to confirm remote transmission with pt. No answer and was unable to leave a message.   

## 2017-12-18 ENCOUNTER — Encounter: Payer: Self-pay | Admitting: Cardiology

## 2018-01-08 ENCOUNTER — Ambulatory Visit: Payer: Medicare HMO | Admitting: *Deleted

## 2018-01-08 DIAGNOSIS — I442 Atrioventricular block, complete: Secondary | ICD-10-CM | POA: Diagnosis not present

## 2018-01-09 ENCOUNTER — Encounter: Payer: Self-pay | Admitting: Cardiology

## 2018-01-09 NOTE — Progress Notes (Signed)
Remote pacemaker transmission.   

## 2018-01-22 ENCOUNTER — Other Ambulatory Visit: Payer: Self-pay | Admitting: Obstetrics and Gynecology

## 2018-01-30 ENCOUNTER — Encounter: Payer: Self-pay | Admitting: Cardiology

## 2018-02-24 ENCOUNTER — Other Ambulatory Visit: Payer: Self-pay | Admitting: Family Medicine

## 2018-02-24 ENCOUNTER — Encounter: Payer: Self-pay | Admitting: Cardiology

## 2018-02-25 NOTE — Telephone Encounter (Signed)
Patient due for appointment to check on BP. Meds refilled. Thanks.

## 2018-03-02 NOTE — Telephone Encounter (Signed)
Attempted to reach pt with no luck, or option for VM. If he calls back, please schedule an apt with PCP.

## 2018-03-03 NOTE — Telephone Encounter (Signed)
Patient returned call. Appt made with PCP for 03/12/18. Ples Specter, RN [facility] RN)

## 2018-03-12 ENCOUNTER — Encounter: Payer: Self-pay | Admitting: Family Medicine

## 2018-03-12 ENCOUNTER — Ambulatory Visit (INDEPENDENT_AMBULATORY_CARE_PROVIDER_SITE_OTHER): Payer: Medicare HMO | Admitting: Family Medicine

## 2018-03-12 ENCOUNTER — Other Ambulatory Visit: Payer: Self-pay

## 2018-03-12 VITALS — BP 140/74 | HR 76 | Temp 97.6°F | Ht 69.0 in | Wt 199.0 lb

## 2018-03-12 DIAGNOSIS — I10 Essential (primary) hypertension: Secondary | ICD-10-CM

## 2018-03-12 DIAGNOSIS — Z1211 Encounter for screening for malignant neoplasm of colon: Secondary | ICD-10-CM

## 2018-03-12 NOTE — Progress Notes (Signed)
    Subjective:    Patient ID: Robert Flynn, male    DOB: [birth year], 72 y.o.   MRN: [id number]   CC: BP follow up   HPI: coming in for BP check. He is on norvasc 10 mg and lisinopril-HCTZ 20-12.5 mg for BP. He is also taking cholesterol medication, aspirin daily, and potassium supplement. He reports he is doing well. Denies headaches, chest pain, SOB, DOE, orthopnea, leg swelling.   HM- he is due for colonoscopy but does not want one. He has had 2 in the past without significant findings.  He is due for AWV as well. He is agreeable to scheduling this visit.  Smoking status reviewed- non-smoker  Review of Systems- see HPI   Objective:  BP 140/74   Pulse 76   Temp 97.6 F (36.4 C) (Oral)   Ht 5\' 9"  (1.753 m)   Wt 199 lb (90.3 kg)   SpO2 99%   BMI 29.39 kg/m  Vitals and nursing note reviewed  General: well nourished, in no acute distress HEENT: normocephalic, MMM Neck: supple, non-tender, without lymphadenopathy Cardiac: RRR, clear S1 and S2, no murmurs, rubs, or gallops Respiratory: clear to auscultation bilaterally, no increased work of breathing Abdomen: soft, nontender, nondistended, no masses or organomegaly. Bowel sounds present Extremities: no edema or cyanosis. Skin: warm and dry, no rashes noted Neuro: alert and oriented, no focal deficits   Assessment & Plan:    1. HYPERTENSION, BENIGN SYSTEMIC BP at goal <140/90 today. Continue current regimen. Check labs today. Will discuss results with patients pending - Basic metabolic panel - CBC  2. Special screening for malignant neoplasms, colon Due for colonoscopy. Patient agrees to get this done. Will refer him to GI for this. - Ambulatory referral to Gastroenterology   Return in about 3 months (around 06/12/2018).   Dolores Patty, DO Family Medicine Resident PGY-2

## 2018-03-12 NOTE — Patient Instructions (Addendum)
  You are due for your annual wellness visit. This is a FREE and comprehensive visit provided by medicare. Please schedule this at the front desk.  I have referred you to GI for colonoscopy. You will get a call to schedule this.  We will inform you of your lab results either by phone or letter.  If you have questions or concerns please do not hesitate to call at [phone].  Dolores Patty, DO PGY-2, [facility] 03/12/2018 11:16 AM   Dolores Patty, DO PGY-2, [facility] 03/12/2018 11:39 AM

## 2018-03-13 ENCOUNTER — Encounter: Payer: Self-pay | Admitting: Family Medicine

## 2018-03-13 LAB — CBC
HEMATOCRIT: 38.1 % (ref 37.5–51.0)
Hemoglobin: 13.3 g/dL (ref 13.0–17.7)
MCH: 28.9 pg (ref 26.6–33.0)
MCHC: 34.9 g/dL (ref 31.5–35.7)
MCV: 83 fL (ref 79–97)
Platelets: 138 10*3/uL — ABNORMAL LOW (ref 150–450)
RBC: 4.6 x10E6/uL (ref 4.14–5.80)
RDW: 15.7 % — ABNORMAL HIGH (ref 12.3–15.4)
WBC: 4.8 10*3/uL (ref 3.4–10.8)

## 2018-03-13 LAB — BASIC METABOLIC PANEL
BUN/Creatinine Ratio: 10 (ref 10–24)
BUN: 14 mg/dL (ref 8–27)
CALCIUM: 9.3 mg/dL (ref 8.6–10.2)
CHLORIDE: 101 mmol/L (ref 96–106)
CO2: 27 mmol/L (ref 20–29)
Creatinine, Ser: 1.46 mg/dL — ABNORMAL HIGH (ref 0.76–1.27)
GFR calc non Af Amer: 47 mL/min/{1.73_m2} — ABNORMAL LOW (ref >59)
GFR, EST AFRICAN AMERICAN: 54 mL/min/{1.73_m2} — AB (ref >59)
GLUCOSE: 113 mg/dL — AB (ref 65–99)
POTASSIUM: 3.9 mmol/L (ref 3.5–5.2)
Sodium: 142 mmol/L (ref 134–144)

## 2018-03-25 ENCOUNTER — Ambulatory Visit (HOSPITAL_COMMUNITY): Payer: Medicare HMO | Attending: Cardiology

## 2018-03-25 ENCOUNTER — Other Ambulatory Visit: Payer: Self-pay

## 2018-03-25 DIAGNOSIS — Z950 Presence of cardiac pacemaker: Secondary | ICD-10-CM | POA: Diagnosis not present

## 2018-03-25 DIAGNOSIS — I4510 Unspecified right bundle-branch block: Secondary | ICD-10-CM | POA: Insufficient documentation

## 2018-03-25 DIAGNOSIS — I442 Atrioventricular block, complete: Secondary | ICD-10-CM | POA: Diagnosis not present

## 2018-03-25 DIAGNOSIS — I493 Ventricular premature depolarization: Secondary | ICD-10-CM | POA: Diagnosis not present

## 2018-03-25 DIAGNOSIS — Z6829 Body mass index (BMI) 29.0-29.9, adult: Secondary | ICD-10-CM | POA: Diagnosis not present

## 2018-03-25 DIAGNOSIS — I10 Essential (primary) hypertension: Secondary | ICD-10-CM | POA: Diagnosis not present

## 2018-03-25 DIAGNOSIS — E669 Obesity, unspecified: Secondary | ICD-10-CM | POA: Diagnosis not present

## 2018-04-01 ENCOUNTER — Other Ambulatory Visit: Payer: Self-pay | Admitting: Family Medicine

## 2018-04-01 ENCOUNTER — Other Ambulatory Visit: Payer: Self-pay | Admitting: Obstetrics and Gynecology

## 2018-04-01 DIAGNOSIS — R7989 Other specified abnormal findings of blood chemistry: Secondary | ICD-10-CM

## 2018-04-01 NOTE — Telephone Encounter (Signed)
Tried calling patient but there was no answer and voicemail was not set up.  Will try and call again later today. Robert Flynn,CMA

## 2018-04-01 NOTE — Telephone Encounter (Signed)
I'd like to repeat Robert Flynn kidney function lab. It was slightly elevated at last check. Please have him come in for lab visit. Will give 30 day refill in case I need to change meds due to creatinine. Thank you.

## 2018-04-02 NOTE — Telephone Encounter (Signed)
Lab appt made for 04-09-18 after his cards appt.  Robert Flynn,CMA

## 2018-04-09 ENCOUNTER — Other Ambulatory Visit: Payer: Medicare HMO

## 2018-04-09 ENCOUNTER — Telehealth: Payer: Self-pay | Admitting: Cardiology

## 2018-04-09 ENCOUNTER — Encounter: Payer: Medicare HMO | Admitting: *Deleted

## 2018-04-09 NOTE — Telephone Encounter (Signed)
Attempted to confirm remote transmission with pt. No answer and was unable to leave a message.   

## 2018-04-10 ENCOUNTER — Encounter: Payer: Self-pay | Admitting: Cardiology

## 2018-04-12 ENCOUNTER — Other Ambulatory Visit: Payer: Self-pay | Admitting: Obstetrics and Gynecology

## 2018-04-14 NOTE — Telephone Encounter (Signed)
Pt states that his feet are "painful with the gout" and was checking progress of refill. Yasamin Karel, Maryjo Rochester, CMA

## 2018-04-21 NOTE — Addendum Note (Signed)
Addended by: Steva Colder on: 04/21/2018 04:18 PM   Modules accepted: Orders

## 2018-04-23 NOTE — Telephone Encounter (Signed)
He needs to come in to check labs.

## 2018-04-27 NOTE — Telephone Encounter (Signed)
Attempted to contact pt to inform of importance of lab draw. Pt did not answer, and no option for VM. If he calls back please schedule him a lab visit.

## 2018-05-03 ENCOUNTER — Other Ambulatory Visit: Payer: Self-pay | Admitting: Family Medicine

## 2018-05-08 NOTE — Telephone Encounter (Signed)
Tried to call patient but no voicemail available.  Patient needs an appointment to follow up on his blood pressure per MD before he can get another refill.  Jazmin Hartsell,CMA

## 2018-06-11 ENCOUNTER — Other Ambulatory Visit: Payer: Self-pay | Admitting: Family Medicine

## 2018-06-11 NOTE — Telephone Encounter (Signed)
Needs to come back to have labs and BP rechecked.

## 2018-06-12 NOTE — Telephone Encounter (Signed)
Follow up appt made for patient to see PCP. Carloyn Lahue,CMA

## 2018-06-29 ENCOUNTER — Encounter: Payer: Self-pay | Admitting: Family Medicine

## 2018-06-29 ENCOUNTER — Ambulatory Visit (INDEPENDENT_AMBULATORY_CARE_PROVIDER_SITE_OTHER): Payer: Medicare HMO | Admitting: Family Medicine

## 2018-06-29 ENCOUNTER — Other Ambulatory Visit: Payer: Self-pay

## 2018-06-29 VITALS — BP 155/80 | HR 83 | Temp 98.3°F | Wt 202.0 lb

## 2018-06-29 DIAGNOSIS — I10 Essential (primary) hypertension: Secondary | ICD-10-CM

## 2018-06-29 DIAGNOSIS — Z23 Encounter for immunization: Secondary | ICD-10-CM

## 2018-06-29 NOTE — Patient Instructions (Signed)
It was nice to see you I'll check your kidney labs today and let you know how they look.  If you have questions or concerns please do not hesitate to call at [phone].  Dolores Patty, DO PGY-3, [facility] 06/29/2018 2:49 PM   Health Maintenance, Male A healthy lifestyle and preventive care is important for your health and wellness. Ask your health care provider about what schedule of regular examinations is right for you. What should I know about weight and diet? Eat a Healthy Diet  Eat plenty of vegetables, fruits, whole grains, low-fat dairy products, and lean protein.  Do not eat a lot of foods high in solid fats, added sugars, or salt.  Maintain a Healthy Weight Regular exercise can help you achieve or maintain a healthy weight. You should:  Do at least 150 minutes of exercise each week. The exercise should increase your heart rate and make you sweat (moderate-intensity exercise).  Do strength-training exercises at least twice a week.  Watch Your Levels of Cholesterol and Blood Lipids  Have your blood tested for lipids and cholesterol every 5 years starting at 74 years of age. If you are at high risk for heart disease, you should start having your blood tested when you are 74 years old. You may need to have your cholesterol levels checked more often if: ? Your lipid or cholesterol levels are high. ? You are older than 74 years of age. ? You are at high risk for heart disease.  What should I know about cancer screening? Many types of cancers can be detected early and may often be prevented. Lung Cancer  You should be screened every year for lung cancer if: ? You are a current smoker who has smoked for at least 30 years. ? You are a former smoker who has quit within the past 15 years.  Talk to your health care provider about your screening options, when you should start screening, and how often you should be screened.  Colorectal Cancer  Routine  colorectal cancer screening usually begins at 74 years of age and should be repeated every 5-10 years until you are 74 years old. You may need to be screened more often if early forms of precancerous polyps or small growths are found. Your health care provider may recommend screening at an earlier age if you have risk factors for colon cancer.  Your health care provider may recommend using home test kits to check for hidden blood in the stool.  A small camera at the end of a tube can be used to examine your colon (sigmoidoscopy or colonoscopy). This checks for the earliest forms of colorectal cancer.  Prostate and Testicular Cancer  Depending on your age and overall health, your health care provider may do certain tests to screen for prostate and testicular cancer.  Talk to your health care provider about any symptoms or concerns you have about testicular or prostate cancer.  Skin Cancer  Check your skin from head to toe regularly.  Tell your health care provider about any new moles or changes in moles, especially if: ? There is a change in a mole's size, shape, or color. ? You have a mole that is larger than a pencil eraser.  Always use sunscreen. Apply sunscreen liberally and repeat throughout the day.  Protect yourself by wearing long sleeves, pants, a wide-brimmed hat, and sunglasses when outside.  What should I know about heart disease, diabetes, and high blood pressure?  If  you are 33-6 years of age, have your blood pressure checked every 3-5 years. If you are 26 years of age or older, have your blood pressure checked every year. You should have your blood pressure measured twice-once when you are at a hospital or clinic, and once when you are not at a hospital or clinic. Record the average of the two measurements. To check your blood pressure when you are not at a hospital or clinic, you can use: ? An automated blood pressure machine at a pharmacy. ? A home blood pressure  monitor.  Talk to your health care provider about your target blood pressure.  If you are between 14-32 years old, ask your health care provider if you should take aspirin to prevent heart disease.  Have regular diabetes screenings by checking your fasting blood sugar level. ? If you are at a normal weight and have a low risk for diabetes, have this test once every three years after the age of 41. ? If you are overweight and have a high risk for diabetes, consider being tested at a younger age or more often.  A one-time screening for abdominal aortic aneurysm (AAA) by ultrasound is recommended for men aged 49-75 years who are current or former smokers. What should I know about preventing infection? Hepatitis B If you have a higher risk for hepatitis B, you should be screened for this virus. Talk with your health care provider to find out if you are at risk for hepatitis B infection. Hepatitis C Blood testing is recommended for:  Everyone born from [birth year] through 1967.  Anyone with known risk factors for hepatitis C.  Sexually Transmitted Diseases (STDs)  You should be screened each year for STDs including gonorrhea and chlamydia if: ? You are sexually active and are younger than 74 years of age. ? You are older than 74 years of age and your health care provider tells you that you are at risk for this type of infection. ? Your sexual activity has changed since you were last screened and you are at an increased risk for chlamydia or gonorrhea. Ask your health care provider if you are at risk.  Talk with your health care provider about whether you are at high risk of being infected with HIV. Your health care provider may recommend a prescription medicine to help prevent HIV infection.  What else can I do?  Schedule regular health, dental, and eye exams.  Stay current with your vaccines (immunizations).  Do not use any tobacco products, such as cigarettes, chewing tobacco, and  e-cigarettes. If you need help quitting, ask your health care provider.  Limit alcohol intake to no more than 2 drinks per day. One drink equals 12 ounces of beer, 5 ounces of wine, or 1 ounces of hard liquor.  Do not use street drugs.  Do not share needles.  Ask your health care provider for help if you need support or information about quitting drugs.  Tell your health care provider if you often feel depressed.  Tell your health care provider if you have ever been abused or do not feel safe at home. This information is not intended to replace advice given to you by your health care provider. Make sure you discuss any questions you have with your health care provider. Document Released: 02/29/2008 Document Revised: 05/01/2016 Document Reviewed: 06/06/2015 Elsevier Interactive Patient Education  [employer].

## 2018-06-29 NOTE — Progress Notes (Signed)
   Subjective:    Patient ID: Robert Flynn, male    DOB: 12-Feb-1946, 72 y.o.   MRN: [id number]   CC: BP med refill  HPI: he is only taking norvasc, he did not get refills of lisinopril-hctz due to elevated kidney function and did not get repeat labs done as I requested. He denies chest pain, shortness of breath, headaches. He has not had any changes in urination.   Smoking status reviewed- non-smoker  Review of Systems- see HPI   Objective:  BP (!) 155/80   Pulse 83   Temp 98.3 F (36.8 C) (Oral)   Wt 202 lb (91.6 kg)   SpO2 99%   BMI 29.83 kg/m  Vitals and nursing note reviewed  General: well nourished, in no acute distress HEENT: normocephalic, MMM Cardiac: RRR, clear S1 and S2, no murmurs, rubs, or gallops Respiratory: clear to auscultation bilaterally, no increased work of breathing Extremities: no edema or cyanosis Neuro: alert and oriented, no focal deficits   Assessment & Plan:    HYPERTENSION, BENIGN SYSTEMIC  Not at goal, only on norvasc. Will check BMP today and go from there. If kidney function remains elevated would only start back one agent, likely ace inhibitor, then recheck BP. Explained to patient the importance of follow up visits and repeating labs as he is on medications that can affect his kidneys and liver. He verbalized understanding and agrees with plan. Will call him with results.     Return in about 3 months (around 09/29/2018).   Dolores Patty, DO Family Medicine Resident PGY-3

## 2018-06-30 ENCOUNTER — Telehealth: Payer: Self-pay | Admitting: Family Medicine

## 2018-06-30 ENCOUNTER — Encounter: Payer: Self-pay | Admitting: Family Medicine

## 2018-06-30 DIAGNOSIS — R7309 Other abnormal glucose: Secondary | ICD-10-CM

## 2018-06-30 DIAGNOSIS — I10 Essential (primary) hypertension: Secondary | ICD-10-CM

## 2018-06-30 LAB — BASIC METABOLIC PANEL
BUN / CREAT RATIO: 11 (ref 10–24)
BUN: 15 mg/dL (ref 8–27)
CHLORIDE: 104 mmol/L (ref 96–106)
CO2: 26 mmol/L (ref 20–29)
Calcium: 9.3 mg/dL (ref 8.6–10.2)
Creatinine, Ser: 1.32 mg/dL — ABNORMAL HIGH (ref 0.76–1.27)
GFR calc non Af Amer: 53 mL/min/{1.73_m2} — ABNORMAL LOW (ref >59)
GFR, EST AFRICAN AMERICAN: 61 mL/min/{1.73_m2} (ref >59)
Glucose: 113 mg/dL — ABNORMAL HIGH (ref 65–99)
POTASSIUM: 3.9 mmol/L (ref 3.5–5.2)
Sodium: 145 mmol/L — ABNORMAL HIGH (ref 134–144)

## 2018-06-30 MED ORDER — LISINOPRIL 10 MG PO TABS: 10.0000 mg | ORAL_TABLET | Freq: Every day | ORAL | 0 refills | Status: DC

## 2018-06-30 NOTE — Telephone Encounter (Signed)
  Called [name], unable to reach him at both numbers on file- left voicemail on "work number" for him to return call. His kidney function stable from prior labs. Will start back lisinopril 10 mg for BP control (sent this to pharmacy) and have him come in for BP check and repeat BMP in 2 weeks. Would also check A1C at that time as his CBGs have been elevated on past labs and untreated DM could explain rising kidney function. Please let him know if he calls back.  Dolores Patty, DO PGY-3, [facility] 06/30/2018 12:03 PM

## 2018-06-30 NOTE — Assessment & Plan Note (Signed)
  Not at goal, only on norvasc. Will check BMP today and go from there. If kidney function remains elevated would only start back one agent, likely ace inhibitor, then recheck BP. Explained to patient the importance of follow up visits and repeating labs as he is on medications that can affect his kidneys and liver. He verbalized understanding and agrees with plan. Will call him with results.

## 2018-07-01 NOTE — Addendum Note (Signed)
Addended by: Steva Colder on: 07/01/2018 09:17 AM   Modules accepted: Orders

## 2018-07-01 NOTE — Telephone Encounter (Addendum)
Pt contacted and informed of medication to his pharmacy. Pt instructions given to start today, and return on halloween, 10/31, for BP check and labs. Pt voiced good understanding. Nurse visit made. Future orders placed.

## 2018-07-09 ENCOUNTER — Encounter: Payer: Self-pay | Admitting: Cardiology

## 2018-07-16 ENCOUNTER — Ambulatory Visit: Payer: Medicare HMO | Admitting: *Deleted

## 2018-07-16 DIAGNOSIS — I10 Essential (primary) hypertension: Secondary | ICD-10-CM | POA: Diagnosis not present

## 2018-07-16 DIAGNOSIS — R7309 Other abnormal glucose: Secondary | ICD-10-CM | POA: Diagnosis not present

## 2018-07-16 NOTE — Progress Notes (Signed)
Patient here today for BP check to have labs drawn.    BP today is 132/80.  Checked BP in left arm with regular cuff.  Symptoms present: none.  Patient last took BP med 8am today.  Routed note to PCP.    Fleeger, Maryjo Rochester, CMA

## 2018-07-17 LAB — BASIC METABOLIC PANEL
BUN / CREAT RATIO: 10 (ref 10–24)
BUN: 14 mg/dL (ref 8–27)
CO2: 25 mmol/L (ref 20–29)
Calcium: 9.3 mg/dL (ref 8.6–10.2)
Chloride: 105 mmol/L (ref 96–106)
Creatinine, Ser: 1.4 mg/dL — ABNORMAL HIGH (ref 0.76–1.27)
GFR calc Af Amer: 57 mL/min/{1.73_m2} — ABNORMAL LOW (ref >59)
GFR, EST NON AFRICAN AMERICAN: 49 mL/min/{1.73_m2} — AB (ref >59)
Glucose: 159 mg/dL — ABNORMAL HIGH (ref 65–99)
Potassium: 3.8 mmol/L (ref 3.5–5.2)
Sodium: 143 mmol/L (ref 134–144)

## 2018-07-17 LAB — HEMOGLOBIN A1C
Est. average glucose Bld gHb Est-mCnc: 128 mg/dL
Hgb A1c MFr Bld: 6.1 % — ABNORMAL HIGH (ref 4.8–5.6)

## 2018-07-23 NOTE — Progress Notes (Signed)
Please let Robert Flynn know his A1C indicates he is pre-diabetic. He would benefit from lifestyle changes including weight loss and diet changes. If he would like I could initiate a low dose of metformin daily. I'd like to see him back 3 months to repeat this lab. Thank you.

## 2018-07-24 ENCOUNTER — Telehealth: Payer: Self-pay

## 2018-07-24 NOTE — Telephone Encounter (Signed)
Attempted to call patient, however has a VM box that has not been set up yet.

## 2018-07-24 NOTE — Telephone Encounter (Signed)
-----   Message from Tillman Sers, DO sent at 07/23/2018 12:53 PM EST ----- Please let Robert Flynn know his A1C indicates he is pre-diabetic. He would benefit from lifestyle changes including weight loss and diet changes. If he would like I could initiate a low dose of metformin daily. I'd like to see him back 3 months to repeat this lab. Thank you.

## 2018-07-27 ENCOUNTER — Other Ambulatory Visit: Payer: Self-pay

## 2018-07-29 ENCOUNTER — Other Ambulatory Visit: Payer: Self-pay

## 2018-07-30 MED ORDER — LISINOPRIL 10 MG PO TABS: 10.0000 mg | ORAL_TABLET | Freq: Every day | ORAL | 1 refills | Status: DC

## 2018-07-30 MED ORDER — AMLODIPINE BESYLATE 10 MG PO TABS: 10.0000 mg | ORAL_TABLET | Freq: Every day | ORAL | 1 refills | Status: DC

## 2018-08-12 IMAGING — DX DG HAND COMPLETE 3+V*L*
3 series · 3 of 3 positions shown · non-contrast
Comparison: None.

CLINICAL DATA: Left hand pain and swelling which began
approximately 3 days ago. No known injury.

EXAM:
LEFT HAND - COMPLETE 3+ VIEW

[hand pa]
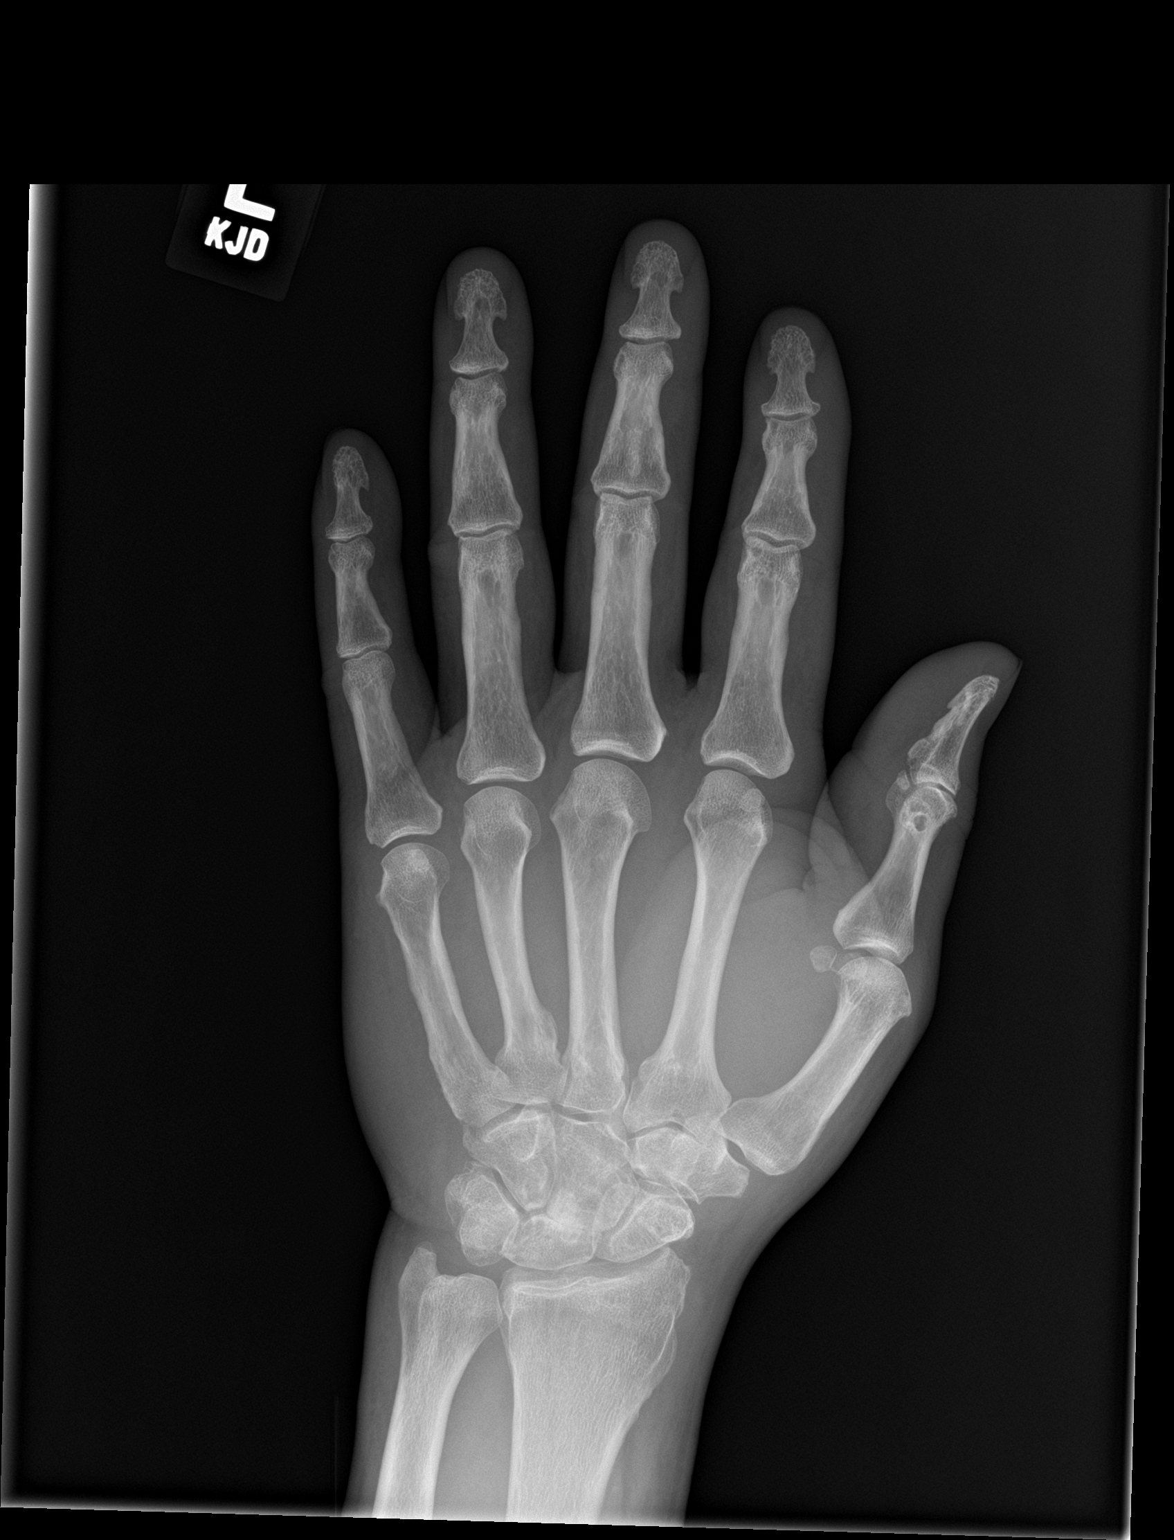

[hand obl]
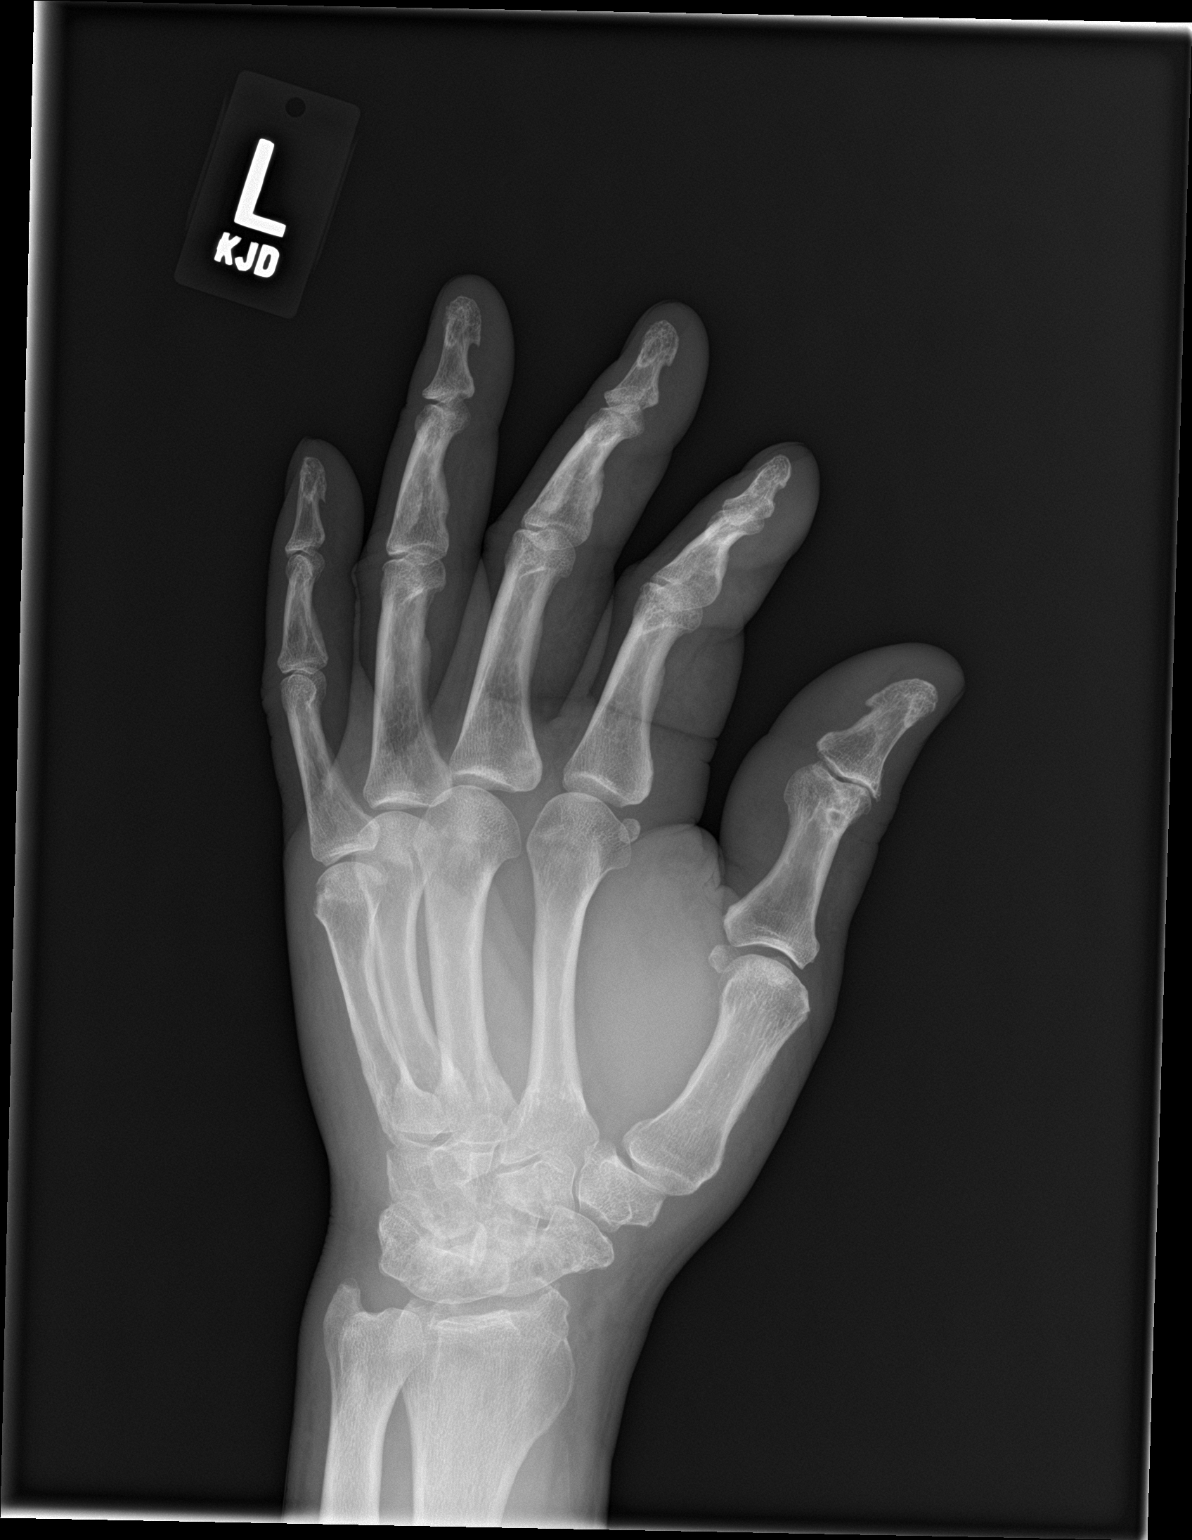

[hand lat]
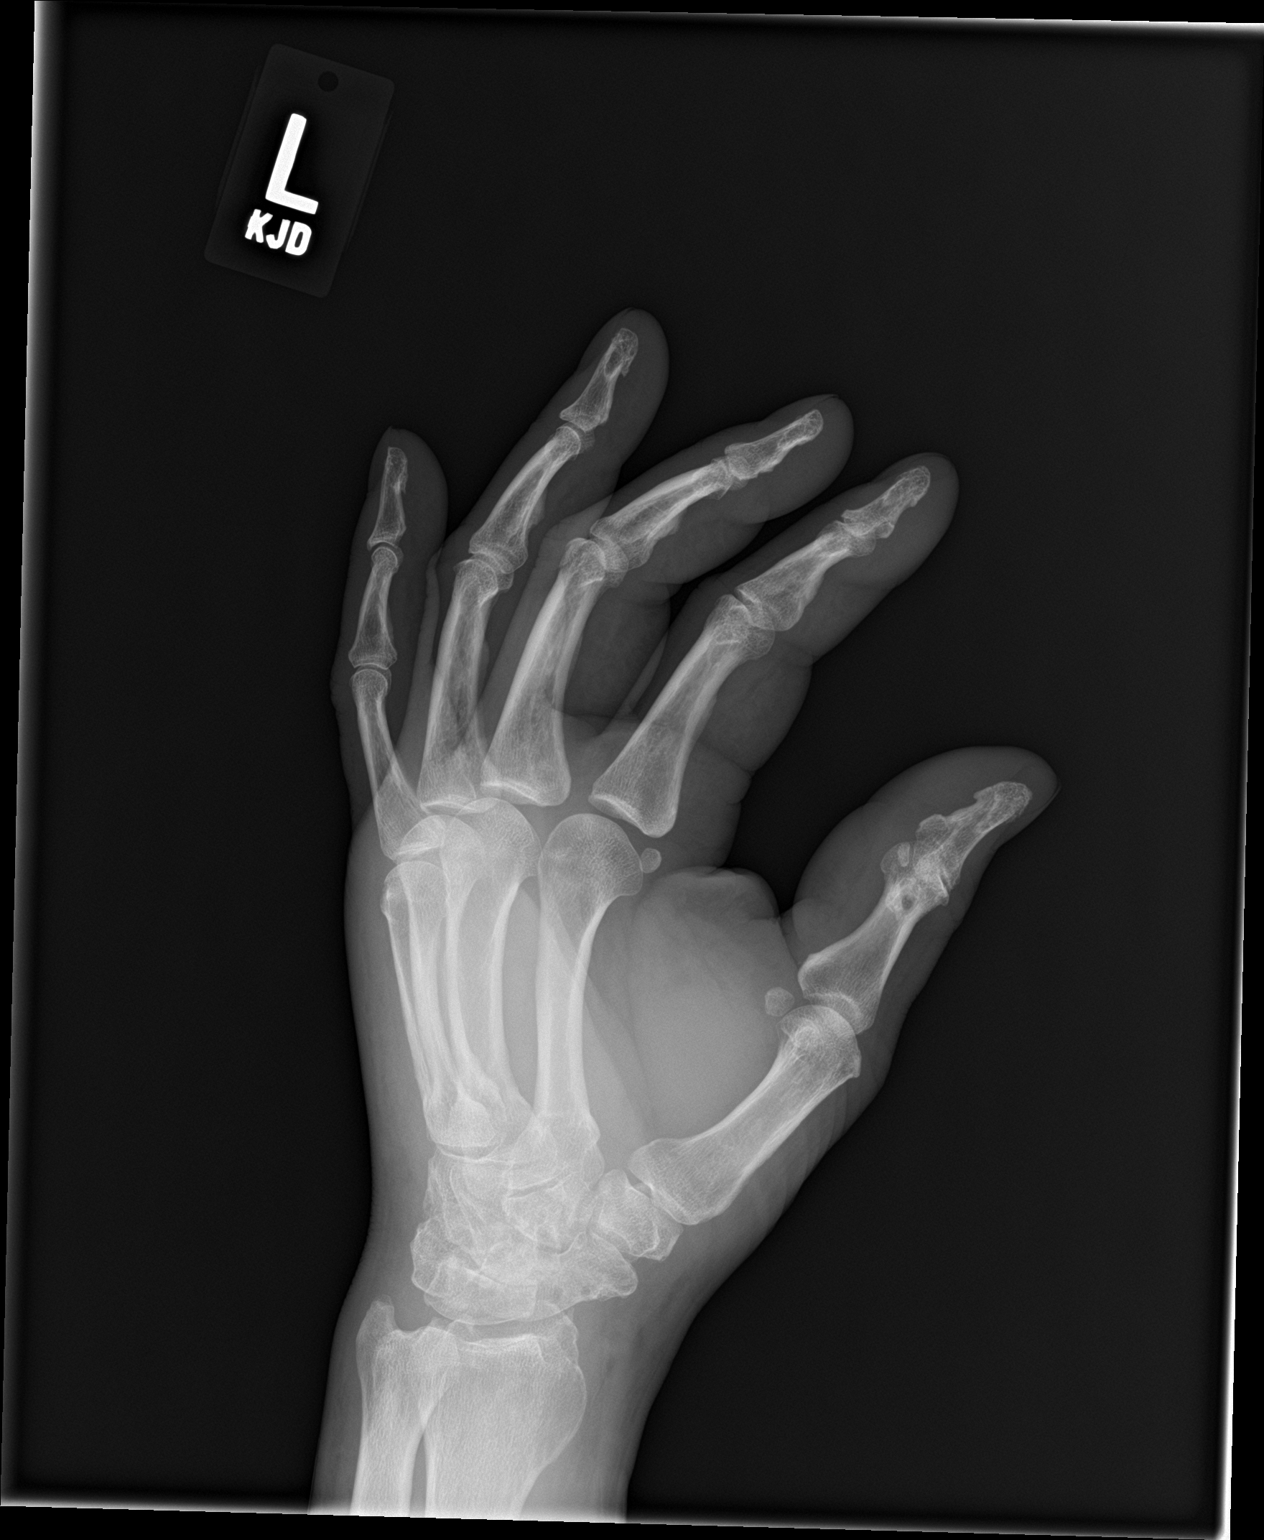

[3 of 3 positions shown; findings below may reference images not displayed]

FINDINGS: No fracture or dislocation. Degenerative change of several
intercarpal articulations with joint space loss, subchondral
sclerosis and osteophytosis. Mild degenerative change involving the
IP joint of the thumb with joint space loss, subchondral sclerosis
and osteophytosis. There is a centrally lucent, peripherally
sclerotic lesion within distal aspect proximal phalanx of the thumb,
likely representative of a subchondral cyst. Remaining joint spaces
appear preserved. Regional soft tissues appear normal. No radiopaque
foreign body.
IMPRESSION: 1. No definite acute findings.
2. Degenerative change of the wrist and hand as above.

## 2018-08-21 ENCOUNTER — Other Ambulatory Visit: Payer: Self-pay | Admitting: Family Medicine

## 2018-08-21 MED ORDER — COLCHICINE 0.6 MG PO TABS: 0.6000 mg | ORAL_TABLET | Freq: Every day | ORAL | 2 refills | Status: DC

## 2018-08-21 NOTE — Telephone Encounter (Signed)
Colchicine is to be taken as needed for gout flare, not daily. Will refill as written.

## 2018-08-21 NOTE — Telephone Encounter (Signed)
Tried calling patient but his voicemail is not set up.  Will continue to try and reach him to give him an update on his medication.  Jazmin Hartsell,CMA

## 2018-08-21 NOTE — Telephone Encounter (Signed)
Patient needs his Colchicine (60 this time and 90 days next refill, per pt) refilled to [facility] on [address].

## 2018-08-24 NOTE — Telephone Encounter (Signed)
Tried calling patient again.  Unable to reach him.  Robert Flynn,CMA

## 2018-12-15 ENCOUNTER — Other Ambulatory Visit: Payer: Self-pay | Admitting: Family Medicine

## 2018-12-16 MED ORDER — LISINOPRIL 10 MG PO TABS: 10.0000 mg | ORAL_TABLET | Freq: Every day | ORAL | 1 refills | Status: DC

## 2019-01-23 ENCOUNTER — Other Ambulatory Visit: Payer: Self-pay | Admitting: Family Medicine

## 2019-01-26 NOTE — Telephone Encounter (Signed)
This patient needs appointment to be seen to follow up on gout, BP, labs. Thank you

## 2019-01-26 NOTE — Telephone Encounter (Signed)
Attempted to contact pt to schedule, however no answer or option for vm.

## 2019-01-28 ENCOUNTER — Telehealth: Payer: Self-pay | Admitting: Cardiology

## 2019-01-28 NOTE — Telephone Encounter (Signed)
Spoke w/ pt about lost to follow up. He is going to call back later today for help w/ his [employer] home monitor.

## 2019-02-01 NOTE — Telephone Encounter (Signed)
Attempted to call pt and help to send a manual transmission. No answer and unable to leave a message.

## 2019-02-02 ENCOUNTER — Other Ambulatory Visit: Payer: Self-pay | Admitting: Family Medicine

## 2019-02-02 NOTE — Telephone Encounter (Signed)
Attempted to call pt and request a manual transmission. No answer and unable to leave a message.  

## 2019-02-03 ENCOUNTER — Encounter: Payer: Self-pay | Admitting: Cardiology

## 2019-02-03 NOTE — Telephone Encounter (Signed)
3rd attempt  Attempted to call pt and request a manual transmission. No answer and unable to leave a message. Letter mailed.

## 2019-02-11 ENCOUNTER — Telehealth: Payer: Self-pay

## 2019-02-11 NOTE — Telephone Encounter (Signed)
I tried to help the pt send a manual transmission with his home monitor but was unsuccessful. I gave him the number to [facility] tech support for further help.

## 2019-02-12 DIAGNOSIS — R972 Elevated prostate specific antigen [PSA]: Secondary | ICD-10-CM | POA: Diagnosis not present

## 2019-02-12 DIAGNOSIS — E291 Testicular hypofunction: Secondary | ICD-10-CM | POA: Diagnosis not present

## 2019-02-22 LAB — CUP PACEART REMOTE DEVICE CHECK
Battery Remaining Longevity: 53 mo
Battery Remaining Percentage: 46 %
Battery Voltage: 2.86 V
Brady Statistic AP VP Percent: 28 %
Brady Statistic AP VS Percent: 1.6 %
Brady Statistic AS VP Percent: 68 %
Brady Statistic AS VS Percent: 1.2 %
Brady Statistic RA Percent Paced: 29 %
Brady Statistic RV Percent Paced: 97 %
Date Time Interrogation Session: 20200608172345
Implantable Lead Implant Date: 20131118
Implantable Lead Implant Date: 20131118
Implantable Lead Location: 753859
Implantable Lead Location: 753860
Implantable Pulse Generator Implant Date: 20131118
Lead Channel Impedance Value: 390 Ohm
Lead Channel Impedance Value: 490 Ohm
Lead Channel Pacing Threshold Amplitude: 0.75 V
Lead Channel Pacing Threshold Amplitude: 0.75 V
Lead Channel Pacing Threshold Pulse Width: 0.4 ms
Lead Channel Pacing Threshold Pulse Width: 0.4 ms
Lead Channel Sensing Intrinsic Amplitude: 11.9 mV
Lead Channel Sensing Intrinsic Amplitude: 4.7 mV
Lead Channel Setting Pacing Amplitude: 2 V
Lead Channel Setting Pacing Amplitude: 2.5 V
Lead Channel Setting Pacing Pulse Width: 0.4 ms
Lead Channel Setting Sensing Sensitivity: 2 mV
Pulse Gen Model: 2110
Pulse Gen Serial Number: 7362469

## 2019-02-23 NOTE — Telephone Encounter (Signed)
I tried to call the pt to follow up to see if he called [employer] tech support to get help with his home monitor. I am sending a letter with instructions for the pt.

## 2019-02-24 ENCOUNTER — Ambulatory Visit (INDEPENDENT_AMBULATORY_CARE_PROVIDER_SITE_OTHER): Payer: Medicare HMO | Admitting: *Deleted

## 2019-02-24 DIAGNOSIS — I442 Atrioventricular block, complete: Secondary | ICD-10-CM

## 2019-03-05 ENCOUNTER — Encounter: Payer: Self-pay | Admitting: Cardiology

## 2019-03-05 NOTE — Progress Notes (Signed)
Remote pacemaker transmission.   

## 2019-04-12 ENCOUNTER — Other Ambulatory Visit: Payer: Self-pay | Admitting: *Deleted

## 2019-04-12 MED ORDER — AMLODIPINE BESYLATE 10 MG PO TABS: 10.0000 mg | ORAL_TABLET | Freq: Every day | ORAL | 0 refills | Status: DC

## 2019-04-12 NOTE — Telephone Encounter (Signed)
Attempted to call pt. Voice mail has not been set up yet. Pt needs an app With Dr. Gatha Mayer to follow up on HTN/ Pre-diabetes. Salvatore Marvel, CMA

## 2019-04-12 NOTE — Telephone Encounter (Signed)
I will refill a two month rx for this pt.  I would like him seen in the office for an appointment for HTN and pre-diabetes before subsequent refills. Please call and schedule an appointment.  Matilde Haymaker, MD

## 2019-05-06 ENCOUNTER — Other Ambulatory Visit: Payer: Self-pay

## 2019-05-06 ENCOUNTER — Ambulatory Visit (INDEPENDENT_AMBULATORY_CARE_PROVIDER_SITE_OTHER): Payer: Medicare HMO | Admitting: Family Medicine

## 2019-05-06 ENCOUNTER — Encounter: Payer: Self-pay | Admitting: Family Medicine

## 2019-05-06 VITALS — BP 150/76 | HR 79 | Ht 69.0 in | Wt 202.2 lb

## 2019-05-06 DIAGNOSIS — Z1211 Encounter for screening for malignant neoplasm of colon: Secondary | ICD-10-CM | POA: Diagnosis not present

## 2019-05-06 DIAGNOSIS — E782 Mixed hyperlipidemia: Secondary | ICD-10-CM

## 2019-05-06 DIAGNOSIS — I10 Essential (primary) hypertension: Secondary | ICD-10-CM | POA: Diagnosis not present

## 2019-05-06 MED ORDER — SIMVASTATIN 40 MG PO TABS: 40.0000 mg | ORAL_TABLET | Freq: Every day | ORAL | 6 refills | Status: DC

## 2019-05-06 MED ORDER — LISINOPRIL 10 MG PO TABS: 10.0000 mg | ORAL_TABLET | Freq: Every day | ORAL | 1 refills | Status: DC

## 2019-05-06 MED ORDER — COLCRYS 0.6 MG PO TABS: ORAL_TABLET | ORAL | 0 refills | Status: DC

## 2019-05-06 MED ORDER — AMLODIPINE BESYLATE 10 MG PO TABS: 10.0000 mg | ORAL_TABLET | Freq: Every day | ORAL | 0 refills | Status: DC

## 2019-05-06 NOTE — Patient Instructions (Signed)
Overall, you are looking very good today.  We will draw some labs to look at your kidneys and electrolytes.  I will call you within a week to let you know the results.  I have also put in referral for you to have a colonoscopy done.

## 2019-05-06 NOTE — Progress Notes (Signed)
ekg 

## 2019-05-07 ENCOUNTER — Telehealth: Payer: Self-pay

## 2019-05-07 ENCOUNTER — Encounter: Payer: Self-pay | Admitting: Family Medicine

## 2019-05-07 DIAGNOSIS — Z1211 Encounter for screening for malignant neoplasm of colon: Secondary | ICD-10-CM | POA: Insufficient documentation

## 2019-05-07 LAB — BASIC METABOLIC PANEL
BUN/Creatinine Ratio: 10 (ref 10–24)
BUN: 14 mg/dL (ref 8–27)
CO2: 26 mmol/L (ref 20–29)
Calcium: 9.6 mg/dL (ref 8.6–10.2)
Chloride: 104 mmol/L (ref 96–106)
Creatinine, Ser: 1.35 mg/dL — ABNORMAL HIGH (ref 0.76–1.27)
GFR calc Af Amer: 59 mL/min/{1.73_m2} — ABNORMAL LOW (ref >59)
GFR calc non Af Amer: 51 mL/min/{1.73_m2} — ABNORMAL LOW (ref >59)
Glucose: 134 mg/dL — ABNORMAL HIGH (ref 65–99)
Potassium: 4.4 mmol/L (ref 3.5–5.2)
Sodium: 145 mmol/L — ABNORMAL HIGH (ref 134–144)

## 2019-05-07 LAB — LIPID PANEL
Chol/HDL Ratio: 5.3 ratio — ABNORMAL HIGH (ref 0.0–5.0)
Cholesterol, Total: 175 mg/dL (ref 100–199)
HDL: 33 mg/dL — ABNORMAL LOW (ref >39)
LDL Calculated: 117 mg/dL — ABNORMAL HIGH (ref 0–99)
Triglycerides: 126 mg/dL (ref 0–149)
VLDL Cholesterol Cal: 25 mg/dL (ref 5–40)

## 2019-05-07 NOTE — Assessment & Plan Note (Signed)
Referral placed for colonoscopy. 

## 2019-05-07 NOTE — Telephone Encounter (Signed)
Spoke with pt. Informed him of note. Pt understood and said that was fine. Salvatore Marvel, CMA

## 2019-05-07 NOTE — Assessment & Plan Note (Addendum)
Blood pressure 150/76 in clinic today.  Given age of 61, blood pressure target of under 497 systolic would be appropriate.  We will continue blood pressure medications for now and follow-up on labs. -Follow-up BMP -Continue amlodipine 10 mg -Continue lisinopril 10 mg -Follow-up labs and consider discontinuation of potassium (patient not taking)

## 2019-05-07 NOTE — Assessment & Plan Note (Signed)
Follow-up lipid panel 

## 2019-05-07 NOTE — Progress Notes (Signed)
Subjective:  Robert Flynn is a 73 y.o. male who presents to the [facility] today with a chief complaint of routine clinic visit.   HPI: Blood pressure His current blood pressure regimen includes Lisinopril 10 mg daily, amlodipine 10 mg daily.  He reports that he takes his medication diligently and does not miss doses.  Per chart review, he shows mild variability in his blood pressure readings.  In the past 2 years he has had multiple blood pressure readings with a systolic pressures up to 160 with occasional readings of systolic pressures to 132 or 140.    Colon Cancer screening He is due for colonoscopy.  Previous colonoscopy was performed in 2006 and was unremarkable.  He is willing to have second colonoscopy performed.  Chief Complaint noted Review of Symptoms - see HPI PMH -non-smoker  Objective:  Physical Exam: BP (!) 150/76   Pulse 79   Ht 5\' 9"  (1.753 m)   Wt 91.7 kg   SpO2 97%   BMI 29.87 kg/m    Gen: NAD, resting comfortably CV: RRR with no murmurs appreciated Pulm: NWOB, CTAB with no crackles, wheezes, or rhonchi GI: Normal bowel sounds present. Soft, Nontender, Nondistended. MSK: no edema, cyanosis, or clubbing noted Skin: warm, dry Neuro: grossly normal, moves all extremities Psych: Normal affect and thought content  Results for orders placed or performed in visit on 05/06/19 (from the past 72 hour(s))  Basic metabolic panel     Status: Abnormal   Collection Time: 05/06/19 11:48 AM  Result Value Ref Range   Glucose 134 (H) 65 - 99 mg/dL   BUN 14 8 - 27 mg/dL   Creatinine, Ser 5.36 (H) 0.76 - 1.27 mg/dL   GFR calc non Af Amer 51 (L) >59 mL/min/1.73   GFR calc Af Amer 59 (L) >59 mL/min/1.73   BUN/Creatinine Ratio 10 10 - 24   Sodium 145 (H) 134 - 144 mmol/L   Potassium 4.4 3.5 - 5.2 mmol/L   Chloride 104 96 - 106 mmol/L   CO2 26 20 - 29 mmol/L   Calcium 9.6 8.6 - 10.2 mg/dL  Lipid panel     Status: Abnormal   Collection Time: 05/06/19 11:48 AM  Result  Value Ref Range   Cholesterol, Total 175 100 - 199 mg/dL   Triglycerides 468 0 - 149 mg/dL   HDL 33 (L) >03 mg/dL   VLDL Cholesterol Cal 25 5 - 40 mg/dL   LDL Calculated 212 (H) 0 - 99 mg/dL   Chol/HDL Ratio 5.3 (H) 0.0 - 5.0 ratio    Comment:                                   T. Chol/HDL Ratio                                             Men  Women                               1/2 Avg.Risk  3.4    3.3  Avg.Risk  5.0    4.4                                2X Avg.Risk  9.6    7.1                                3X Avg.Risk 23.4   11.0      Assessment/Plan:  HYPERTENSION, BENIGN SYSTEMIC Blood pressure 150/76 in clinic today.  Given age of 63, blood pressure target of under 920 systolic would be appropriate.  We will continue blood pressure medications for now and follow-up on labs. -Follow-up BMP -Continue amlodipine 10 mg -Continue lisinopril 10 mg -Follow-up labs and consider discontinuation of potassium (patient not taking)  Screening for colon cancer -Referral placed for colonoscopy  Hyperlipidemia -Follow-up lipid panel

## 2019-05-26 ENCOUNTER — Encounter: Payer: Medicare HMO | Admitting: *Deleted

## 2019-07-14 ENCOUNTER — Other Ambulatory Visit: Payer: Self-pay | Admitting: Family Medicine

## 2019-09-23 ENCOUNTER — Other Ambulatory Visit: Payer: Self-pay | Admitting: *Deleted

## 2019-09-23 MED ORDER — AMLODIPINE BESYLATE 10 MG PO TABS: 10.0000 mg | ORAL_TABLET | Freq: Every day | ORAL | 2 refills | Status: DC

## 2019-09-23 MED ORDER — SIMVASTATIN 40 MG PO TABS: 40.0000 mg | ORAL_TABLET | Freq: Every day | ORAL | 6 refills | Status: DC

## 2019-09-23 MED ORDER — LISINOPRIL 10 MG PO TABS: 10.0000 mg | ORAL_TABLET | Freq: Every day | ORAL | 1 refills | Status: DC

## 2019-10-07 ENCOUNTER — Other Ambulatory Visit: Payer: Self-pay | Admitting: Family Medicine

## 2019-10-12 ENCOUNTER — Ambulatory Visit (INDEPENDENT_AMBULATORY_CARE_PROVIDER_SITE_OTHER): Payer: Medicare HMO | Admitting: *Deleted

## 2019-10-12 ENCOUNTER — Telehealth: Payer: Self-pay

## 2019-10-12 DIAGNOSIS — I442 Atrioventricular block, complete: Secondary | ICD-10-CM | POA: Diagnosis not present

## 2019-10-12 NOTE — Telephone Encounter (Signed)
Pt called because he got my letter for missing his remote appointment 08-25-2019. I help the pt to send a manual transmission. Transmission received. I added the pt to the schedule.

## 2019-10-13 LAB — CUP PACEART REMOTE DEVICE CHECK
Date Time Interrogation Session: 20210126124626
Implantable Lead Implant Date: 20131118
Implantable Lead Implant Date: 20131118
Implantable Lead Location: 753859
Implantable Lead Location: 753860
Implantable Pulse Generator Implant Date: 20131118
Pulse Gen Model: 2110
Pulse Gen Serial Number: 7362469

## 2019-11-07 ENCOUNTER — Ambulatory Visit: Payer: Medicare HMO | Attending: Internal Medicine

## 2019-11-07 DIAGNOSIS — Z23 Encounter for immunization: Secondary | ICD-10-CM

## 2019-11-07 NOTE — Progress Notes (Signed)
   Covid-19 Vaccination Clinic  Name:  Robert Flynn    MRN: [id number] DOB: 1946-03-25  11/07/2019  [name] was observed post Covid-19 immunization for 15 minutes without incidence. He was provided with Vaccine Information Sheet and instruction to access the V-Safe system.   [name] was instructed to call 911 with any severe reactions post vaccine: Marland Kitchen Difficulty breathing  . Swelling of your face and throat  . A fast heartbeat  . A bad rash all over your body  . Dizziness and weakness    Immunizations Administered    Name Date Dose VIS Date Route   Pfizer COVID-19 Vaccine 11/07/2019 11:02 AM 0.3 mL 08/27/2019 Intramuscular   Manufacturer: [employer], [employer]   Lot: [id number]   NDC: [address]-8

## 2019-11-27 ENCOUNTER — Other Ambulatory Visit: Payer: Self-pay | Admitting: Family Medicine

## 2019-12-01 ENCOUNTER — Ambulatory Visit: Payer: Medicare HMO | Attending: Internal Medicine

## 2019-12-01 DIAGNOSIS — Z23 Encounter for immunization: Secondary | ICD-10-CM

## 2019-12-01 NOTE — Progress Notes (Signed)
   Covid-19 Vaccination Clinic  Name:  Robert Flynn    MRN: [id number] DOB: [birth year]  12/01/2019  [name] was observed post Covid-19 immunization for 15 minutes without incident. He was provided with Vaccine Information Sheet and instruction to access the V-Safe system.   [name] was instructed to call 911 with any severe reactions post vaccine: Marland Kitchen Difficulty breathing  . Swelling of face and throat  . A fast heartbeat  . A bad rash all over body  . Dizziness and weakness   Immunizations Administered    Name Date Dose VIS Date Route   Pfizer COVID-19 Vaccine 12/01/2019  9:02 AM 0.3 mL 08/27/2019 Intramuscular   Manufacturer: [employer], [employer]   Lot: [id number]   NDC: [address]-6

## 2019-12-10 ENCOUNTER — Other Ambulatory Visit: Payer: Self-pay | Admitting: Family Medicine

## 2019-12-10 DIAGNOSIS — M109 Gout, unspecified: Secondary | ICD-10-CM

## 2019-12-10 MED ORDER — COLCHICINE 0.6 MG PO CAPS: 0.6000 mg | ORAL_CAPSULE | Freq: Every day | ORAL | 0 refills | Status: DC

## 2019-12-22 ENCOUNTER — Other Ambulatory Visit: Payer: Self-pay | Admitting: Family Medicine

## 2020-01-11 ENCOUNTER — Ambulatory Visit (INDEPENDENT_AMBULATORY_CARE_PROVIDER_SITE_OTHER): Payer: Medicare HMO | Admitting: *Deleted

## 2020-01-11 DIAGNOSIS — I442 Atrioventricular block, complete: Secondary | ICD-10-CM | POA: Diagnosis not present

## 2020-01-12 ENCOUNTER — Telehealth: Payer: Self-pay

## 2020-01-12 LAB — CUP PACEART REMOTE DEVICE CHECK
Battery Remaining Longevity: 34 mo
Battery Remaining Percentage: 29 %
Battery Voltage: 2.81 V
Brady Statistic AP VP Percent: 27 %
Brady Statistic AP VS Percent: 1.2 %
Brady Statistic AS VP Percent: 70 %
Brady Statistic AS VS Percent: 1 %
Brady Statistic RA Percent Paced: 28 %
Brady Statistic RV Percent Paced: 98 %
Date Time Interrogation Session: 20210428094544
Implantable Lead Implant Date: 20131118
Implantable Lead Implant Date: 20131118
Implantable Lead Location: 753859
Implantable Lead Location: 753860
Implantable Pulse Generator Implant Date: 20131118
Lead Channel Impedance Value: 350 Ohm
Lead Channel Impedance Value: 450 Ohm
Lead Channel Pacing Threshold Amplitude: 0.75 V
Lead Channel Pacing Threshold Amplitude: 0.75 V
Lead Channel Pacing Threshold Pulse Width: 0.4 ms
Lead Channel Pacing Threshold Pulse Width: 0.4 ms
Lead Channel Sensing Intrinsic Amplitude: 4.9 mV
Lead Channel Sensing Intrinsic Amplitude: 8.5 mV
Lead Channel Setting Pacing Amplitude: 2 V
Lead Channel Setting Pacing Amplitude: 2.5 V
Lead Channel Setting Pacing Pulse Width: 0.4 ms
Lead Channel Setting Sensing Sensitivity: 2 mV
Pulse Gen Model: 2110
Pulse Gen Serial Number: 7362469

## 2020-01-12 NOTE — Progress Notes (Signed)
PPM Remote  

## 2020-01-12 NOTE — Telephone Encounter (Signed)
Spoke with patient to remind of missed remote transmission 

## 2020-03-27 ENCOUNTER — Ambulatory Visit (INDEPENDENT_AMBULATORY_CARE_PROVIDER_SITE_OTHER): Payer: Medicare HMO | Admitting: Family Medicine

## 2020-03-27 ENCOUNTER — Other Ambulatory Visit: Payer: Self-pay

## 2020-03-27 ENCOUNTER — Encounter: Payer: Self-pay | Admitting: Family Medicine

## 2020-03-27 VITALS — BP 152/76 | HR 97 | Ht 69.0 in | Wt 198.2 lb

## 2020-03-27 DIAGNOSIS — I10 Essential (primary) hypertension: Secondary | ICD-10-CM | POA: Diagnosis not present

## 2020-03-27 DIAGNOSIS — M109 Gout, unspecified: Secondary | ICD-10-CM | POA: Diagnosis not present

## 2020-03-27 DIAGNOSIS — R011 Cardiac murmur, unspecified: Secondary | ICD-10-CM | POA: Diagnosis not present

## 2020-03-27 DIAGNOSIS — E782 Mixed hyperlipidemia: Secondary | ICD-10-CM

## 2020-03-27 NOTE — Patient Instructions (Addendum)
High blood pressure: Please continue taking your medicaiton.  I like you to come back in 1 month for [place] to get a good blood pressure when you are on all of your medication.  If your blood pressure looks good when you are on your medication you are not having any lightheadedness or dizziness, we will not have to make any changes.  Gout: I am glad he had not been having any gout flares.  Sounds like you are doing a very good job controlling this with your diet.  We are going to get some blood work today to take a look at your kidneys and cholesterol.  I will let you know if any changes are needed.  Please remember to follow regularly with your cardiologist.

## 2020-03-27 NOTE — Progress Notes (Signed)
    SUBJECTIVE:   CHIEF COMPLAINT / HPI:   HTN He reports that he regularly takes his amlodipine and lisinopril and never misses doses.  However, he did not take his medication this morning.  He denies lightheadedness, dizziness.  Gout He reports that he is been very satisfied with the control of his gout in the past year.  He has made significant efforts to restrict his diet to reduce gout flares with great success.  His only used his colchicine once in the past year and that he can remember.  PERTINENT  PMH / PSH: Heart block with pacemaker  OBJECTIVE:   BP (!) 152/76   Pulse 97   Ht 5\' 9"  (1.753 m)   Wt 198 lb 4 oz (89.9 kg)   SpO2 97%   BMI 29.28 kg/m    General: Alert and cooperative and appears to be in no acute distress HEENT: Neck non-tender without lymphadenopathy, masses or thyromegaly Cardio: Normal S1 and S2, no S3 or S4. Rhythm is regular. No murmurs or rubs.   Pulm: Clear to auscultation bilaterally, no crackles, wheezing, or diminished breath sounds. Normal respiratory effort Abdomen: Bowel sounds normal. Abdomen soft and non-tender.  Extremities: No peripheral edema. Warm/ well perfused.  Strong radial pulse.  ASSESSMENT/PLAN:   HYPERTENSION, BENIGN SYSTEMIC He was encouraged to take his medications daily, especially when going to the doctor's office. -Continue amlodipine -Continue lisinopril -Follow-up in clinic in 1 month for blood pressure recheck, adjust for a systolic goal of under 150.  Gout Largely diet controlled.  No additional medication needed at this time.  Stable.     , MD [facility]

## 2020-03-27 NOTE — Assessment & Plan Note (Signed)
Largely diet controlled.  No additional medication needed at this time.  Stable.

## 2020-03-27 NOTE — Assessment & Plan Note (Signed)
He was encouraged to take his medications daily, especially when going to the doctor's office. -Continue amlodipine -Continue lisinopril -Follow-up in clinic in 1 month for blood pressure recheck, adjust for a systolic goal of under 150.

## 2020-03-27 NOTE — Addendum Note (Signed)
Addended by: Nada Libman on: 03/27/2020 07:34 PM   Modules accepted: Orders

## 2020-03-28 LAB — LIPID PANEL
Chol/HDL Ratio: 4.3 ratio (ref 0.0–5.0)
Cholesterol, Total: 154 mg/dL (ref 100–199)
HDL: 36 mg/dL — ABNORMAL LOW (ref >39)
LDL Chol Calc (NIH): 101 mg/dL — ABNORMAL HIGH (ref 0–99)
Triglycerides: 92 mg/dL (ref 0–149)
VLDL Cholesterol Cal: 17 mg/dL (ref 5–40)

## 2020-03-28 LAB — CBC
Hematocrit: 45.2 % (ref 37.5–51.0)
Hemoglobin: 15.1 g/dL (ref 13.0–17.7)
MCH: 29 pg (ref 26.6–33.0)
MCHC: 33.4 g/dL (ref 31.5–35.7)
MCV: 87 fL (ref 79–97)
Platelets: 70 10*3/uL — CL (ref 150–450)
RBC: 5.2 x10E6/uL (ref 4.14–5.80)
RDW: 15.1 % (ref 11.6–15.4)
WBC: 6.3 10*3/uL (ref 3.4–10.8)

## 2020-03-28 LAB — BASIC METABOLIC PANEL
BUN/Creatinine Ratio: 16 (ref 10–24)
BUN: 21 mg/dL (ref 8–27)
CO2: 20 mmol/L (ref 20–29)
Calcium: 9.7 mg/dL (ref 8.6–10.2)
Chloride: 107 mmol/L — ABNORMAL HIGH (ref 96–106)
Creatinine, Ser: 1.33 mg/dL — ABNORMAL HIGH (ref 0.76–1.27)
GFR calc Af Amer: 60 mL/min/{1.73_m2} (ref >59)
GFR calc non Af Amer: 52 mL/min/{1.73_m2} — ABNORMAL LOW (ref >59)
Glucose: 104 mg/dL — ABNORMAL HIGH (ref 65–99)
Potassium: 4 mmol/L (ref 3.5–5.2)
Sodium: 144 mmol/L (ref 134–144)

## 2020-04-05 ENCOUNTER — Other Ambulatory Visit: Payer: Self-pay | Admitting: Family Medicine

## 2020-04-05 DIAGNOSIS — D696 Thrombocytopenia, unspecified: Secondary | ICD-10-CM

## 2020-04-05 NOTE — Progress Notes (Signed)
  Robert Flynn was called informed that he has some low platelet level.  He is agreeable to return to clinic early next week for repeat blood work.  A visit was scheduled this coming Monday and blood work ordered.

## 2020-04-10 ENCOUNTER — Other Ambulatory Visit: Payer: Medicare HMO

## 2020-04-10 ENCOUNTER — Other Ambulatory Visit: Payer: Self-pay

## 2020-04-10 DIAGNOSIS — D696 Thrombocytopenia, unspecified: Secondary | ICD-10-CM

## 2020-04-11 LAB — CBC WITH DIFFERENTIAL
Basophils Absolute: 0.1 10*3/uL (ref 0.0–0.2)
Basos: 2 %
EOS (ABSOLUTE): 0.2 10*3/uL (ref 0.0–0.4)
Eos: 3 %
Hematocrit: 41.2 % (ref 37.5–51.0)
Hemoglobin: 14.1 g/dL (ref 13.0–17.7)
Immature Grans (Abs): 0 10*3/uL (ref 0.0–0.1)
Immature Granulocytes: 0 %
Lymphocytes Absolute: 2 10*3/uL (ref 0.7–3.1)
Lymphs: 39 %
MCH: 29.8 pg (ref 26.6–33.0)
MCHC: 34.2 g/dL (ref 31.5–35.7)
MCV: 87 fL (ref 79–97)
Monocytes Absolute: 0.4 10*3/uL (ref 0.1–0.9)
Monocytes: 7 %
Neutrophils Absolute: 2.6 10*3/uL (ref 1.4–7.0)
Neutrophils: 49 %
RBC: 4.73 x10E6/uL (ref 4.14–5.80)
RDW: 15 % (ref 11.6–15.4)
WBC: 5.3 10*3/uL (ref 3.4–10.8)

## 2020-04-12 ENCOUNTER — Other Ambulatory Visit: Payer: Self-pay | Admitting: Family Medicine

## 2020-04-12 DIAGNOSIS — D696 Thrombocytopenia, unspecified: Secondary | ICD-10-CM

## 2020-04-12 NOTE — Progress Notes (Signed)
dbd

## 2020-04-13 ENCOUNTER — Other Ambulatory Visit: Payer: Self-pay

## 2020-04-13 ENCOUNTER — Other Ambulatory Visit: Payer: Medicare HMO

## 2020-04-13 DIAGNOSIS — D696 Thrombocytopenia, unspecified: Secondary | ICD-10-CM | POA: Diagnosis not present

## 2020-04-14 LAB — CBC WITH DIFFERENTIAL/PLATELET
Basophils Absolute: 0.1 10*3/uL (ref 0.0–0.2)
Basos: 2 %
EOS (ABSOLUTE): 0.1 10*3/uL (ref 0.0–0.4)
Eos: 3 %
Hematocrit: 41.9 % (ref 37.5–51.0)
Hemoglobin: 13.8 g/dL (ref 13.0–17.7)
Immature Grans (Abs): 0 10*3/uL (ref 0.0–0.1)
Immature Granulocytes: 0 %
Lymphocytes Absolute: 1.6 10*3/uL (ref 0.7–3.1)
Lymphs: 36 %
MCH: 28.3 pg (ref 26.6–33.0)
MCHC: 32.9 g/dL (ref 31.5–35.7)
MCV: 86 fL (ref 79–97)
Monocytes Absolute: 0.4 10*3/uL (ref 0.1–0.9)
Monocytes: 8 %
Neutrophils Absolute: 2.3 10*3/uL (ref 1.4–7.0)
Neutrophils: 51 %
Platelets: 128 10*3/uL — ABNORMAL LOW (ref 150–450)
RBC: 4.87 x10E6/uL (ref 4.14–5.80)
RDW: 15.4 % (ref 11.6–15.4)
WBC: 4.4 10*3/uL (ref 3.4–10.8)

## 2020-04-17 ENCOUNTER — Telehealth: Payer: Self-pay | Admitting: *Deleted

## 2020-04-17 NOTE — Telephone Encounter (Signed)
Spoke with pt. Made appt for 8/23 at 10:30 to f/up on BP. Aquilla Solian, CMA

## 2020-04-17 NOTE — Telephone Encounter (Signed)
Unable to reach patient and no voicemail is available.   Will try again later.  Paolo Okane,CMA

## 2020-04-17 NOTE — Telephone Encounter (Signed)
-----   Message from Mirian Mo, MD sent at 04/16/2020  8:21 PM EDT ----- Regarding: follow up appointment Please let Mr. Hipple know that we do not need to do any additional blood work right now but I would like him to make an appointment to follow up on his blood pressure in 2-3 weeks.  We can talk more about his platelets at that visit.  Mirian Mo, MD

## 2020-04-17 NOTE — Telephone Encounter (Signed)
2nd attempt to reach pt. No voicemail set up. Unable to LVM. Will try later. Aquilla Solian, CMA

## 2020-05-08 ENCOUNTER — Ambulatory Visit (INDEPENDENT_AMBULATORY_CARE_PROVIDER_SITE_OTHER): Payer: Medicare HMO | Admitting: Family Medicine

## 2020-05-08 ENCOUNTER — Encounter: Payer: Self-pay | Admitting: Family Medicine

## 2020-05-08 ENCOUNTER — Other Ambulatory Visit: Payer: Self-pay

## 2020-05-08 VITALS — BP 152/70 | HR 80 | Ht 69.0 in | Wt 202.2 lb

## 2020-05-08 DIAGNOSIS — N529 Male erectile dysfunction, unspecified: Secondary | ICD-10-CM | POA: Diagnosis not present

## 2020-05-08 DIAGNOSIS — I10 Essential (primary) hypertension: Secondary | ICD-10-CM

## 2020-05-08 DIAGNOSIS — D696 Thrombocytopenia, unspecified: Secondary | ICD-10-CM | POA: Diagnosis not present

## 2020-05-08 MED ORDER — LISINOPRIL 20 MG PO TABS: 20.0000 mg | ORAL_TABLET | Freq: Every day | ORAL | 3 refills | Status: DC

## 2020-05-08 MED ORDER — TADALAFIL 20 MG PO TABS: 10.0000 mg | ORAL_TABLET | ORAL | 11 refills | Status: DC | PRN

## 2020-05-08 NOTE — Assessment & Plan Note (Addendum)
Poorly controlled today.  We will shoot for a blood pressure goal of under 150 systolic. -Continue amlodipine 10 mg daily -Increase lisinopril to 20 mg daily -Follow-up BMP at lab visit in 1 week

## 2020-05-08 NOTE — Patient Instructions (Addendum)
Low platelets: We are going to get another blood test today for a low platelet level to see if any follow-up is needed.  Blood pressure: Your blood pressure still little bit high today.  Going to increase your lisinopril to 20 mg daily.  Please come back to clinic in 1 week for a lab visit to make sure your kidneys are okay.  Erectile dysfunction: I have given you a prescription for Cialis.  Start with 10 mg if that does not work, you can increase to 2 pills.  If the 20 mg dose is not helpful, I can refer you to urology for additional assistance.  Colon cancer screening: Please let me know if you are interested in colon cancer screening in the future.  Return to clinic in 3 months.

## 2020-05-08 NOTE — Assessment & Plan Note (Signed)
-  Follow-up CBC -Follow-up pathologist review of smear

## 2020-05-08 NOTE — Progress Notes (Signed)
    SUBJECTIVE:   CHIEF COMPLAINT / HPI:   Thrombocytopenia At a previous visit, Mr. Giampietro was found to have thrombocytopenia and a repeat CBC showed his platelets were in the 120 range.  A pathologist review was ordered but not drawn at that time.  They will be drawn today.  Hypertension Is current blood pressure regimen includes amlodipine 10 mg and lisinopril 10 mg.  He denies any lightheadedness or dizziness when moving from a seated to standing position.  Erectile dysfunction He has previously used sildenafil but did not like it because he thought that it made his heart race.  He denied any episodes of lightheadedness or dizziness or falling or passing out related to sildenafil.  He believes that he has tried Cialis in the past and it was only inconvenient because of the pricing.  PERTINENT  PMH / PSH: Hypertension  OBJECTIVE:   BP (!) 152/70   Pulse 80   Ht 5\' 9"  (1.753 m)   Wt 202 lb 4 oz (91.7 kg)   SpO2 97%   BMI 29.87 kg/m    General: Alert and cooperative and appears to be in no acute distress HEENT: Neck non-tender without lymphadenopathy, masses or thyromegaly Cardio: Normal S1 and S2, no S3 or S4. Rhythm is regular. No murmurs or rubs.   Pulm: Clear to auscultation bilaterally, no crackles, wheezing, or diminished breath sounds. Normal respiratory effort Abdomen: Bowel sounds normal. Abdomen soft and non-tender.  Extremities: No peripheral edema. Warm/ well perfused.  Strong radial pulse. Neuro: Cranial nerves grossly intact   ASSESSMENT/PLAN:   HYPERTENSION, BENIGN SYSTEMIC Poorly controlled today.  We will shoot for a blood pressure goal of under 150 systolic. -Continue amlodipine 10 mg daily -Increase lisinopril to 20 mg daily -Follow-up BMP at lab visit in 1 week  IMPOTENCE, ORGANIC Low suspicion of hypotension with sildenafil based on his frequently elevated blood pressure.  Will restart Cialis today. -Cialis 10 mg, okay to use 20  mg  Thrombocytopenia (HCC) -Follow-up CBC -Follow-up pathologist review of smear   Colon cancer screening He has technically aged out of colon cancer screening although he is a generally healthy and well-appearing 74 year old male.  He is encouraged to consider future colon cancer screening if he desires.  He was interested in speaking with his wife before making a final decision about an additional colonoscopy.  71, MD [facility]

## 2020-05-08 NOTE — Assessment & Plan Note (Signed)
Low suspicion of hypotension with sildenafil based on his frequently elevated blood pressure.  Will restart Cialis today. -Cialis 10 mg, okay to use 20 mg

## 2020-05-09 LAB — CBC
Hematocrit: 38.7 % (ref 37.5–51.0)
Hemoglobin: 13.2 g/dL (ref 13.0–17.7)
MCH: 28.9 pg (ref 26.6–33.0)
MCHC: 34.1 g/dL (ref 31.5–35.7)
MCV: 85 fL (ref 79–97)
Platelets: 108 10*3/uL — ABNORMAL LOW (ref 150–450)
RBC: 4.56 x10E6/uL (ref 4.14–5.80)
RDW: 15.6 % — ABNORMAL HIGH (ref 11.6–15.4)
WBC: 5.4 10*3/uL (ref 3.4–10.8)

## 2020-05-10 LAB — PATHOLOGIST SMEAR REVIEW
Basophils Absolute: 0.1 10*3/uL (ref 0.0–0.2)
Basos: 1 %
EOS (ABSOLUTE): 0.2 10*3/uL (ref 0.0–0.4)
Eos: 3 %
Hematocrit: 40.3 % (ref 37.5–51.0)
Hemoglobin: 13.5 g/dL (ref 13.0–17.7)
Immature Grans (Abs): 0 10*3/uL (ref 0.0–0.1)
Immature Granulocytes: 0 %
Lymphocytes Absolute: 1.7 10*3/uL (ref 0.7–3.1)
Lymphs: 32 %
MCH: 28.7 pg (ref 26.6–33.0)
MCHC: 33.5 g/dL (ref 31.5–35.7)
MCV: 86 fL (ref 79–97)
Monocytes Absolute: 0.4 10*3/uL (ref 0.1–0.9)
Monocytes: 7 %
Neutrophils Absolute: 2.9 10*3/uL (ref 1.4–7.0)
Neutrophils: 57 %
Platelets: 118 10*3/uL — ABNORMAL LOW (ref 150–450)
RBC: 4.71 x10E6/uL (ref 4.14–5.80)
RDW: 15.8 % — ABNORMAL HIGH (ref 11.6–15.4)
WBC: 5.2 10*3/uL (ref 3.4–10.8)

## 2020-05-31 ENCOUNTER — Ambulatory Visit (INDEPENDENT_AMBULATORY_CARE_PROVIDER_SITE_OTHER): Payer: Medicare HMO | Admitting: Internal Medicine

## 2020-05-31 ENCOUNTER — Other Ambulatory Visit: Payer: Self-pay

## 2020-05-31 ENCOUNTER — Encounter: Payer: Self-pay | Admitting: Internal Medicine

## 2020-05-31 VITALS — BP 130/82 | HR 83 | Ht 69.0 in | Wt 203.0 lb

## 2020-05-31 DIAGNOSIS — I442 Atrioventricular block, complete: Secondary | ICD-10-CM | POA: Diagnosis not present

## 2020-05-31 DIAGNOSIS — Z950 Presence of cardiac pacemaker: Secondary | ICD-10-CM | POA: Diagnosis not present

## 2020-05-31 NOTE — Patient Instructions (Signed)
Medication Instructions:  Your physician recommends that you continue on your current medications as directed. Please refer to the Current Medication list given to you today.  Labwork: None ordered.  Testing/Procedures: None ordered.  Follow-Up: Your physician wants you to follow-up in: one year with  Dr. Graciela Husbands.   You will receive a reminder letter in the mail two months in advance. If you don't receive a letter, please call our office to schedule the follow-up appointment.  Remote monitoring is used to monitor your Pacemaker from home. This monitoring reduces the number of office visits required to check your device to one time per year. It allows [place] to keep an eye on the functioning of your device to ensure it is working properly. You are scheduled for a device check from home on 07/11/2020. You may send your transmission at any time that day. If you have a wireless device, the transmission will be sent automatically. After your physician reviews your transmission, you will receive a postcard with your next transmission date.  Any Other Special Instructions Will Be Listed Below (If Applicable).  If you need a refill on your cardiac medications before your next appointment, please call your pharmacy.

## 2020-05-31 NOTE — Progress Notes (Signed)
HPI [name] returns today for followup. He is a pleasant 74 yo man with HTN and CHB, s/p PPM insertion. He has done well in the interim with no chest pain or sob. He is still doing strenous physical work 3 hours a day. He has not had syncope or peripheral edema.  No Known Allergies   Current Outpatient Medications  Medication Sig Dispense Refill  . amLODipine (NORVASC) 10 MG tablet Take 1 tablet (10 mg total) by mouth daily. 60 tablet 2  . aspirin 81 MG tablet Take 81 mg by mouth daily.      . Colchicine (MITIGARE) 0.6 MG CAPS Take 0.6 mg by mouth daily. For gout flares, take 2 capsules followed by 1 capsule and hour later. After that, take 1 capsule daily until the flare resolves. 30 capsule 0  . lisinopril (ZESTRIL) 20 MG tablet Take 1 tablet (20 mg total) by mouth daily. 90 tablet 3  . simvastatin (ZOCOR) 40 MG tablet Take 1 tablet (40 mg total) by mouth at bedtime. 30 tablet 6  . tadalafil (CIALIS) 20 MG tablet Take 0.5-1 tablets (10-20 mg total) by mouth every other day as needed for erectile dysfunction. 10 tablet 11   No current facility-administered medications for this visit.     Past Medical History:  Diagnosis Date  . ADJUSTMENT DISORDER WITHOUT DEPRESSED MOOD   . ANXIETY   . DVT (deep venous thrombosis) (HCC)    of the right arm post pacemaker; on Xarelto for 3 months  . EXTERNAL HEMORRHOIDS   . Gout   . Hypertension   . OBESITY, NOS   . OSTEOARTHRITIS, MULTI SITES   . Pacemaker- St Judes    DOI 11/13  . Right bundle branch block and left anterior fascicular block Dec 2013   Progressive heart block with exercise>>3:1; s/p PTVP  . Sinus bradycardia     ROS:   All systems reviewed and negative except as noted in the HPI.   Past Surgical History:  Procedure Laterality Date  . PACEMAKER INSERTION  December 2013  . PERMANENT PACEMAKER INSERTION N/A 08/03/2012   Procedure: PERMANENT PACEMAKER INSERTION;  Surgeon: Duke Salvia, MD;  Location: [facility] CATH  LAB;  Service: Cardiovascular;  Laterality: N/A;     Family History  Problem Relation Age of Onset  . Heart disease Brother      Social History   Socioeconomic History  . Marital status: Married    Spouse name: Robert Flynn  . Number of children: 5  . Years of education: 9  . Highest education level: Not on file  Occupational History  . Occupation: RetiredTEFL teacher   Tobacco Use  . Smoking status: Never Smoker  . Smokeless tobacco: Never Used  Substance and Sexual Activity  . Alcohol use: Yes    Alcohol/week: 2.0 standard drinks    Types: 2 drink(s) per week  . Drug use: No  . Sexual activity: Not on file  Other Topics Concern  . Not on file  Social History Narrative   Health Care POA:    Emergency Contact: Son, Robert Flynn.    End of Life Plan: Patient does not have advance directives   Who lives with you: Wife   Any pets: none   Diet: Patient eats very little but diet is varied.  Eat an apple a day.   Exercise: Patient does not have regular exercise routine but reports walking a few times a week.    Seatbelts: Patient reports wearing  seat belt when in vehicle.    Hobbies: fishing            Social Determinants of Corporate investment banker Strain:   . Difficulty of Paying Living Expenses: Not on file  Food Insecurity:   . Worried About Programme researcher, broadcasting/film/video in the Last Year: Not on file  . Ran Out of Food in the Last Year: Not on file  Transportation Needs:   . Lack of Transportation (Medical): Not on file  . Lack of Transportation (Non-Medical): Not on file  Physical Activity:   . Days of Exercise per Week: Not on file  . Minutes of Exercise per Session: Not on file  Stress:   . Feeling of Stress : Not on file  Social Connections:   . Frequency of Communication with Friends and Family: Not on file  . Frequency of Social Gatherings with Friends and Family: Not on file  . Attends Religious Services: Not on file  . Active Member of Clubs or Organizations: Not  on file  . Attends Banker Meetings: Not on file  . Marital Status: Not on file  Intimate Partner Violence:   . Fear of Current or Ex-Partner: Not on file  . Emotionally Abused: Not on file  . Physically Abused: Not on file  . Sexually Abused: Not on file     BP 130/82   Pulse 83   Ht 5\' 9"  (1.753 m)   Wt 203 lb (92.1 kg)   SpO2 97%   BMI 29.98 kg/m   Physical Exam:  Well appearing NAD HEENT: Unremarkable Neck:  No JVD, no thyromegally Lymphatics:  No adenopathy Back:  No CVA tenderness Lungs:  Clear with no wheezes HEART:  Regular rate rhythm, no murmurs, no rubs, no clicks Abd:  soft, positive bowel sounds, no organomegally, no rebound, no guarding Ext:  2 plus pulses, no edema, no cyanosis, no clubbing Skin:  No rashes no nodules Neuro:  CN II through XII intact, motor grossly intact  EKG - nsr with pacing induced LBBB  DEVICE  Normal device function.  See PaceArt for details.   Assess/Plan: 1. CHB - he is asymptomatic, s/p PPM insertion.  2. HTN - his bp is minimally elevated. No change. 3. PPM - his St. Jude DDD PM is working normally. He will follow up with Dr. in a year.  Crissie Sickles.

## 2020-07-25 DIAGNOSIS — H2513 Age-related nuclear cataract, bilateral: Secondary | ICD-10-CM | POA: Diagnosis not present

## 2020-07-25 DIAGNOSIS — H40033 Anatomical narrow angle, bilateral: Secondary | ICD-10-CM | POA: Diagnosis not present

## 2020-08-01 ENCOUNTER — Ambulatory Visit (INDEPENDENT_AMBULATORY_CARE_PROVIDER_SITE_OTHER): Payer: Medicare HMO

## 2020-08-01 ENCOUNTER — Other Ambulatory Visit: Payer: Self-pay

## 2020-08-01 DIAGNOSIS — Z23 Encounter for immunization: Secondary | ICD-10-CM

## 2020-08-02 NOTE — Progress Notes (Signed)
   Covid-19 Vaccination Clinic  Name:  Robert Flynn    MRN: [id number] DOB: 1945-11-22  08/01/2020  Patient presents to nurse clinic for COVID booster and flu vaccination. Patient denies previous history of severe allergic reaction to vaccine and answers no to all screening questions. Administered COVID booster in RD, flu vaccine in LD, sites unremarkable, tolerated injection well.   [name] was observed post Covid-19 immunization for 15 minutes without incident. He was provided with Vaccine Information Sheet and instruction to access the V-Safe system.   [name] was instructed to call 911 with any severe reactions post vaccine: Difficulty breathing  Swelling of face and throat  A fast heartbeat  A bad rash all over body  Dizziness and weakness    Provided patient with updated immunization record and card.   Veronda Prude, RN

## 2020-08-08 ENCOUNTER — Other Ambulatory Visit: Payer: Self-pay | Admitting: Family Medicine

## 2020-08-22 ENCOUNTER — Other Ambulatory Visit: Payer: Self-pay

## 2020-08-22 DIAGNOSIS — M109 Gout, unspecified: Secondary | ICD-10-CM

## 2020-08-22 MED ORDER — COLCHICINE 0.6 MG PO CAPS: 0.6000 mg | ORAL_CAPSULE | Freq: Every day | ORAL | 0 refills | Status: DC

## 2020-10-18 ENCOUNTER — Ambulatory Visit (INDEPENDENT_AMBULATORY_CARE_PROVIDER_SITE_OTHER): Payer: Medicare Other

## 2020-10-18 DIAGNOSIS — I442 Atrioventricular block, complete: Secondary | ICD-10-CM | POA: Diagnosis not present

## 2020-10-18 LAB — CUP PACEART REMOTE DEVICE CHECK
Battery Remaining Longevity: 19 mo
Battery Remaining Percentage: 17 %
Battery Voltage: 2.75 V
Brady Statistic AP VP Percent: 19 %
Brady Statistic AP VS Percent: 1 %
Brady Statistic AS VP Percent: 81 %
Brady Statistic AS VS Percent: 1 %
Brady Statistic RA Percent Paced: 19 %
Brady Statistic RV Percent Paced: 99 %
Date Time Interrogation Session: 20220202093017
Implantable Lead Implant Date: 20131118
Implantable Lead Implant Date: 20131118
Implantable Lead Location: 753859
Implantable Lead Location: 753860
Implantable Pulse Generator Implant Date: 20131118
Lead Channel Impedance Value: 350 Ohm
Lead Channel Impedance Value: 430 Ohm
Lead Channel Pacing Threshold Amplitude: 0.75 V
Lead Channel Pacing Threshold Amplitude: 0.75 V
Lead Channel Pacing Threshold Pulse Width: 0.4 ms
Lead Channel Pacing Threshold Pulse Width: 0.4 ms
Lead Channel Sensing Intrinsic Amplitude: 5 mV
Lead Channel Sensing Intrinsic Amplitude: 9.7 mV
Lead Channel Setting Pacing Amplitude: 2 V
Lead Channel Setting Pacing Amplitude: 2.5 V
Lead Channel Setting Pacing Pulse Width: 0.4 ms
Lead Channel Setting Sensing Sensitivity: 2 mV
Pulse Gen Model: 2110
Pulse Gen Serial Number: 7362469

## 2020-10-23 NOTE — Progress Notes (Signed)
Remote pacemaker transmission.   

## 2021-01-17 ENCOUNTER — Ambulatory Visit (INDEPENDENT_AMBULATORY_CARE_PROVIDER_SITE_OTHER): Payer: Medicare Other

## 2021-01-17 DIAGNOSIS — I442 Atrioventricular block, complete: Secondary | ICD-10-CM | POA: Diagnosis not present

## 2021-01-19 LAB — CUP PACEART REMOTE DEVICE CHECK
Battery Remaining Longevity: 12 mo
Battery Remaining Percentage: 10 %
Battery Voltage: 2.71 V
Brady Statistic AP VP Percent: 22 %
Brady Statistic AP VS Percent: 1 %
Brady Statistic AS VP Percent: 78 %
Brady Statistic AS VS Percent: 1 %
Brady Statistic RA Percent Paced: 22 %
Brady Statistic RV Percent Paced: 99 %
Date Time Interrogation Session: 20220506091756
Implantable Lead Implant Date: 20131118
Implantable Lead Implant Date: 20131118
Implantable Lead Location: 753859
Implantable Lead Location: 753860
Implantable Pulse Generator Implant Date: 20131118
Lead Channel Impedance Value: 360 Ohm
Lead Channel Impedance Value: 430 Ohm
Lead Channel Pacing Threshold Amplitude: 0.75 V
Lead Channel Pacing Threshold Amplitude: 0.75 V
Lead Channel Pacing Threshold Pulse Width: 0.4 ms
Lead Channel Pacing Threshold Pulse Width: 0.4 ms
Lead Channel Sensing Intrinsic Amplitude: 4.9 mV
Lead Channel Sensing Intrinsic Amplitude: 9.7 mV
Lead Channel Setting Pacing Amplitude: 2 V
Lead Channel Setting Pacing Amplitude: 2.5 V
Lead Channel Setting Pacing Pulse Width: 0.4 ms
Lead Channel Setting Sensing Sensitivity: 2 mV
Pulse Gen Model: 2110
Pulse Gen Serial Number: 7362469

## 2021-01-29 ENCOUNTER — Other Ambulatory Visit: Payer: Self-pay | Admitting: Family Medicine

## 2021-02-06 NOTE — Progress Notes (Signed)
Remote pacemaker transmission.   

## 2021-02-11 ENCOUNTER — Other Ambulatory Visit: Payer: Self-pay | Admitting: Family Medicine

## 2021-06-02 NOTE — Progress Notes (Signed)
Electrophysiology Office Note Date: 06/04/2021  ID:  Robert Flynn 04-Mar-1946, MRN [id number]  PCP: Robert Erp, MD Primary Cardiologist: None Electrophysiologist: Robert Manges, MD   CC: Pacemaker follow-up  Robert Flynn is a 75 y.o. male seen today for Robert Manges, MD for routine electrophysiology followup.  Since last being seen in our clinic the patient reports doing very well. Has been out of amlodipine for approx 2 weeks. he denies chest pain, palpitations, dyspnea, PND, orthopnea, nausea, vomiting, dizziness, syncope, edema, weight gain, or early satiety.  Device History: St. Jude Dual Chamber PPM implanted 07/2012 for CHB  Past Medical History:  Diagnosis Date   ADJUSTMENT DISORDER WITHOUT DEPRESSED MOOD    ANXIETY    DVT (deep venous thrombosis) (HCC)    of the right arm post pacemaker; on Xarelto for 3 months   EXTERNAL HEMORRHOIDS    Gout    Hypertension    OBESITY, NOS    OSTEOARTHRITIS, MULTI SITES    Pacemaker- St Judes    DOI 11/13   Right bundle branch block and left anterior fascicular block Dec 2013   Progressive heart block with exercise>>3:1; s/p PTVP   Sinus bradycardia    Past Surgical History:  Procedure Laterality Date   PACEMAKER INSERTION  December 2013   PERMANENT PACEMAKER INSERTION N/A 08/03/2012   Procedure: PERMANENT PACEMAKER INSERTION;  Surgeon: Robert Salvia, MD;  Location: [facility] CATH LAB;  Service: Cardiovascular;  Laterality: N/A;    Current Outpatient Medications  Medication Sig Dispense Refill   amLODipine (NORVASC) 10 MG tablet TAKE 1 TABLET EVERY DAY 90 tablet 3   aspirin 81 MG tablet Take 81 mg by mouth daily.       Colchicine (MITIGARE) 0.6 MG CAPS Take 0.6 mg by mouth daily. For gout flares, take 2 capsules followed by 1 capsule and hour later. After that, take 1 capsule daily until the flare resolves. 30 capsule 0   lisinopril (ZESTRIL) 20 MG tablet Take 1 tablet (20 mg total) by mouth daily. 90 tablet 3    simvastatin (ZOCOR) 40 MG tablet TAKE 1 TABLET (40 MG TOTAL) BY MOUTH AT BEDTIME. 90 tablet 3   tadalafil (CIALIS) 20 MG tablet TAKE 1/2 TO 1 (ONE-HALF TO ONE) TABLET BY MOUTH EVERY OTHER DAY AS NEEDED FOR ERECTILE DYSFUNCTION 10 tablet 4   No current facility-administered medications for this visit.    Allergies:   Patient has no known allergies.   Social History: Social History   Socioeconomic History   Marital status: Married    Spouse name: Robert Flynn   Number of children: 5   Years of education: 9   Highest education level: Not on file  Occupational History   Occupation: RetiredTEFL teacher   Tobacco Use   Smoking status: Never   Smokeless tobacco: Never  Substance and Sexual Activity   Alcohol use: Yes    Alcohol/week: 2.0 standard drinks    Types: 2 drink(s) per week   Drug use: No   Sexual activity: Not on file  Other Topics Concern   Not on file  Social History Narrative   Health Care POA:    Emergency Contact: Son, Robert Flynn.    End of Life Plan: Patient does not have advance directives   Who lives with you: Wife   Any pets: none   Diet: Patient eats very little but diet is varied.  Eat an apple a day.   Exercise: Patient does not have regular exercise routine  but reports walking a few times a week.    Seatbelts: Patient reports wearing seat belt when in vehicle.    Hobbies: fishing            Social Determinants of Corporate investment banker Strain: Not on file  Food Insecurity: Not on file  Transportation Needs: Not on file  Physical Activity: Not on file  Stress: Not on file  Social Connections: Not on file  Intimate Partner Violence: Not on file    Family History: Family History  Problem Relation Age of Onset   Heart disease Brother      Review of Systems: All other systems reviewed and are otherwise negative except as noted above.  Physical Exam: Vitals:   06/04/21 0953  BP: (!) 160/80  Pulse: 81  SpO2: 98%  Weight: 193 lb 12.8 oz  (87.9 kg)  Height: 5' 9.5" (1.765 m)     GEN- The patient is well appearing, alert and oriented x 3 today.   HEENT: normocephalic, atraumatic; sclera clear, conjunctiva pink; hearing intact; oropharynx clear; neck supple  Lungs- Clear to ausculation bilaterally, normal work of breathing.  No wheezes, rales, rhonchi Heart- Regular rate and rhythm, no murmurs, rubs or gallops  GI- soft, non-tender, non-distended, bowel sounds present  Extremities- no clubbing or cyanosis. No edema MS- no significant deformity or atrophy Skin- warm and dry, no rash or lesion; PPM pocket well healed Psych- euthymic mood, full affect Neuro- strength and sensation are intact  PPM Interrogation- reviewed in detail today,  See PACEART report  EKG:  EKG is not ordered today.  Recent Labs: No results found for requested labs within last 8760 hours.   Wt Readings from Last 3 Encounters:  06/04/21 193 lb 12.8 oz (87.9 kg)  05/31/20 203 lb (92.1 kg)  05/08/20 202 lb 4 oz (91.7 kg)     Other studies Reviewed: Additional studies/ records that were reviewed today include: Previous EP office notes, Previous remote checks, Most recent labwork.   Assessment and Plan:  1. CHB s/p St. Jude PPM  Normal PPM function See Robert Flynn Art report No changes today He has 2-3 months until ERI. Updated reports to monthly. 2-3 month visit with Dr. Graciela Flynn to discuss as he has not seen him in Quite some time.   2. HTN Elevated today off amlodipine x 2 weeks. Will refill and follow with home cuff  Current medicines are reviewed at length with the patient today.    Disposition:   Follow up with Dr. Graciela Flynn in 2-3 months. Sooner if meets ERI prior to then. Pt notifier played for pt and daughter today.   Robert Flock, PA-C  06/04/2021 10:11 AM  [facility] [address] [place] [address] [phone] (office) [phone] (fax)

## 2021-06-04 ENCOUNTER — Encounter: Payer: Self-pay | Admitting: Student

## 2021-06-04 ENCOUNTER — Ambulatory Visit (INDEPENDENT_AMBULATORY_CARE_PROVIDER_SITE_OTHER): Payer: Medicare Other | Admitting: Student

## 2021-06-04 ENCOUNTER — Other Ambulatory Visit: Payer: Self-pay

## 2021-06-04 VITALS — BP 160/80 | HR 81 | Ht 69.5 in | Wt 193.8 lb

## 2021-06-04 DIAGNOSIS — I442 Atrioventricular block, complete: Secondary | ICD-10-CM

## 2021-06-04 DIAGNOSIS — Z950 Presence of cardiac pacemaker: Secondary | ICD-10-CM

## 2021-06-04 DIAGNOSIS — I10 Essential (primary) hypertension: Secondary | ICD-10-CM | POA: Diagnosis not present

## 2021-06-04 LAB — CUP PACEART INCLINIC DEVICE CHECK
Battery Remaining Longevity: 2 mo
Battery Voltage: 2.65 V
Brady Statistic RA Percent Paced: 27 %
Brady Statistic RV Percent Paced: 99.94 %
Date Time Interrogation Session: 20220919101541
Implantable Lead Implant Date: 20131118
Implantable Lead Implant Date: 20131118
Implantable Lead Location: 753859
Implantable Lead Location: 753860
Implantable Pulse Generator Implant Date: 20131118
Lead Channel Impedance Value: 362.5 Ohm
Lead Channel Impedance Value: 437.5 Ohm
Lead Channel Pacing Threshold Amplitude: 0.75 V
Lead Channel Pacing Threshold Amplitude: 0.75 V
Lead Channel Pacing Threshold Amplitude: 1 V
Lead Channel Pacing Threshold Amplitude: 1 V
Lead Channel Pacing Threshold Pulse Width: 0.4 ms
Lead Channel Pacing Threshold Pulse Width: 0.4 ms
Lead Channel Pacing Threshold Pulse Width: 0.4 ms
Lead Channel Pacing Threshold Pulse Width: 0.4 ms
Lead Channel Sensing Intrinsic Amplitude: 11.7 mV
Lead Channel Sensing Intrinsic Amplitude: 5 mV
Lead Channel Setting Pacing Amplitude: 2 V
Lead Channel Setting Pacing Amplitude: 2.5 V
Lead Channel Setting Pacing Pulse Width: 0.4 ms
Lead Channel Setting Sensing Sensitivity: 2 mV
Pulse Gen Model: 2110
Pulse Gen Serial Number: 7362469

## 2021-06-04 MED ORDER — AMLODIPINE BESYLATE 10 MG PO TABS: 10.0000 mg | ORAL_TABLET | Freq: Every day | ORAL | 3 refills | Status: DC

## 2021-06-04 MED ORDER — LISINOPRIL 20 MG PO TABS: 20.0000 mg | ORAL_TABLET | Freq: Every day | ORAL | 3 refills | Status: DC

## 2021-06-04 NOTE — Patient Instructions (Addendum)
Medication Instructions:  Your physician recommends that you continue on your current medications as directed. Please refer to the Current Medication list given to you today.  *If you need a refill on your cardiac medications before your next appointment, please call your pharmacy*   Lab Work: None  If you have labs (blood work) drawn today and your tests are completely normal, you will receive your results only by: MyChart Message (if you have MyChart) OR A paper copy in the mail If you have any lab test that is abnormal or we need to change your treatment, we will call you to review the results.    Follow-Up: At [facility], you and your health needs are our priority.  As part of our continuing mission to provide you with exceptional heart care, we have created designated Provider Care Teams.  These Care Teams include your primary Cardiologist (physician) and Advanced Practice Providers (APPs -  Physician Assistants and Nurse Practitioners) who all work together to provide you with the care you need, when you need it.  We recommend signing up for the patient portal called "MyChart".  Sign up information is provided on this After Visit Summary.  MyChart is used to connect with patients for Virtual Visits (Telemedicine).  Patients are able to view lab/test results, encounter notes, upcoming appointments, etc.  Non-urgent messages can be sent to your provider as well.   To learn more about what you can do with MyChart, go to [email or web address].    Your next appointment:   09/11/2021

## 2021-06-14 ENCOUNTER — Other Ambulatory Visit: Payer: Self-pay | Admitting: Family Medicine

## 2021-06-18 ENCOUNTER — Ambulatory Visit (INDEPENDENT_AMBULATORY_CARE_PROVIDER_SITE_OTHER): Payer: Medicare Other

## 2021-06-18 DIAGNOSIS — I442 Atrioventricular block, complete: Secondary | ICD-10-CM

## 2021-06-19 LAB — CUP PACEART REMOTE DEVICE CHECK
Battery Remaining Longevity: 1 mo
Battery Remaining Percentage: 1 %
Battery Voltage: 2.65 V
Brady Statistic AP VP Percent: 40 %
Brady Statistic AP VS Percent: 1 %
Brady Statistic AS VP Percent: 60 %
Brady Statistic AS VS Percent: 1 %
Brady Statistic RA Percent Paced: 40 %
Brady Statistic RV Percent Paced: 99 %
Date Time Interrogation Session: 20221003075211
Implantable Lead Implant Date: 20131118
Implantable Lead Implant Date: 20131118
Implantable Lead Location: 753859
Implantable Lead Location: 753860
Implantable Pulse Generator Implant Date: 20131118
Lead Channel Impedance Value: 360 Ohm
Lead Channel Impedance Value: 410 Ohm
Lead Channel Pacing Threshold Amplitude: 0.75 V
Lead Channel Pacing Threshold Amplitude: 1 V
Lead Channel Pacing Threshold Pulse Width: 0.4 ms
Lead Channel Pacing Threshold Pulse Width: 0.4 ms
Lead Channel Sensing Intrinsic Amplitude: 11.7 mV
Lead Channel Sensing Intrinsic Amplitude: 4 mV
Lead Channel Setting Pacing Amplitude: 2 V
Lead Channel Setting Pacing Amplitude: 2.5 V
Lead Channel Setting Pacing Pulse Width: 0.4 ms
Lead Channel Setting Sensing Sensitivity: 2 mV
Pulse Gen Model: 2110
Pulse Gen Serial Number: 7362469

## 2021-06-25 NOTE — Addendum Note (Signed)
Addended by: Elease Etienne A on: 06/25/2021 02:32 PM   Modules accepted: Level of Service

## 2021-06-25 NOTE — Progress Notes (Signed)
Remote pacemaker transmission.   

## 2021-07-09 NOTE — Progress Notes (Signed)
    SUBJECTIVE:   Chief compliant/HPI: annual examination  Robert Flynn is a 75 y.o. who presents today for an annual exam.   Hypertension Pt does not check BP at home but denies any headaches, chest pain, shortness of breath or acute vision changes. Takes his amlodipine and lisinopril daily.  Grief Patient lost daughter to bone or blood cancer 4 months ago at the age of 56. Patient has been meeting with family monthly for support and lives with his wife. Not currently interested in therapy/counseling.  Eye Tearing In the morning in both eyes he has some tearing. He uses eye drops because it is itchy sometimes. Has been going on for a year on and off and only in mornings. No photophobia. 3 months ago got his eyes checked by eye doctor who said no issues and prescribed reading glasses.  ED Cialis helping but has run out. Denies any symptoms of hypotension such as fainting or lightheadedness.  Gout  Usually in his big toes. He is doing well especially when he stays away from fried food and citrus. Has not had a gout flare per patient in the last 1-2 years. He takes colchicine when he feels a flare coming on.  Hx of Thrombocytopenia Has history of likely pseudothrombocytopenia on labs in past.  History tabs reviewed.   Review of systems negative for headaches, chest pain, shortness of breath, abdominal pain, constipation/diarrhea, swelling  OBJECTIVE:   BP (!) 158/72   Pulse 72   Wt 196 lb (88.9 kg)   SpO2 98%   BMI 28.53 kg/m   General: NAD, awake, alert, responsive to questions Head: Normocephalic atraumatic CV: Regular rate and rhythm no murmurs rubs or gallops Respiratory: Clear to ausculation bilaterally, no wheezes rales or crackles Abdomen: Soft, non-tender, non-distended, normoactive bowel sounds  Extremities: Moves upper and lower extremities freely, no edema in LE, 2+ pedal edema Neuro: No focal deficits Skin: No rashes or lesions visualized   ASSESSMENT/PLAN:    HYPERTENSION, BENIGN SYSTEMIC BP initially was 160/70 and was 158/72 on repeat. Has been taking amlodipine 10 mg and lisinopril 20 mg daily. -Advised increase to 40 mg lisinopril daily -continue amlodipine 10 mg -BMP today to check creatinine -nurse visit in 1 week to recheck creatinine and BP check  Gout Well controlled with diet changes. -refilled colchicine  Thrombocytopenia (HCC) Likely pseudothrombocytopenia based on previous CBC. -Ordered CBC with a non EDTA tube. -f/u results  Grief PHQ-9 0. Patient lost daughter to cancer 4 months ago. Not currently interested in counseling -Provided list of therapy/counseling resources on AVS   Eye tearing, bilateral Likely related to allergies as patient says it happens in morning and is itching in nature. Recent eye appointment normal per patient and they advised OTC eye drops. -Advised continued eye drops as this has been helping him  Annual Examination  See AVS for age appropriate recommendations.  PHQ score 0, reviewed and discussed.   Considered flu and covid booster today however vaccine system down today in clinic.  Follow up in 1 week for nurse visit checking BP and repeat creatinine.   Levin Erp, MD [facility]

## 2021-07-10 ENCOUNTER — Encounter: Payer: Self-pay | Admitting: Student

## 2021-07-10 ENCOUNTER — Ambulatory Visit (INDEPENDENT_AMBULATORY_CARE_PROVIDER_SITE_OTHER): Payer: Medicare Other | Admitting: Student

## 2021-07-10 ENCOUNTER — Other Ambulatory Visit: Payer: Self-pay

## 2021-07-10 VITALS — BP 158/72 | HR 72 | Wt 196.0 lb

## 2021-07-10 DIAGNOSIS — I10 Essential (primary) hypertension: Secondary | ICD-10-CM | POA: Diagnosis not present

## 2021-07-10 DIAGNOSIS — N529 Male erectile dysfunction, unspecified: Secondary | ICD-10-CM | POA: Diagnosis not present

## 2021-07-10 DIAGNOSIS — D696 Thrombocytopenia, unspecified: Secondary | ICD-10-CM | POA: Diagnosis not present

## 2021-07-10 DIAGNOSIS — H04203 Unspecified epiphora, bilateral lacrimal glands: Secondary | ICD-10-CM | POA: Diagnosis not present

## 2021-07-10 DIAGNOSIS — M109 Gout, unspecified: Secondary | ICD-10-CM

## 2021-07-10 DIAGNOSIS — Z0001 Encounter for general adult medical examination with abnormal findings: Secondary | ICD-10-CM | POA: Diagnosis not present

## 2021-07-10 DIAGNOSIS — F4321 Adjustment disorder with depressed mood: Secondary | ICD-10-CM

## 2021-07-10 MED ORDER — COLCHICINE 0.6 MG PO CAPS: 0.6000 mg | ORAL_CAPSULE | Freq: Every day | ORAL | 0 refills | Status: DC

## 2021-07-10 MED ORDER — TADALAFIL 20 MG PO TABS: ORAL_TABLET | ORAL | 4 refills | Status: DC

## 2021-07-10 NOTE — Assessment & Plan Note (Signed)
BP initially was 160/70 and was 158/72 on repeat. Has been taking amlodipine 10 mg and lisinopril 20 mg daily. -Advised increase to 40 mg lisinopril daily -continue amlodipine 10 mg -BMP today to check creatinine -nurse visit in 1 week to recheck creatinine and BP check

## 2021-07-10 NOTE — Patient Instructions (Addendum)
It was great to see you! Thank you for allowing me to participate in your care!   I recommend that you always bring your medications to each appointment as this makes it easy to ensure we are on the correct medications and helps [place] not miss when refills are needed.  Our plans for today:  - Your blood pressure is elevated today. Please take 2 lisinopril tablets (40 mg total) instead of 20 mg. Continue taking your amlodipine. If possible please check your blood pressure at home. We will have you come back in 1 week to check your BP and kidney levels. - I have attached therapy and counseling resources below -please wear your reading glasses as much as possible -continue walking and eating 1/2 your plate of vegetables -I refilled your colchicine and tadalafil -We are checking some labs today, I will call you if they are abnormal will send you a MyChart message or a letter if they are normal.  If you do not hear about your labs in the next 2 weeks please let us know.  Take care and seek immediate care sooner if you develop any concerns. Please remember to show up 15 minutes before your scheduled appointment time!  Levin Erp, MD [facility]   Therapy and Counseling Resources Most providers on this list will take Medicaid. Patients with commercial insurance or Medicare should contact their insurance company to get a list of in network providers.  [facility] -Grief counseling  [facility]:Psychiatry and Counseling [address] [place], [place] [address] [phone]  [facility]  [address] Suite [place], [place] [address]      [phone]  [facility] [address]  [place], [place] [address] [phone]  [facility] [address], [address]  [place], [place] [address]       [phone]     [facility] (virtual only) [phone]  Jovita Kussmaul [facility] [address],  [place], [place] [phone]  [facility]:  [address] [place] [phone]  [facility]:  [address] [place] [phone]  [facility] (under & uninsured) [address]   [place] [phone]    [email or web address]    [facility] [address]  [place]    [phone]  [facility] -[address]     Phone:  [phone]     [facility]-  [address]  [phone]   [facility] [address]      [place], [place] [phone]  [facility]: [address] [place], [place], [place]  [phone]   [facility] (Spanish therapist) [email or web address]  [address] [place]  [address]   [place] [address]    [phone]    [facility]   [address],  [place], [place]  [phone]   [facility]  [address] [place]  [phone]  [facility]  [address] [place], [place]        [phone]  Open Access/Walk In Clinic under & uninsured  [facility]  [address] [place], [place] [phone] Crisis [phone]  [facility] [address],  (Spanish)   [address], [place]: [phone]) 8:30 - 12; 1 - 2:30  [facility],  [address] [place]    [phone]):8:30 - 12; 2 - 3PM  [facility] [employer],  [address],  [place]; [phone]):   Mon - Fri 8 AM - 5 PM  [facility] [address] [place]  MWF 12:30 to 3:00 or call to schedule an appointment  [phone]  Specific Provider options Psychology Today  [email or web address]/us click on find a therapist  enter your zip code left side and select or tailor a therapist for your specific need.   [facility] Provider  Directory [email or web address]  (Medicaid)   Follow all drop down to find a provider  Social Support program [facility] [place] [phone] or PhotoSolver.pl [address], [place], [place] Recovery support and educational   24- Hour Availability:   [facility]  [address] [place], [place] [phone] Crisis [phone]  [facility] [phone]  [facility]  [phone]   [facility]  [phone] (after hours)  Therapeutic Alternative/Mobile Crisis   [phone]  [place] National Suicide Hotline  [phone] Len Childs)  Call 911 or go to emergency room  [facility]  [phone]);  [place] and [employer]  [phone]); [place], [place], [place], [place], [place], [place], [place]

## 2021-07-10 NOTE — Assessment & Plan Note (Signed)
PHQ-9 0. Patient lost daughter to cancer 4 months ago. Not currently interested in counseling -Provided list of therapy/counseling resources on AVS

## 2021-07-10 NOTE — Assessment & Plan Note (Signed)
Likely pseudothrombocytopenia based on previous CBC. -Ordered CBC with a non EDTA tube. -f/u results

## 2021-07-10 NOTE — Assessment & Plan Note (Signed)
Likely related to allergies as patient says it happens in morning and is itching in nature. Recent eye appointment normal per patient and they advised OTC eye drops. -Advised continued eye drops as this has been helping him

## 2021-07-10 NOTE — Assessment & Plan Note (Signed)
Well controlled with diet changes. -refilled colchicine

## 2021-07-13 ENCOUNTER — Other Ambulatory Visit: Payer: Self-pay | Admitting: Family Medicine

## 2021-07-13 DIAGNOSIS — N529 Male erectile dysfunction, unspecified: Secondary | ICD-10-CM

## 2021-07-18 ENCOUNTER — Other Ambulatory Visit: Payer: Self-pay

## 2021-07-18 ENCOUNTER — Ambulatory Visit: Payer: Medicare Other

## 2021-07-18 ENCOUNTER — Ambulatory Visit (INDEPENDENT_AMBULATORY_CARE_PROVIDER_SITE_OTHER): Payer: Medicare Other

## 2021-07-18 ENCOUNTER — Other Ambulatory Visit: Payer: Medicare Other

## 2021-07-18 VITALS — BP 150/81 | HR 88

## 2021-07-18 DIAGNOSIS — D696 Thrombocytopenia, unspecified: Secondary | ICD-10-CM | POA: Diagnosis not present

## 2021-07-18 DIAGNOSIS — Z0130 Encounter for examination of blood pressure without abnormal findings: Secondary | ICD-10-CM

## 2021-07-18 DIAGNOSIS — I10 Essential (primary) hypertension: Secondary | ICD-10-CM | POA: Diagnosis not present

## 2021-07-18 NOTE — Progress Notes (Signed)
Ideally, per JNC8, his BP goal should be <150/90. Almost at goal. Pharmacy f/u for medication management and treatment goal clarification is appropriate.

## 2021-07-18 NOTE — Progress Notes (Signed)
Patient here today for BP check.      Last BP was on 07/10/2021 and was 158/72.  BP today is 150/81 with a pulse of 88.    Checked BP in left arm with regular adult cuff.    Symptoms present: none.   Patient last took BP med this AM. However, patient reports that this week he has only been taking 20 mg of Lisinopril. States that he forgot that he was supposed to be taking 40 mg. Precepted with Dr. Lum Babe who recommended close follow up with pharmacy team next week. Patient scheduled with Rachelle on 11/10.  Veronda Prude, RN

## 2021-07-19 LAB — CBC WITH DIFFERENTIAL/PLATELET
Basophils Absolute: 0.1 10*3/uL (ref 0.0–0.2)
Basos: 2 %
EOS (ABSOLUTE): 0.2 10*3/uL (ref 0.0–0.4)
Eos: 3 %
Hematocrit: 38.8 % (ref 37.5–51.0)
Hemoglobin: 13.5 g/dL (ref 13.0–17.7)
Immature Grans (Abs): 0 10*3/uL (ref 0.0–0.1)
Immature Granulocytes: 0 %
Lymphocytes Absolute: 1.7 10*3/uL (ref 0.7–3.1)
Lymphs: 32 %
MCH: 28.9 pg (ref 26.6–33.0)
MCHC: 34.8 g/dL (ref 31.5–35.7)
MCV: 83 fL (ref 79–97)
Monocytes Absolute: 0.4 10*3/uL (ref 0.1–0.9)
Monocytes: 8 %
Neutrophils Absolute: 3 10*3/uL (ref 1.4–7.0)
Neutrophils: 55 %
RBC: 4.67 x10E6/uL (ref 4.14–5.80)
RDW: 17 % — ABNORMAL HIGH (ref 11.6–15.4)
WBC: 5.4 10*3/uL (ref 3.4–10.8)

## 2021-07-19 LAB — BASIC METABOLIC PANEL
BUN/Creatinine Ratio: 15 (ref 10–24)
BUN: 20 mg/dL (ref 8–27)
CO2: 24 mmol/L (ref 20–29)
Calcium: 9.4 mg/dL (ref 8.6–10.2)
Chloride: 106 mmol/L (ref 96–106)
Creatinine, Ser: 1.37 mg/dL — ABNORMAL HIGH (ref 0.76–1.27)
Glucose: 120 mg/dL — ABNORMAL HIGH (ref 70–99)
Potassium: 4.4 mmol/L (ref 3.5–5.2)
Sodium: 143 mmol/L (ref 134–144)
eGFR: 53 mL/min/{1.73_m2} — ABNORMAL LOW (ref >59)

## 2021-07-23 ENCOUNTER — Encounter: Payer: Self-pay | Admitting: Student

## 2021-07-26 ENCOUNTER — Encounter: Payer: Self-pay | Admitting: Pharmacist

## 2021-07-26 ENCOUNTER — Other Ambulatory Visit: Payer: Self-pay

## 2021-07-26 ENCOUNTER — Ambulatory Visit (INDEPENDENT_AMBULATORY_CARE_PROVIDER_SITE_OTHER): Payer: Medicare Other | Admitting: Pharmacist

## 2021-07-26 VITALS — BP 152/72

## 2021-07-26 DIAGNOSIS — Z23 Encounter for immunization: Secondary | ICD-10-CM

## 2021-07-26 DIAGNOSIS — I10 Essential (primary) hypertension: Secondary | ICD-10-CM

## 2021-07-26 LAB — CUP PACEART REMOTE DEVICE CHECK
Battery Remaining Longevity: 1 mo
Battery Remaining Percentage: 1 %
Battery Voltage: 2.63 V
Brady Statistic AP VP Percent: 33 %
Brady Statistic AP VS Percent: 1 %
Brady Statistic AS VP Percent: 67 %
Brady Statistic AS VS Percent: 1 %
Brady Statistic RA Percent Paced: 33 %
Brady Statistic RV Percent Paced: 99 %
Date Time Interrogation Session: 20221110100617
Implantable Lead Implant Date: 20131118
Implantable Lead Implant Date: 20131118
Implantable Lead Location: 753859
Implantable Lead Location: 753860
Implantable Pulse Generator Implant Date: 20131118
Lead Channel Impedance Value: 350 Ohm
Lead Channel Impedance Value: 430 Ohm
Lead Channel Pacing Threshold Amplitude: 0.75 V
Lead Channel Pacing Threshold Amplitude: 1 V
Lead Channel Pacing Threshold Pulse Width: 0.4 ms
Lead Channel Pacing Threshold Pulse Width: 0.4 ms
Lead Channel Sensing Intrinsic Amplitude: 11.7 mV
Lead Channel Sensing Intrinsic Amplitude: 5 mV
Lead Channel Setting Pacing Amplitude: 2 V
Lead Channel Setting Pacing Amplitude: 2.5 V
Lead Channel Setting Pacing Pulse Width: 0.4 ms
Lead Channel Setting Sensing Sensitivity: 2 mV
Pulse Gen Model: 2110
Pulse Gen Serial Number: 7362469

## 2021-07-26 NOTE — Assessment & Plan Note (Signed)
Hypertension asymptomatic, needs further observation, and needs improvement.  Recommended sodium restriction. Unable to confirm white coat hypertension as patient does not check blood pressure at home therefore will have patient return for 24 hour ambulatory blood pressure monitor. Follow up: 1 week and as needed.  -Continued amlodipine 10mg  and lisinopril 40mg .

## 2021-07-26 NOTE — Progress Notes (Signed)
   Subjective:    Patient ID: Robert Flynn, male    DOB: 12/17/1945, 75 y.o.   MRN: [id number]  HPI Patient is a 75 y.o. male who presents for hypertension management. He is in good spirits and presents without assistance. Patient was referred on 07/18/21 and last seen by Primary Care Provider on 07/10/21.  Patient states "I think my blood pressure is just always high when I am in this office."  Hypertension ROS: taking medications as instructed, no medication side effects noted, patient does not perform home BP monitoring, no chest pain on exertion, no dyspnea on exertion, and no swelling of ankles.   Antihypertensives: amlodipine 10mg , lisinopril 20mg  (patient instructed to increase to 40mg  once daily and began doing so starting 07/18/21).  Dietary habits include: 1/2 week cook at home and other 1/2 will get takeout ([employer]) Exercise habits include: denies  Objective:   Last 3 Office BP readings: BP Readings from Last 3 Encounters:  07/26/21 (!) 152/72  07/18/21 (!) 150/81  07/10/21 (!) 158/72    BMET    Component Value Date/Time   NA 143 07/18/2021 0914   K 4.4 07/18/2021 0914   CL 106 07/18/2021 0914   CO2 24 07/18/2021 0914   GLUCOSE 120 (H) 07/18/2021 0914   GLUCOSE 150 (H) 09/04/2016 1054   BUN 20 07/18/2021 0914   CREATININE 1.37 (H) 07/18/2021 0914   CREATININE 1.34 (H) 09/04/2016 1054   CALCIUM 9.4 07/18/2021 0914   GFRNONAA 52 (L) 03/27/2020 1044   GFRAA 60 03/27/2020 1044    Renal function: CrCl cannot be calculated (Unknown ideal weight.).  BP (!) 152/72   Appearance alert, well appearing, and in no distress and oriented to person, place, and time. General exam BP noted to be moderately elevated today in office.   Assessment/Plan:  Hypertension asymptomatic, needs further observation, and needs improvement.  Recommended sodium restriction. Unable to confirm white coat hypertension as patient does not check blood pressure at home therefore will  have patient return for 24 hour ambulatory blood pressure monitor. Follow up: 1 week and as needed.  -Continued amlodipine 10mg  and lisinopril 40mg .   Results reviewed and written information provided.   Total time in face-to-face counseling 30 minutes.

## 2021-07-26 NOTE — Patient Instructions (Signed)
Robert Flynn it was a pleasure seeing you today.   Your blood pressure today was still elevated.   Continue taking blood pressure medications as prescribed.   Limiting salt intake and caffeine.  Exercising as able for at least 30 minutes for 5 days out of the week, can also help you lower your blood pressure.  Take your blood pressure at home if you are able. Please write down these numbers and bring them to your visits.  If you have any questions please call me   Please return next Wednesday for [place] to check your blood pressure and place a 24 hour monitor on your arm

## 2021-08-01 ENCOUNTER — Other Ambulatory Visit: Payer: Self-pay

## 2021-08-01 ENCOUNTER — Ambulatory Visit (INDEPENDENT_AMBULATORY_CARE_PROVIDER_SITE_OTHER): Payer: Medicare Other | Admitting: Pharmacist

## 2021-08-01 DIAGNOSIS — I10 Essential (primary) hypertension: Secondary | ICD-10-CM | POA: Diagnosis not present

## 2021-08-01 NOTE — Progress Notes (Signed)
Mp   S:    Patient arrives in good spirits. Presents to the clinic for ambulatory blood pressure evaluation.   Patient was referred and last seen by Primary Care Provider on 07/18/21. Patient was last seen by Primary Care Provider on 07/10/21. Patient presented for BP check in pharmacy clinic on 07/26/21 and states he believes he has white-coat hypertension  Medication compliance is reported to be optimal.  Discussed procedure for wearing the monitor and gave patient written instructions. Monitor was placed on non-dominant arm with instructions to return in the morning.   Current BP Medications include:  amlodipine 10mg , lisinopril 40mg   O:  Physical Exam Neurological:     Mental Status: He is alert and oriented to person, place, and time.    Review of Systems  Neurological:  Negative for dizziness and headaches.   Last 3 Office BP readings: BP Readings from Last 3 Encounters:  07/26/21 (!) 152/72  07/18/21 (!) 150/81  07/10/21 (!) 158/72   Basic Metabolic Panel    Component Value Date/Time   NA 143 07/18/2021 0914   K 4.4 07/18/2021 0914   CL 106 07/18/2021 0914   CO2 24 07/18/2021 0914   GLUCOSE 120 (H) 07/18/2021 0914   GLUCOSE 150 (H) 09/04/2016 1054   BUN 20 07/18/2021 0914   CREATININE 1.37 (H) 07/18/2021 0914   CREATININE 1.34 (H) 09/04/2016 1054   CALCIUM 9.4 07/18/2021 0914   GFRNONAA 52 (L) 03/27/2020 1044   GFRAA 60 03/27/2020 1044    Renal function: CrCl cannot be calculated (Unknown ideal weight.).  Today's Office Blood Pressure (BP) reading: 159/92 mmHg (manual reading)  ABPM Study Data: Arm Placement left arm  Overall Mean 24hr BP:   143/78 mmHg HR: 73  Daytime Mean BP:  151/83 mmHg HR: 78  Nighttime Mean BP:  129/67 mmHg HR: 65 Dipping Pattern: Yes.    Sys:   15   Dia: 19.3   [normal dipping ~10-20%]  Non-hypertensive ABPM thresholds: daytime BP <125/75 mmHg, sleeptime BP <120/70 mmHg    A/P: Longstanding history of hypertension with  patient found to have significant nighttime dip in blood pressure. Given 24-hour ambulatory blood pressure demonstrates an average blood pressure of 143/78 mmHg, and a nocturnal dipping pattern that is normal. Will not make any medication adjustments at this time due to patient's risk of symptomatic hypotension as a result of age and dipping pattern.   Results reviewed and written information provided.  Total time in face-to-face counseling 30 minutes.    Patient seen with 05/28/2020, PharmD Candidate and 05/28/2020, PharmD, PGY2 Ambulatory Care Resident

## 2021-08-01 NOTE — Patient Instructions (Addendum)
[name] it was a pleasure seeing you today.   Your blood pressure today was at goal  Continue taking blood pressure medications as prescribed. When you pick up your new prescription you will take one tablet and stop taking two tablets of your current lisinopril  Limiting salt intake and caffeine.  Exercising as able for at least 30 minutes for 5 days out of the week, can also help you lower your blood pressure.  Take your blood pressure at home if you are able. Please write down these numbers and bring them to your visits.  If you have any questions please call me

## 2021-08-02 ENCOUNTER — Other Ambulatory Visit: Payer: Self-pay

## 2021-08-02 ENCOUNTER — Ambulatory Visit: Payer: Medicare Other | Admitting: Pharmacist

## 2021-08-02 DIAGNOSIS — I10 Essential (primary) hypertension: Secondary | ICD-10-CM | POA: Diagnosis not present

## 2021-08-02 MED ORDER — LISINOPRIL 40 MG PO TABS: 40.0000 mg | ORAL_TABLET | Freq: Every day | ORAL | 0 refills | Status: DC

## 2021-08-02 NOTE — Assessment & Plan Note (Signed)
Longstanding history of hypertension with patient found to have significant nighttime dip in blood pressure. Given 24-hour ambulatory blood pressure demonstrates an average blood pressure of 143/78 mmHg, and a nocturnal dipping pattern that is normal. Will not make any medication adjustments at this time due to patient's risk of symptomatic hypotension as a result of age and dipping pattern.

## 2021-08-03 LAB — BASIC METABOLIC PANEL
BUN/Creatinine Ratio: 13 (ref 10–24)
BUN: 18 mg/dL (ref 8–27)
CO2: 25 mmol/L (ref 20–29)
Calcium: 9.4 mg/dL (ref 8.6–10.2)
Chloride: 107 mmol/L — ABNORMAL HIGH (ref 96–106)
Creatinine, Ser: 1.43 mg/dL — ABNORMAL HIGH (ref 0.76–1.27)
Glucose: 126 mg/dL — ABNORMAL HIGH (ref 70–99)
Potassium: 4.5 mmol/L (ref 3.5–5.2)
Sodium: 147 mmol/L — ABNORMAL HIGH (ref 134–144)
eGFR: 50 mL/min/{1.73_m2} — ABNORMAL LOW (ref >59)

## 2021-08-08 ENCOUNTER — Encounter: Payer: Self-pay | Admitting: Student

## 2021-09-11 ENCOUNTER — Encounter: Payer: Medicare Other | Admitting: Internal Medicine

## 2021-09-11 NOTE — Progress Notes (Incomplete)
Patient Care Team: Levin Erp, MD as PCP - General (Family Medicine) Duke Salvia, MD as PCP - Electrophysiology (Cardiology)   HPI  Robert Flynn is a 75 y.o. male With Chief Complaint of f  pacer implanted 11/13 for symptomatic 2:1-3:1 Heart block;  he now has complete heart block.    Date Cr K LDL  7/16 1.54 3.6   6/17 1.6 3.7   10/18 1.27 4.1 94   DATE TEST    10/13 Echo    EF 60+65 %   11/13 .myoview   EF 62 % No ischemia Personally reviewed          Working part-time.  Denies chest pain or edema.  If he climbs a couple flights of stairs back and forth he notes some dyspnea.  This is unchanged.  Past Medical History:  Diagnosis Date   ADJUSTMENT DISORDER WITHOUT DEPRESSED MOOD    ANXIETY    DVT (deep venous thrombosis) (HCC)    of the right arm post pacemaker; on Xarelto for 3 months   EXTERNAL HEMORRHOIDS    Gout    Hypertension    OBESITY, NOS    OSTEOARTHRITIS, MULTI SITES    Pacemaker- [employer]    DOI 11/13   Right bundle branch block and left anterior fascicular block Dec 2013   Progressive heart block with exercise>>3:1; s/p PTVP   Sinus bradycardia     Past Surgical History:  Procedure Laterality Date   PACEMAKER INSERTION  December 2013   PERMANENT PACEMAKER INSERTION N/A 08/03/2012   Procedure: PERMANENT PACEMAKER INSERTION;  Surgeon: Duke Salvia, MD;  Location: [facility] CATH LAB;  Service: Cardiovascular;  Laterality: N/A;    Current Outpatient Medications  Medication Sig Dispense Refill   amLODipine (NORVASC) 10 MG tablet Take 1 tablet (10 mg total) by mouth daily. 90 tablet 3   aspirin 81 MG tablet Take 81 mg by mouth daily.       Colchicine (MITIGARE) 0.6 MG CAPS Take 0.6 mg by mouth daily. For gout flares, take 2 capsules followed by 1 capsule and hour later. After that, take 1 capsule daily until the flare resolves. 30 capsule 0   lisinopril (ZESTRIL) 40 MG tablet Take 1 tablet (40 mg total) by mouth daily. 30 tablet 0    simvastatin (ZOCOR) 40 MG tablet TAKE 1 TABLET (40 MG TOTAL) BY MOUTH AT BEDTIME. 90 tablet 3   tadalafil (CIALIS) 20 MG tablet TAKE 1/2-1 TABLET BY MOUTH EVERY OTHER DAY AS NEEDED FOR ERECTILE DYSFUNCTION 10 tablet 0   No current facility-administered medications for this visit.    No Known Allergies  Review of Systems negative except from HPI and PMH  Physical Exam There were no vitals taken for this visit. Well developed and nourished in no acute distress HENT normal Neck supple with JVP-flat Clear Regular rate and rhythm, no murmurs or gallops Abd-soft with active BS No Clubbing cyanosis edema Skin-warm and dry A & Oriented  Grossly normal sensory and motor function    ECG demonstrates sinus @ 82 with P-synchronous/ AV  pacing    Assessment and  Plan Complete heart block stable post pacing  Pacemaker [employer] The patient's device was interrogated.  The information was reviewed. VIP was inactivated  Hypertension      PVCs      Blood pressure seems reasonably controlled.  He did not take his medications today.  Normal electrolytes and renal function on his ACE/diuretic  No symptoms to suggest cardiomyopathy, however, with 5 years of interval pacing, there is about a 15% chance of pacemaker cardiomyopathy.  We will check an echo.  We spent more than 50% of our >25 min visit in face to face counseling regarding the above    [employer] as a scribe for [name], MD.,have documented all relevant documentation on the behalf of [name], MD,as directed by  [name], MD while in the presence of [name], MD.   ***

## 2021-09-13 NOTE — Progress Notes (Deleted)
° ° °  SUBJECTIVE:   CHIEF COMPLAINT / HPI: BP follow up  Hypertension: Patient is a 75 y.o. male who present today for follow up of hypertension.   Patient endorses {[id number]:24882}. Denies any lightheadedness, dizziness, chest pain, shortness of breath or headaches ***  Home medications include: amlodipine 10 mg daily, and lisinopril 40 mg daily Patient endorses taking these medications as prescribed.***  Most recent creatinine trend:  Lab Results  Component Value Date   CREATININE 1.43 (H) 08/02/2021   CREATININE 1.37 (H) 07/18/2021   CREATININE 1.33 (H) 03/27/2020   Patient {rwdoesdoesnot:[id number]} check blood pressure at home. Was seen in pharmacy clinic and 24 hour BP showed nightime mean BP of 129/67 and daytime mean BP of 151/83 with mean 24 hour BP of 143/78. Given nocturnal dipping/age no medication adjustments were made at that time.   Patient has had a BMP in the past 1 year.  PERTINENT  PMH / PSH: gout,   OBJECTIVE:   There were no vitals taken for this visit.  General: Well appearing, NAD, awake, alert, responsive to questions Head: Normocephalic atraumatic CV: Regular rate and rhythm no murmurs rubs or gallops Respiratory: Clear to ausculation bilaterally, no wheezes rales or crackles, chest rises symmetrically,  no increased work of breathing Abdomen: Soft, non-tender, non-distended, normoactive bowel sounds  Extremities: Moves upper and lower extremities freely, no edema in LE Neuro: No focal deficits Skin: No rashes or lesions visualized   ASSESSMENT/PLAN:   No problem-specific Assessment & Plan notes found for this encounter.     Levin Erp, MD [facility]

## 2021-09-14 ENCOUNTER — Ambulatory Visit: Payer: Medicare Other | Admitting: Student

## 2021-09-14 ENCOUNTER — Encounter: Payer: Self-pay | Admitting: Internal Medicine

## 2021-09-14 ENCOUNTER — Other Ambulatory Visit: Payer: Self-pay

## 2021-09-14 ENCOUNTER — Ambulatory Visit (INDEPENDENT_AMBULATORY_CARE_PROVIDER_SITE_OTHER): Payer: Medicare Other | Admitting: Internal Medicine

## 2021-09-14 VITALS — BP 144/82 | HR 74 | Ht 69.5 in | Wt 198.8 lb

## 2021-09-14 DIAGNOSIS — I442 Atrioventricular block, complete: Secondary | ICD-10-CM

## 2021-09-14 DIAGNOSIS — I509 Heart failure, unspecified: Secondary | ICD-10-CM

## 2021-09-14 DIAGNOSIS — R0609 Other forms of dyspnea: Secondary | ICD-10-CM

## 2021-09-14 DIAGNOSIS — Z950 Presence of cardiac pacemaker: Secondary | ICD-10-CM

## 2021-09-14 MED ORDER — LISINOPRIL-HYDROCHLOROTHIAZIDE 20-25 MG PO TABS: 1.0000 | ORAL_TABLET | Freq: Every day | ORAL | 3 refills | Status: DC

## 2021-09-14 NOTE — Progress Notes (Signed)
Patient Care Team: Levin Erp, MD as PCP - General (Family Medicine) Duke Salvia, MD as PCP - Electrophysiology (Cardiology)   HPI  Robert Flynn is a 75 y.o. male seen in follow-up for pacemaker Apixoban implanted 11/13 for symptomatic 2:1-3:1 heart block;  now with complete heart block. Device at [facility]   Also hypertension  Date Cr K LDL  7/16 1.54 3.6   6/17 1.6 3.7   10/18 1.27 4.1 94  11/22 1.37 >> 1.43 4.4 >> 4.5 117 (8/20)   DATE TEST    10/13 Echo    EF 60+65 %   11/13 .myoview   EF 62 % No ischemia     1/19 Echo 50-55 %   7/19 Echo 50-55 %    Overall he is feeling fine. Occasionally he will go to [employer] to check his blood pressure. Those readings are also typically higher.  While walking his breathing is good. However, he will become short of breath when climbing multiple flights of stairs. Generally no issues with one flight of stairs.  The patient denies chest pain, nocturnal dyspnea, orthopnea or peripheral edema.  There have been no palpitations, lightheadedness or syncope.   Blood pressures at [facility] have been in the 140-50 range with admonitions off the machine to go see her doctor   Past Medical History:  Diagnosis Date   ADJUSTMENT DISORDER WITHOUT DEPRESSED MOOD    ANXIETY    DVT (deep venous thrombosis) (HCC)    of the right arm post pacemaker; on Xarelto for 3 months   EXTERNAL HEMORRHOIDS    Gout    Hypertension    OBESITY, NOS    OSTEOARTHRITIS, MULTI SITES    Pacemaker- [employer]    DOI 11/13   Right bundle branch block and left anterior fascicular block Dec 2013   Progressive heart block with exercise>>3:1; s/p PTVP   Sinus bradycardia     Past Surgical History:  Procedure Laterality Date   PACEMAKER INSERTION  December 2013   PERMANENT PACEMAKER INSERTION N/A 08/03/2012   Procedure: PERMANENT PACEMAKER INSERTION;  Surgeon: Duke Salvia, MD;  Location: [facility] CATH LAB;  Service: Cardiovascular;  Laterality: N/A;    Current  Outpatient Medications  Medication Sig Dispense Refill   amLODipine (NORVASC) 10 MG tablet Take 1 tablet (10 mg total) by mouth daily. 90 tablet 3   aspirin 81 MG tablet Take 81 mg by mouth daily.       Colchicine (MITIGARE) 0.6 MG CAPS Take 0.6 mg by mouth daily. For gout flares, take 2 capsules followed by 1 capsule and hour later. After that, take 1 capsule daily until the flare resolves. 30 capsule 0   lisinopril (ZESTRIL) 40 MG tablet Take 1 tablet (40 mg total) by mouth daily. 30 tablet 0   simvastatin (ZOCOR) 40 MG tablet TAKE 1 TABLET (40 MG TOTAL) BY MOUTH AT BEDTIME. 90 tablet 3   tadalafil (CIALIS) 20 MG tablet TAKE 1/2-1 TABLET BY MOUTH EVERY OTHER DAY AS NEEDED FOR ERECTILE DYSFUNCTION 10 tablet 0   No current facility-administered medications for this visit.    No Known Allergies  Review of Systems negative except from HPI and PMH  Physical Exam BP (!) 144/82    Pulse 74    Ht 5' 9.5" (1.765 m)    Wt 198 lb 12.8 oz (90.2 kg)    SpO2 98%    BMI 28.94 kg/m  Well developed and well nourished in no acute distress HENT  normal Neck supple with JVP-flat Clear Device pocket well healed; without hematoma or erythema.  There is no tethering  Regular rate and rhythm, no  gallop No  murmur Abd-soft with active BS No Clubbing cyanosis  edema Skin-warm and dry A & Oriented  Grossly normal sensory and motor function  ECG sinus with P synchronous pacing at 74 Intervals 23/21/47    Assessment and Plan  Complete heart block  with intrin acute sic escape  Pacemaker [employer]    Hypertension      PVCs  Device at [facility].  I reviewed benefits and risks of pacemaker generator replacement including but not limited to infection and lead fracture.  With device dependence, prior to generator replacement we will undertake an echocardiogram given concern of pacemaker associated cardiomyopathy and it has been more than 3 years with the [facility] data suggesting this would be  associated about a 10% likelihood of interval cardiomyopathy  Blood pressure reasonably controlled, continue amlodipine 10 and change his lisinopril from 40 back to 20 and add a diuretic, hydrochlorothiazide 25.       I,Mathew Stumpf,acting as a scribe for [name], MD.,have documented all relevant documentation on the behalf of [name], MD,as directed by  [name], MD while in the presence of [name], MD.   I, [name], MD, have reviewed all documentation for this visit. The documentation on 09/14/21 for the exam, diagnosis, procedures, and orders are all accurate and complete.

## 2021-09-14 NOTE — Patient Instructions (Signed)
Medication Instructions:  ** Stop Lisinopril 40mg    ** Begin Lisinopril-HCTZ 20/25mg  - 1 tablet by mouth daily *If you need a refill on your cardiac medications before your next appointment, please call your pharmacy*   Lab Work: None ordered.  If you have labs (blood work) drawn today and your tests are completely normal, you will receive your results only by: MyChart Message (if you have MyChart) OR A paper copy in the mail If you have any lab test that is abnormal or we need to change your treatment, we will call you to review the results.   Testing/Procedures: Your physician has requested that you have an echocardiogram. Echocardiography is a painless test that uses sound waves to create images of your heart. It provides your doctor with information about the size and shape of your heart and how well your hearts chambers and valves are working. This procedure takes approximately one hour. There are no restrictions for this procedure.   To be scheduled for a generator change   Follow-Up: At [facility], you and your health needs are our priority.  As part of our continuing mission to provide you with exceptional heart care, we have created designated Provider Care Teams.  These Care Teams include your primary Cardiologist (physician) and Advanced Practice Providers (APPs -  Physician Assistants and Nurse Practitioners) who all work together to provide you with the care you need, when you need it.  We recommend signing up for the patient portal called "MyChart".  Sign up information is provided on this After Visit Summary.  MyChart is used to connect with patients for Virtual Visits (Telemedicine).  Patients are able to view lab/test results, encounter notes, upcoming appointments, etc.  Non-urgent messages can be sent to your provider as well.   To learn more about what you can do with MyChart, go to [facility].    Your next appointment:   To be scheduled

## 2021-09-14 NOTE — Patient Instructions (Incomplete)
It was great to see you! Thank you for allowing me to participate in your care!   I recommend that you always bring your medications to each appointment as this makes it easy to ensure we are on the correct medications and helps [place] not miss when refills are needed.  Our plans for today:  - *** -   We are checking some labs today, I will call you if they are abnormal will send you a MyChart message or a letter if they are normal.  If you do not hear about your labs in the next 2 weeks please let us know.***  Take care and seek immediate care sooner if you develop any concerns. Please remember to show up 15 minutes before your scheduled appointment time!  Levin Erp, MD [facility]

## 2021-09-20 ENCOUNTER — Ambulatory Visit (INDEPENDENT_AMBULATORY_CARE_PROVIDER_SITE_OTHER): Payer: Medicare Other

## 2021-09-20 DIAGNOSIS — I442 Atrioventricular block, complete: Secondary | ICD-10-CM

## 2021-09-20 LAB — CUP PACEART REMOTE DEVICE CHECK
Battery Remaining Longevity: 1 mo
Battery Remaining Percentage: 0.5 %
Battery Voltage: 2.6 V
Brady Statistic AP VP Percent: 8.1 %
Brady Statistic AP VS Percent: 0 %
Brady Statistic AS VP Percent: 92 %
Brady Statistic AS VS Percent: 1 %
Brady Statistic RA Percent Paced: 8.1 %
Brady Statistic RV Percent Paced: 99 %
Date Time Interrogation Session: 20230105081313
Implantable Lead Implant Date: 20131118
Implantable Lead Implant Date: 20131118
Implantable Lead Location: 753859
Implantable Lead Location: 753860
Implantable Pulse Generator Implant Date: 20131118
Lead Channel Impedance Value: 350 Ohm
Lead Channel Impedance Value: 430 Ohm
Lead Channel Pacing Threshold Amplitude: 0.75 V
Lead Channel Pacing Threshold Amplitude: 0.75 V
Lead Channel Pacing Threshold Pulse Width: 0.4 ms
Lead Channel Pacing Threshold Pulse Width: 0.4 ms
Lead Channel Sensing Intrinsic Amplitude: 11.5 mV
Lead Channel Sensing Intrinsic Amplitude: 4.8 mV
Lead Channel Setting Pacing Amplitude: 2 V
Lead Channel Setting Pacing Amplitude: 2.5 V
Lead Channel Setting Pacing Pulse Width: 0.4 ms
Lead Channel Setting Sensing Sensitivity: 2 mV
Pulse Gen Model: 2110
Pulse Gen Serial Number: 7362469

## 2021-09-21 ENCOUNTER — Ambulatory Visit (HOSPITAL_COMMUNITY): Payer: Medicare Other | Attending: Internal Medicine

## 2021-09-21 ENCOUNTER — Other Ambulatory Visit: Payer: Self-pay

## 2021-09-21 DIAGNOSIS — I509 Heart failure, unspecified: Secondary | ICD-10-CM | POA: Insufficient documentation

## 2021-09-21 DIAGNOSIS — R0609 Other forms of dyspnea: Secondary | ICD-10-CM | POA: Diagnosis not present

## 2021-09-21 LAB — ECHOCARDIOGRAM COMPLETE: S' Lateral: 2.9 cm

## 2021-10-01 NOTE — Progress Notes (Signed)
Remote pacemaker transmission.   

## 2021-10-11 ENCOUNTER — Telehealth: Payer: Self-pay

## 2021-10-11 NOTE — Telephone Encounter (Signed)
Attempted phone call to pt's home and mobile phone number.  Voicemail has not been set up.

## 2021-10-11 NOTE — Telephone Encounter (Signed)
-----   Message from Deboraha Sprang, MD sent at 10/11/2021  9:31 AM EST ----- Please Inform Patient Echo showed mild interval worsening of  heart muscle function      His device is at [facility] and we should anticpate adding a new lead to protect fro m what may be LV function worsening as a consequence of his pacing   He needs to be scheduled for device generator replacment  Thanks SK

## 2021-10-18 NOTE — Telephone Encounter (Signed)
I called all 3 numbers listed in pt's chart and didn't get an answer. I will try again tomorrow.

## 2021-10-19 ENCOUNTER — Other Ambulatory Visit: Payer: Self-pay | Admitting: Internal Medicine

## 2021-10-23 NOTE — Telephone Encounter (Signed)
Attempted phone call to pt's home and mobile phone numbers.  Unable to leave voicemail as it has not been set up.  Attempted phone call to pt's spouse number and message says unable to complete call at this time. Have also sent MyChart message however pt's MyChart has not been logged into since 03/15/2020.  Letter mailed to pt re; need to contact office.  See letter for complete details.

## 2021-10-26 ENCOUNTER — Telehealth: Payer: Self-pay | Admitting: Internal Medicine

## 2021-10-26 NOTE — Telephone Encounter (Signed)
Attempted to reach pt, no answer, unable to leave a message

## 2021-10-26 NOTE — Telephone Encounter (Signed)
Follow Up:      Patient is calling to get scheduled for his pacemaker generator change.

## 2021-10-30 NOTE — Telephone Encounter (Signed)
Attempted phone call to pt.  Unable to leave voicemail message.

## 2021-10-30 NOTE — Telephone Encounter (Signed)
Attempted phone call to pt.  Unable to leave voicemail message. °

## 2021-11-02 ENCOUNTER — Other Ambulatory Visit: Payer: Self-pay

## 2021-11-02 DIAGNOSIS — I509 Heart failure, unspecified: Secondary | ICD-10-CM

## 2021-11-02 DIAGNOSIS — I442 Atrioventricular block, complete: Secondary | ICD-10-CM

## 2021-11-02 DIAGNOSIS — Z950 Presence of cardiac pacemaker: Secondary | ICD-10-CM

## 2021-11-02 DIAGNOSIS — Z01812 Encounter for preprocedural laboratory examination: Secondary | ICD-10-CM

## 2021-11-02 NOTE — Telephone Encounter (Signed)
Spoke with pt and advised of need to schedule PPM upgrade per Dr Caryl Comes.  Procedure scheduled for 11/21/2021.  Pt will come for labs on 11/05/21 and will pick up instruction letter on that day.  Instructions reviewed with pt.  See letter for complete details.  Pt was given scrub at his OV in 08/2021 along with instruction sheet.  Pt verbalizes understanding and agrees with current plan.

## 2021-11-02 NOTE — Telephone Encounter (Signed)
Spoke with pt and advised of need to schedule PPM upgrade per Dr Klein.  Procedure scheduled for 11/21/2021.  Pt will come for labs on 11/05/21 and will pick up instruction letter on that day.  Instructions reviewed with pt.  See letter for complete details.  Pt was given scrub at his OV in 08/2021 along with instruction sheet.  Pt verbalizes understanding and agrees with current plan.  °

## 2021-11-02 NOTE — Progress Notes (Signed)
Pre-Procedure labs per Dr Caryl Comes

## 2021-11-05 ENCOUNTER — Other Ambulatory Visit: Payer: Self-pay

## 2021-11-05 ENCOUNTER — Other Ambulatory Visit: Payer: Medicare Other | Admitting: *Deleted

## 2021-11-05 DIAGNOSIS — Z01812 Encounter for preprocedural laboratory examination: Secondary | ICD-10-CM

## 2021-11-05 DIAGNOSIS — Z950 Presence of cardiac pacemaker: Secondary | ICD-10-CM | POA: Diagnosis not present

## 2021-11-05 DIAGNOSIS — I509 Heart failure, unspecified: Secondary | ICD-10-CM

## 2021-11-05 DIAGNOSIS — I442 Atrioventricular block, complete: Secondary | ICD-10-CM | POA: Diagnosis not present

## 2021-11-05 LAB — CBC

## 2021-11-06 LAB — CBC
Hematocrit: 38.2 % (ref 37.5–51.0)
Hemoglobin: 13 g/dL (ref 13.0–17.7)
MCH: 29 pg (ref 26.6–33.0)
MCHC: 34 g/dL (ref 31.5–35.7)
MCV: 85 fL (ref 79–97)
RBC: 4.49 x10E6/uL (ref 4.14–5.80)
RDW: 13.4 % (ref 11.6–15.4)
WBC: 4.7 10*3/uL (ref 3.4–10.8)

## 2021-11-06 LAB — BASIC METABOLIC PANEL
BUN/Creatinine Ratio: 11 (ref 10–24)
BUN: 15 mg/dL (ref 8–27)
CO2: 27 mmol/L (ref 20–29)
Calcium: 8.8 mg/dL (ref 8.6–10.2)
Chloride: 103 mmol/L (ref 96–106)
Creatinine, Ser: 1.39 mg/dL — ABNORMAL HIGH (ref 0.76–1.27)
Glucose: 121 mg/dL — ABNORMAL HIGH (ref 70–99)
Potassium: 3.5 mmol/L (ref 3.5–5.2)
Sodium: 143 mmol/L (ref 134–144)
eGFR: 52 mL/min/{1.73_m2} — ABNORMAL LOW (ref >59)

## 2021-11-20 NOTE — Pre-Procedure Instructions (Signed)
Attempted to call patient regarding procedure instructions.  Voice mail has not been set up. ?

## 2021-11-21 ENCOUNTER — Ambulatory Visit (HOSPITAL_COMMUNITY)
Admission: RE | Admit: 2021-11-21 | Discharge: 2021-11-21 | Disposition: A | Discharge status: Home / Self Care | Payer: Medicare Other | Attending: Internal Medicine | Admitting: Internal Medicine

## 2021-11-21 ENCOUNTER — Ambulatory Visit (HOSPITAL_COMMUNITY)
Admission: RE | Disposition: A | Discharge status: Home / Self Care | Payer: Medicare Other | Source: Home / Self Care | Attending: Internal Medicine

## 2021-11-21 ENCOUNTER — Other Ambulatory Visit: Payer: Self-pay

## 2021-11-21 ENCOUNTER — Ambulatory Visit (HOSPITAL_COMMUNITY): Payer: Medicare Other

## 2021-11-21 DIAGNOSIS — M109 Gout, unspecified: Secondary | ICD-10-CM | POA: Diagnosis not present

## 2021-11-21 DIAGNOSIS — I452 Bifascicular block: Secondary | ICD-10-CM | POA: Diagnosis not present

## 2021-11-21 DIAGNOSIS — I429 Cardiomyopathy, unspecified: Secondary | ICD-10-CM | POA: Diagnosis not present

## 2021-11-21 DIAGNOSIS — I442 Atrioventricular block, complete: Secondary | ICD-10-CM

## 2021-11-21 DIAGNOSIS — Z950 Presence of cardiac pacemaker: Secondary | ICD-10-CM

## 2021-11-21 DIAGNOSIS — R001 Bradycardia, unspecified: Secondary | ICD-10-CM | POA: Insufficient documentation

## 2021-11-21 DIAGNOSIS — I10 Essential (primary) hypertension: Secondary | ICD-10-CM | POA: Diagnosis not present

## 2021-11-21 DIAGNOSIS — Z959 Presence of cardiac and vascular implant and graft, unspecified: Secondary | ICD-10-CM

## 2021-11-21 DIAGNOSIS — Z86718 Personal history of other venous thrombosis and embolism: Secondary | ICD-10-CM | POA: Diagnosis not present

## 2021-11-21 DIAGNOSIS — Z79899 Other long term (current) drug therapy: Secondary | ICD-10-CM | POA: Insufficient documentation

## 2021-11-21 DIAGNOSIS — Z7982 Long term (current) use of aspirin: Secondary | ICD-10-CM | POA: Insufficient documentation

## 2021-11-21 DIAGNOSIS — Z45010 Encounter for checking and testing of cardiac pacemaker pulse generator [battery]: Secondary | ICD-10-CM

## 2021-11-21 HISTORY — PX: BIV UPGRADE: EP1202

## 2021-11-21 HISTORY — PX: PPM GENERATOR CHANGEOUT: EP1233

## 2021-11-21 SURGERY — PPM GENERATOR CHANGEOUT

## 2021-11-21 MED ORDER — MIDAZOLAM HCL 5 MG/5ML IJ SOLN
INTRAMUSCULAR | Status: DC | PRN
  Administered 2021-11-21 (×3): 1 mg via INTRAVENOUS

## 2021-11-21 MED ORDER — CEFAZOLIN SODIUM-DEXTROSE 2-4 GM/100ML-% IV SOLN
INTRAVENOUS | Status: AC
  Filled 2021-11-21: qty 100

## 2021-11-21 MED ORDER — FENTANYL CITRATE (PF) 100 MCG/2ML IJ SOLN
INTRAMUSCULAR | Status: DC | PRN
  Administered 2021-11-21 (×3): 25 ug via INTRAVENOUS

## 2021-11-21 MED ORDER — LIDOCAINE HCL (PF) 1 % IJ SOLN
INTRAMUSCULAR | Status: DC | PRN
  Administered 2021-11-21: 60 mL

## 2021-11-21 MED ORDER — ACETAMINOPHEN 325 MG PO TABS: 325.0000 mg | ORAL_TABLET | ORAL | Status: DC | PRN

## 2021-11-21 MED ORDER — HEPARIN (PORCINE) IN NACL 1000-0.9 UT/500ML-% IV SOLN
INTRAVENOUS | Status: AC
  Filled 2021-11-21: qty 1000

## 2021-11-21 MED ORDER — HEPARIN (PORCINE) IN NACL 1000-0.9 UT/500ML-% IV SOLN
INTRAVENOUS | Status: DC | PRN
  Administered 2021-11-21: 500 mL

## 2021-11-21 MED ORDER — FENTANYL CITRATE (PF) 100 MCG/2ML IJ SOLN
INTRAMUSCULAR | Status: AC
  Filled 2021-11-21: qty 2

## 2021-11-21 MED ORDER — SODIUM CHLORIDE 0.9 % IV SOLN
INTRAVENOUS | Status: AC
  Filled 2021-11-21: qty 2

## 2021-11-21 MED ORDER — SODIUM CHLORIDE 0.9 % IV SOLN: INTRAVENOUS | Status: DC

## 2021-11-21 MED ORDER — ONDANSETRON HCL 4 MG/2ML IJ SOLN: 4.0000 mg | Freq: Four times a day (QID) | INTRAMUSCULAR | Status: DC | PRN

## 2021-11-21 MED ORDER — MIDAZOLAM HCL 5 MG/5ML IJ SOLN
INTRAMUSCULAR | Status: AC
  Filled 2021-11-21: qty 5

## 2021-11-21 MED ORDER — LIDOCAINE HCL (PF) 1 % IJ SOLN
INTRAMUSCULAR | Status: AC
  Filled 2021-11-21: qty 90

## 2021-11-21 MED ORDER — IOHEXOL 350 MG/ML SOLN
INTRAVENOUS | Status: DC | PRN
Start: 2021-11-21 — End: 2021-11-21
  Administered 2021-11-21: 40 mL via INTRAVENOUS

## 2021-11-21 MED ORDER — CEFAZOLIN SODIUM-DEXTROSE 2-4 GM/100ML-% IV SOLN
2.0000 g | INTRAVENOUS | Status: AC
  Administered 2021-11-21: 2 g via INTRAVENOUS

## 2021-11-21 MED ORDER — SODIUM CHLORIDE 0.9 % IV SOLN
80.0000 mg | INTRAVENOUS | Status: AC
  Administered 2021-11-21: 80 mg

## 2021-11-21 SURGICAL SUPPLY — 18 items
BALLN COR SINUS VENO 6FR 80 (BALLOONS) ×2
BALLOON COR SINUS VENO 6FR 80 (BALLOONS) IMPLANT
CABLE SURGICAL S-101-97-12 (CABLE) ×2 IMPLANT
CATH CPS DIRECT 135 DS2C020 (CATHETERS) ×1 IMPLANT
HEMOSTAT SURGICEL 2X4 FIBR (HEMOSTASIS) ×1 IMPLANT
KIT MICROPUNCTURE NIT STIFF (SHEATH) ×1 IMPLANT
LEAD QUARTET 1456Q-86 (Lead) IMPLANT
PACEMAKER QUDR ALLR CRT PM3562 (Pacemaker) IMPLANT
PAD DEFIB RADIO PHYSIO CONN (PAD) ×2 IMPLANT
PMKR QUADRA ALLURE CRT PM3562 (Pacemaker) ×2 IMPLANT
QUARTET 1456Q-86 (Lead) ×2 IMPLANT
SHEATH 9.5FR PRELUDE SNAP 13 (SHEATH) ×1 IMPLANT
SHEATH PROBE COVER 6X72 (BAG) ×1 IMPLANT
SLITTER UNIVERSAL DS2A003 (MISCELLANEOUS) ×1 IMPLANT
TRAY PACEMAKER INSERTION (PACKS) ×2 IMPLANT
WIRE ACUITY WHISPER EDS 4648 (WIRE) ×2 IMPLANT
WIRE HI TORQ VERSACORE-J 145CM (WIRE) ×1 IMPLANT
WIRE HI TORQ WHISPER MS 190CM (WIRE) ×1 IMPLANT

## 2021-11-21 NOTE — Discharge Instructions (Signed)
After Your Lead Revision ? ?Do not lift your arm above shoulder height for 1 week after your procedure. After 7 days, you may progress as below.  ?You should remove your sling 24 hours after your procedure, unless otherwise instructed by your provider.  ? ? ? Wednesday November 28, 2021  Thursday November 29, 2021 Friday November 30, 2021 Saturday December 01, 2021  ? ?Do not lift, push, pull, or carry anything over 10 pounds with the affected arm until 6 weeks (Wednesday January 02, 2022) after your procedure.  ? ?Do not drive until your wound check or until instructed by your healthcare provider that you are safe to do so.  ? ?Monitor your surgical site for redness, swelling, and drainage. Call the device clinic at [phone] if you experience these symptoms or fever/chills. ? ?If your incision is sealed with Steri-strips or staples. You may shower 7 days after your procedure and wash your incision with soap and water as long as it is healed. If your incision is closed with Dermabond/Surgical glue. You may shower 1 day after your pacemaker implant and wash your incision with soap and water. Avoid lotions, ointments, or perfumes over your incision until it is well-healed. ? ?You may use a hot tub or a pool AFTER your wound check appointment if the incision is completely closed. ? ? ?Your cardiac device may be MRI compatible. We will discuss this at your office visit/Wound check ? ?Remote monitoring is used to monitor your cardiac device from home. This monitoring is scheduled every 91 days by our office. It allows [place] to keep an eye on the functioning of your device to ensure it is working properly. You will routinely see your Electrophysiologist annually (more often if necessary).  ? ?Implantable Cardiac Device Lead Replacement, Care After ?This sheet gives you information about how to care for yourself after your procedure. Your health care provider may also give you more specific instructions. If you have problems or  questions, contact your health care provider. ?What can I expect after the procedure? ?After your procedure, it is common to have: ?Mild discomfort at the incision site. ?A small amount of drainage or bleeding at the incision site. This is usually no more than a spot. ?Follow these instructions at home: ?Incision care ?  ?  ? ?  ?Follow instructions from your health care provider about how to take care of your incision. Make sure you: ?Leave stitches (sutures), skin glue, or adhesive strips in place. These skin closures may need to stay in place for 2 weeks or longer. If adhesive strip edges start to loosen and curl up, you may trim the loose edges. Do not remove adhesive strips completely unless your health care provider tells you to do that. ?Check your incision area every day for signs of infection. Check for: ?More redness, swelling, or pain. ?More fluid or blood. ?Warmth. ?Pus or a bad smell. ?Electric and magnetic fields ?Digital cell phones should be kept 12 inches (30 cm) away from the cardiac device when they are on. When talking on a cell phone, use the ear on the opposite side of your cardiac device. ?Do not place a cell phone in a pocket next to the cardiac device. ?Household appliances do not interfere with modern-day cardiac device. ?Medicines ?Take over-the-counter and prescription medicines only as told by your health care provider. ?General instructions ?Do not raise the arm on the side of your procedure higher than your shoulder for at least 7 days. Except  for this restriction, continue to use your arm as normal to prevent problems. ?Do not take baths, swim, or use a hot tub until your health care provider says it is okay to do so. You may shower as directed by your health care provider. ?Do not lift anything that is heavier than 10 lb (4.5 kg) for 6 weeks or the limit that your health care provider tells you, until he or she says that it is safe. ?Return to your normal activities after 2 weeks, or  as told by your health care provider. Ask your health care provider what activities are safe for you. ?Keep all follow-up visits as told by your health care provider. This is important. ?Contact a health care provider if: ?You have more redness, swelling, or pain around your incision. ?You have more fluid or blood coming from your incision. ?Your incision feels warm to the touch. ?You have pus or a bad smell coming from your incision. ?You have a fever. ?The arm or hand on the side of the cardiac device becomes swollen. ?The symptoms you had before your procedure are not getting better. ?Get help right away if: ?You develop chest pain. ?You feel like you will faint. ?You feel light-headed. ?You faint. ?Summary ?Check your incision area every day for signs of infection, such as more fluid or blood. A small amount of drainage or bleeding at the incision site is normal. ?Do not raise the arm on the side of your procedure higher than your shoulder for at least 5 days, or as long as directed by your health care provider. ?Digital cell phones should be kept 12 inches (30 cm) away from the cardiac device when they are on. When talking on a cell phone, use the ear on the opposite side of your cardiac device. ?If the symptoms that led to having your lead replaced are not getting better, contact your health care provider. ?This information is not intended to replace advice given to you by your health care provider. Make sure you discuss any questions you have with your health care provider.  ?

## 2021-11-21 NOTE — H&P (Addendum)
? ? ? ? ?Patient Care Team: ?Levin Erp, MD as PCP - General (Family Medicine) ?Duke Salvia, MD as PCP - Electrophysiology (Cardiology) ? ? ?HPI ? ?Robert Flynn is a 76 y.o. maleadmitted  for pacemaker replacment implanted 11/13 for symptomatic 2:1-3:1 heart block>>> complete heart block. Device at [facility]   ?  ?Date Cr K LDL  ?7/16 1.54 3.6    ?6/17 1.6 3.7    ?10/18 1.27 4.1 94  ?11/22 1.37 >> 1.43 4.4 >> 4.5 117 (8/20)  ?  ?DATE TEST      ?10/13 Echo    EF 60+65 %    ?11/13 .myoview   EF 62 % No ischemia     ?1/19 Echo 50-55 %    ?7/19 Echo 50-55 %    ?2/23 Echo 40-45%   ?  ?The patient denies chest pain, shortness of breath, nocturnal dyspnea, orthopnea or peripheral edema.  There have been no palpitations, lightheadedness or syncope.   ? ?Records and Results Reviewed  ? ?Past Medical History:  ?Diagnosis Date  ? ADJUSTMENT DISORDER WITHOUT DEPRESSED MOOD   ? ANXIETY   ? DVT (deep venous thrombosis) (HCC)   ? of the right arm post pacemaker; on Xarelto for 3 months  ? EXTERNAL HEMORRHOIDS   ? Gout   ? Hypertension   ? OBESITY, NOS   ? OSTEOARTHRITIS, MULTI SITES   ? Pacemaker- [employer]   ? DOI 11/13  ? Right bundle branch block and left anterior fascicular block Dec 2013  ? Progressive heart block with exercise>>3:1; s/p PTVP  ? Sinus bradycardia   ? ? ?Past Surgical History:  ?Procedure Laterality Date  ? PACEMAKER INSERTION  December 2013  ? PERMANENT PACEMAKER INSERTION N/A 08/03/2012  ? Procedure: PERMANENT PACEMAKER INSERTION;  Surgeon: Duke Salvia, MD;  Location: [facility] CATH LAB;  Service: Cardiovascular;  Laterality: N/A;  ? ? ?Current Facility-Administered Medications  ?Medication Dose Route Frequency Provider Last Rate Last Admin  ? 0.9 %  sodium chloride infusion   Intravenous Continuous Duke Salvia, MD 50 mL/hr at 11/21/21 0914 New Bag at 11/21/21 0914  ? ceFAZolin (ANCEF) IVPB 2g/100 mL premix  2 g Intravenous On Call Duke Salvia, MD      ? gentamicin (GARAMYCIN) 80 mg in sodium  chloride 0.9 % 500 mL irrigation  80 mg Irrigation On Call Duke Salvia, MD      ? ? ?No Known Allergies ? ?  ?Social History  ? ?Tobacco Use  ? Smoking status: Never  ? Smokeless tobacco: Never  ?Substance Use Topics  ? Alcohol use: Yes  ?  Alcohol/week: 2.0 standard drinks  ?  Types: 2 drink(s) per week  ? Drug use: No  ?  ? ?Family History  ?Problem Relation Age of Onset  ? Heart disease Brother   ?  ? ?Current Meds  ?Medication Sig  ? amLODipine (NORVASC) 10 MG tablet Take 1 tablet (10 mg total) by mouth daily.  ? aspirin 81 MG tablet Take 81 mg by mouth daily.    ? Colchicine (MITIGARE) 0.6 MG CAPS Take 0.6 mg by mouth daily. For gout flares, take 2 capsules followed by 1 capsule and hour later. After that, take 1 capsule daily until the flare resolves. (Patient taking differently: Take 0.6 mg by mouth See admin instructions. For gout flares, take 2 capsules followed by 1 capsule and hour later. After that, take 1 capsule daily until the flare resolves. As needed)  ?  lisinopril-hydrochlorothiazide (ZESTORETIC) 20-25 MG tablet Take 1 tablet by mouth daily.  ? Multiple Vitamins-Minerals (MULTIVITAMIN WITH MINERALS) tablet Take 1 tablet by mouth daily.  ? simvastatin (ZOCOR) 40 MG tablet TAKE 1 TABLET (40 MG TOTAL) BY MOUTH AT BEDTIME.  ? tadalafil (CIALIS) 20 MG tablet TAKE 1/2-1 TABLET BY MOUTH EVERY OTHER DAY AS NEEDED FOR ERECTILE DYSFUNCTION  ? ? ? ?Review of Systems negative except from HPI and PMH ? ?Physical Exam ?BP (!) 171/93   Pulse 79   Temp 98.5 ?F (36.9 ?C) (Oral)   Resp 17   Ht 5' 9.5" (1.765 m)   Wt 89.8 kg   SpO2 100%   BMI 28.82 kg/m?  ?Well developed and well nourished in no acute distress ?HENT normal ?E scleral and icterus clear ?Neck Supple ?Clear to ausculation ?Device pocket well healed; without hematoma or erythema.  There is no tethering  ?Regular rate and rhythm, no murmurs gallops or rub ?Soft   ?No clubbing cyanosis  Edema ?Alert and oriented, grossly normal motor and sensory  function ?Skin Warm and Dry ? ? ? ?Assessment and  Plan ?Complete heart block  with intrinsic escape ?  ?Pacemaker [employer]   ?  ?Hypertension     ?  ?Cardiomyopathy-- new  presumed pacemaker mediated and nonischemic ? ?PVCs ? ?Generator at [employer]-- with interval worsening of LV function presumed 2/2 pacing with CHB will add LV lead to try and reverse.  If doesnot will need further evaluation later. Now largely without symptoms ? ?The benefits and risks were reviewed including but not limited to death,  perforation, infection, lead dislodgement and device malfunction.  The patient understands agrees and is willing to proceed. ? ? ? ? ?

## 2021-11-22 ENCOUNTER — Encounter (HOSPITAL_COMMUNITY): Payer: Self-pay | Admitting: Internal Medicine

## 2021-11-22 MED FILL — Heparin Sod (Porcine)-NaCl IV Soln 1000 Unit/500ML-0.9%: INTRAVENOUS | Qty: 500 | Status: AC

## 2021-12-05 ENCOUNTER — Other Ambulatory Visit: Payer: Self-pay

## 2021-12-05 ENCOUNTER — Ambulatory Visit (INDEPENDENT_AMBULATORY_CARE_PROVIDER_SITE_OTHER): Payer: Medicare Other

## 2021-12-05 DIAGNOSIS — I442 Atrioventricular block, complete: Secondary | ICD-10-CM

## 2021-12-05 LAB — CUP PACEART INCLINIC DEVICE CHECK
Battery Remaining Longevity: 63 mo
Battery Voltage: 3.08 V
Brady Statistic RA Percent Paced: 20 %
Brady Statistic RV Percent Paced: 99.99 %
Date Time Interrogation Session: 20230322133047
Implantable Lead Implant Date: 20131118
Implantable Lead Implant Date: 20131118
Implantable Lead Implant Date: 20230308
Implantable Lead Location: 753858
Implantable Lead Location: 753859
Implantable Lead Location: 753860
Implantable Pulse Generator Implant Date: 20230308
Lead Channel Impedance Value: 362.5 Ohm
Lead Channel Impedance Value: 462.5 Ohm
Lead Channel Impedance Value: 550 Ohm
Lead Channel Pacing Threshold Amplitude: 0.75 V
Lead Channel Pacing Threshold Amplitude: 0.75 V
Lead Channel Pacing Threshold Amplitude: 1.5 V
Lead Channel Pacing Threshold Pulse Width: 0.5 ms
Lead Channel Pacing Threshold Pulse Width: 0.5 ms
Lead Channel Pacing Threshold Pulse Width: 0.5 ms
Lead Channel Sensing Intrinsic Amplitude: 11.9 mV
Lead Channel Sensing Intrinsic Amplitude: 4.7 mV
Lead Channel Setting Pacing Amplitude: 2.5 V
Lead Channel Setting Pacing Amplitude: 2.5 V
Lead Channel Setting Pacing Amplitude: 3.5 V
Lead Channel Setting Pacing Pulse Width: 0.5 ms
Lead Channel Setting Pacing Pulse Width: 0.5 ms
Lead Channel Setting Sensing Sensitivity: 4 mV
Pulse Gen Model: 3562
Pulse Gen Serial Number: 8062726

## 2021-12-05 NOTE — Patient Instructions (Signed)
? ?  After Your Pacemaker ? ? ?Monitor your pacemaker site for redness, swelling, and drainage. Call the device clinic at [phone] if you experience these symptoms or fever/chills. ? ?Your incision was closed with Dermabond:  You may shower and wash your incision with soap and water. Avoid lotions, ointments, or perfumes over your incision until it is well-healed. ? ?You may use a hot tub or a pool after your wound check appointment if the incision is completely closed. ? ?Do not lift, push or pull greater than 10 pounds with the affected arm until 6 weeks after your procedure. There are no other restrictions in arm movement after your wound check appointment. April 19 ? ?You may drive, unless driving has been restricted by your healthcare providers. ? ?Your Pacemaker is MRI compatible. ? ?Remote monitoring is used to monitor your pacemaker from home. This monitoring is scheduled every 91 days by our office. It allows [place] to keep an eye on the functioning of your device to ensure it is working properly. You will routinely see your Electrophysiologist annually (more often if necessary).  ?

## 2021-12-05 NOTE — Progress Notes (Signed)
Wound check appointment s/p PPM upgrade to Bi-V pacer with new LV lead. Steri-strips removed. Wound without redness or edema. Incision edges approximated, wound well healed. Normal device function. Thresholds, sensing, and impedances consistent with implant measurements. Device programmed at 3.5V for LV for extra safety margin until 3 month visit. Histogram distribution appropriate for patient and level of activity. No ventricular arrhythmias noted. 39 AMS for "AT/AF" however episodes appear to be oversensing FF. Discuss with industry. RA sensitivity programmed from 0.5 to 0.75 today.  Patient educated about wound care, arm mobility, lifting restrictions, shock plan. ROV in 3 months with implanting physician. ?

## 2021-12-06 ENCOUNTER — Telehealth: Payer: Self-pay

## 2021-12-06 NOTE — Telephone Encounter (Signed)
Successful telephone encounter to patient to attempt to assist him with activating his new remote monitor. After several attempts he was still unable to connect and send manual transmission. Will have remote specialist give patient a call to troubleshoot and access [facility] remote monitoring assistance.  ?

## 2021-12-08 ENCOUNTER — Other Ambulatory Visit: Payer: Self-pay | Admitting: Student

## 2021-12-08 DIAGNOSIS — N529 Male erectile dysfunction, unspecified: Secondary | ICD-10-CM

## 2021-12-10 NOTE — Telephone Encounter (Signed)
Transmission received. Monitor is now working properly. ?

## 2021-12-11 ENCOUNTER — Other Ambulatory Visit: Payer: Self-pay | Admitting: Student

## 2021-12-11 DIAGNOSIS — N529 Male erectile dysfunction, unspecified: Secondary | ICD-10-CM

## 2022-01-09 ENCOUNTER — Other Ambulatory Visit: Payer: Self-pay | Admitting: Student

## 2022-01-09 DIAGNOSIS — N529 Male erectile dysfunction, unspecified: Secondary | ICD-10-CM

## 2022-02-12 ENCOUNTER — Other Ambulatory Visit: Payer: Self-pay | Admitting: Student

## 2022-02-12 DIAGNOSIS — N529 Male erectile dysfunction, unspecified: Secondary | ICD-10-CM

## 2022-02-15 ENCOUNTER — Other Ambulatory Visit: Payer: Self-pay | Admitting: Student

## 2022-02-15 DIAGNOSIS — N529 Male erectile dysfunction, unspecified: Secondary | ICD-10-CM

## 2022-02-18 DIAGNOSIS — I493 Ventricular premature depolarization: Secondary | ICD-10-CM | POA: Insufficient documentation

## 2022-02-19 ENCOUNTER — Encounter: Payer: Self-pay | Admitting: *Deleted

## 2022-02-21 ENCOUNTER — Ambulatory Visit (INDEPENDENT_AMBULATORY_CARE_PROVIDER_SITE_OTHER): Payer: Medicare Other

## 2022-02-21 DIAGNOSIS — Z950 Presence of cardiac pacemaker: Secondary | ICD-10-CM | POA: Diagnosis not present

## 2022-02-21 LAB — CUP PACEART REMOTE DEVICE CHECK
Battery Remaining Longevity: 67 mo
Battery Remaining Percentage: 95.5 %
Battery Voltage: 2.99 V
Brady Statistic AP VP Percent: 28 %
Brady Statistic AP VS Percent: 1 %
Brady Statistic AS VP Percent: 72 %
Brady Statistic AS VS Percent: 1 %
Brady Statistic RA Percent Paced: 28 %
Date Time Interrogation Session: 20230608020007
Implantable Lead Implant Date: 20131118
Implantable Lead Implant Date: 20131118
Implantable Lead Implant Date: 20230308
Implantable Lead Location: 753858
Implantable Lead Location: 753859
Implantable Lead Location: 753860
Implantable Pulse Generator Implant Date: 20230308
Lead Channel Impedance Value: 340 Ohm
Lead Channel Impedance Value: 410 Ohm
Lead Channel Impedance Value: 760 Ohm
Lead Channel Pacing Threshold Amplitude: 0.75 V
Lead Channel Pacing Threshold Amplitude: 0.75 V
Lead Channel Pacing Threshold Amplitude: 1.5 V
Lead Channel Pacing Threshold Pulse Width: 0.5 ms
Lead Channel Pacing Threshold Pulse Width: 0.5 ms
Lead Channel Pacing Threshold Pulse Width: 0.5 ms
Lead Channel Sensing Intrinsic Amplitude: 11.9 mV
Lead Channel Sensing Intrinsic Amplitude: 3.7 mV
Lead Channel Setting Pacing Amplitude: 2.5 V
Lead Channel Setting Pacing Amplitude: 2.5 V
Lead Channel Setting Pacing Amplitude: 3.5 V
Lead Channel Setting Pacing Pulse Width: 0.5 ms
Lead Channel Setting Pacing Pulse Width: 0.5 ms
Lead Channel Setting Sensing Sensitivity: 4 mV
Pulse Gen Model: 3562
Pulse Gen Serial Number: 8062726

## 2022-02-23 ENCOUNTER — Other Ambulatory Visit: Payer: Self-pay | Admitting: Student

## 2022-02-23 DIAGNOSIS — N529 Male erectile dysfunction, unspecified: Secondary | ICD-10-CM

## 2022-02-26 ENCOUNTER — Encounter: Payer: Self-pay | Admitting: Internal Medicine

## 2022-02-26 ENCOUNTER — Ambulatory Visit: Payer: Medicare Other | Admitting: Internal Medicine

## 2022-02-26 VITALS — BP 126/74 | HR 90 | Ht 69.5 in | Wt 186.6 lb

## 2022-02-26 DIAGNOSIS — I493 Ventricular premature depolarization: Secondary | ICD-10-CM

## 2022-02-26 DIAGNOSIS — I442 Atrioventricular block, complete: Secondary | ICD-10-CM

## 2022-02-26 DIAGNOSIS — Z950 Presence of cardiac pacemaker: Secondary | ICD-10-CM

## 2022-02-26 NOTE — Progress Notes (Signed)
Patient Care Team: Gerrit Heck, MD as PCP - General (Family Medicine) Deboraha Sprang, MD as PCP - Electrophysiology (Cardiology)   HPI  Robert Flynn is a 76 y.o. male With Chief Complaint of  [employer] pacemaker implanted 11/13 for symptomatic 2:1-3:1 Heart block with progression to  complete heart block.  CRT upgrade 3/23   The patient denies chest pain, shortness of breath, nocturnal dyspnea, orthopnea or peripheral edema.  There have been no palpitations, lightheadedness or syncope.  No changes following implant but symptoms were not as impressive as change in EF    Date Cr K LDL  7/16 1.54 3.6    6/17 1.6 3.7    10/18 1.27 4.1 94  11/22 1.37 >> 1.43 4.4 >> 4.5 117 (8/20)     DATE TEST    10/13 Echo    EF 60+65 %   11/13 .myoview   EF 62 % No ischemia Personally reviewed    1/23 Echo  40-45%       Past Medical History:  Diagnosis Date   ADJUSTMENT DISORDER WITHOUT DEPRESSED MOOD    ANXIETY    DVT (deep venous thrombosis) (HCC)    of the right arm post pacemaker; on Xarelto for 3 months   EXTERNAL HEMORRHOIDS    Gout    Hypertension    OBESITY, NOS    OSTEOARTHRITIS, MULTI SITES    Pacemaker- [employer]    DOI 11/13   Right bundle branch block and left anterior fascicular block Dec 2013   Progressive heart block with exercise>>3:1; s/p PTVP   Sinus bradycardia     Past Surgical History:  Procedure Laterality Date   BIV UPGRADE N/A 11/21/2021   Procedure: BIV UPGRADE;  Surgeon: Deboraha Sprang, MD;  Location: [place] CV LAB;  Service: Cardiovascular;  Laterality: N/A;   PACEMAKER INSERTION  December 2013   PERMANENT PACEMAKER INSERTION N/A 08/03/2012   Procedure: PERMANENT PACEMAKER INSERTION;  Surgeon: Deboraha Sprang, MD;  Location: [facility] CATH LAB;  Service: Cardiovascular;  Laterality: N/A;   PPM GENERATOR CHANGEOUT N/A 11/21/2021   Procedure: PPM GENERATOR CHANGEOUT;  Surgeon: Deboraha Sprang, MD;  Location: [place] CV LAB;  Service:  Cardiovascular;  Laterality: N/A;    Current Outpatient Medications  Medication Sig Dispense Refill   amLODipine (NORVASC) 10 MG tablet Take 1 tablet (10 mg total) by mouth daily. 90 tablet 3   aspirin 81 MG tablet Take 81 mg by mouth daily.       Colchicine (MITIGARE) 0.6 MG CAPS Take 0.6 mg by mouth daily. For gout flares, take 2 capsules followed by 1 capsule and hour later. After that, take 1 capsule daily until the flare resolves. 30 capsule 0   lisinopril-hydrochlorothiazide (ZESTORETIC) 20-25 MG tablet Take 1 tablet by mouth daily. 90 tablet 3   Multiple Vitamins-Minerals (MULTIVITAMIN WITH MINERALS) tablet Take 1 tablet by mouth daily.     simvastatin (ZOCOR) 40 MG tablet TAKE 1 TABLET (40 MG TOTAL) BY MOUTH AT BEDTIME. 90 tablet 3   tadalafil (CIALIS) 20 MG tablet TAKE 1/2-1 TABLET BY MOUTH EVERY OTHER DAY AS NEEDED FOR ERECTILE DYSFUNCTION 10 tablet 0   No current facility-administered medications for this visit.    No Known Allergies  Review of Systems negative except from HPI and PMH  Physical Exam BP 126/74   Pulse 90   Ht 5' 9.5" (1.765 m)   Wt 186 lb 9.6 oz (84.6 kg)   SpO2  99%   BMI 27.16 kg/m   Well developed and well nourished in no acute distress HENT normal Neck supple with JVP-flat Clear Device pocket well healed; without hematoma or erythema.  There is no tethering  Regular rate and rhythm, no  murmur Abd-soft with active BS No Clubbing cyanosis  edema Skin-warm and dry A & Oriented  Grossly normal sensory and motor function  ECG sinus with P-synchronous/ AV  pacing with upright QRS V1 and Qs lead 1   Assessment and  Plan Complete heart block stable post pacing  Pacemaker [employer]    Hypertension      PVCs  PVC count < 1 %  Cardiomyopathy  continue amlodipine 10 and lisinopril/HCTZ 20/25  Might benefit from SGLT 2 but will wait and see what happens to LV function, in the absence of symptoms hard to justify  I have reviewed the patient's  device interrogation and testing. I agree with the final report from [place].

## 2022-02-26 NOTE — Patient Instructions (Signed)
Medication Instructions:  Your physician recommends that you continue on your current medications as directed. Please refer to the Current Medication list given to you today.  *If you need a refill on your cardiac medications before your next appointment, please call your pharmacy*   Lab Work: None ordered.  If you have labs (blood work) drawn today and your tests are completely normal, you will receive your results only by: [place] (if you have MyChart) OR A paper copy in the mail If you have any lab test that is abnormal or we need to change your treatment, we will call you to review the results.   Testing/Procedures: 9 months prior to appt with Dr Caryl Comes Your physician has requested that you have an echocardiogram. Echocardiography is a painless test that uses sound waves to create images of your heart. It provides your doctor with information about the size and shape of your heart and how well your heart's chambers and valves are working. This procedure takes approximately one hour. There are no restrictions for this procedure.    Follow-Up: At [facility], you and your health needs are our priority.  As part of our continuing mission to provide you with exceptional heart care, we have created designated Provider Care Teams.  These Care Teams include your primary Cardiologist (physician) and Advanced Practice Providers (APPs -  Physician Assistants and Nurse Practitioners) who all work together to provide you with the care you need, when you need it.  We recommend signing up for the patient portal called "MyChart".  Sign up information is provided on this After Visit Summary.  MyChart is used to connect with patients for Virtual Visits (Telemedicine).  Patients are able to view lab/test results, encounter notes, upcoming appointments, etc.  Non-urgent messages can be sent to your provider as well.   To learn more about what you can do with MyChart, go to [email or web address].     Your next appointment:   9 months with Dr Caryl Comes  Important Information About [place]

## 2022-03-01 NOTE — Progress Notes (Signed)
Remote pacemaker transmission.   

## 2022-03-06 ENCOUNTER — Other Ambulatory Visit: Payer: Self-pay | Admitting: Student

## 2022-03-06 DIAGNOSIS — N529 Male erectile dysfunction, unspecified: Secondary | ICD-10-CM

## 2022-03-31 ENCOUNTER — Other Ambulatory Visit: Payer: Self-pay | Admitting: Student

## 2022-03-31 DIAGNOSIS — N529 Male erectile dysfunction, unspecified: Secondary | ICD-10-CM

## 2022-05-23 ENCOUNTER — Ambulatory Visit (INDEPENDENT_AMBULATORY_CARE_PROVIDER_SITE_OTHER): Payer: Medicare Other

## 2022-05-23 DIAGNOSIS — Z950 Presence of cardiac pacemaker: Secondary | ICD-10-CM

## 2022-05-23 LAB — CUP PACEART REMOTE DEVICE CHECK
Battery Remaining Longevity: 92 mo
Battery Remaining Percentage: 95.5 %
Battery Voltage: 3.01 V
Brady Statistic AP VP Percent: 36 %
Brady Statistic AP VS Percent: 1 %
Brady Statistic AS VP Percent: 64 %
Brady Statistic AS VS Percent: 1 %
Brady Statistic RA Percent Paced: 36 %
Date Time Interrogation Session: 20230907020009
Implantable Lead Implant Date: 20131118
Implantable Lead Implant Date: 20131118
Implantable Lead Implant Date: 20230308
Implantable Lead Location: 753858
Implantable Lead Location: 753859
Implantable Lead Location: 753860
Implantable Pulse Generator Implant Date: 20230308
Lead Channel Impedance Value: 340 Ohm
Lead Channel Impedance Value: 440 Ohm
Lead Channel Impedance Value: 910 Ohm
Lead Channel Pacing Threshold Amplitude: 0.5 V
Lead Channel Pacing Threshold Amplitude: 0.75 V
Lead Channel Pacing Threshold Amplitude: 1.125 V
Lead Channel Pacing Threshold Pulse Width: 0.5 ms
Lead Channel Pacing Threshold Pulse Width: 0.5 ms
Lead Channel Pacing Threshold Pulse Width: 0.5 ms
Lead Channel Sensing Intrinsic Amplitude: 10.9 mV
Lead Channel Sensing Intrinsic Amplitude: 4.2 mV
Lead Channel Setting Pacing Amplitude: 1.75 V
Lead Channel Setting Pacing Amplitude: 2 V
Lead Channel Setting Pacing Amplitude: 2.125
Lead Channel Setting Pacing Pulse Width: 0.5 ms
Lead Channel Setting Pacing Pulse Width: 0.5 ms
Lead Channel Setting Sensing Sensitivity: 4 mV
Pulse Gen Model: 3562
Pulse Gen Serial Number: 8062726

## 2022-05-28 ENCOUNTER — Other Ambulatory Visit: Payer: Self-pay | Admitting: Student

## 2022-05-28 DIAGNOSIS — I10 Essential (primary) hypertension: Secondary | ICD-10-CM

## 2022-06-08 NOTE — Progress Notes (Signed)
Remote pacemaker transmission.   

## 2022-06-24 ENCOUNTER — Ambulatory Visit (INDEPENDENT_AMBULATORY_CARE_PROVIDER_SITE_OTHER): Payer: Medicare Other | Admitting: Family Medicine

## 2022-06-24 VITALS — BP 140/78 | HR 78 | Ht 69.5 in | Wt 190.0 lb

## 2022-06-24 DIAGNOSIS — M25511 Pain in right shoulder: Secondary | ICD-10-CM | POA: Insufficient documentation

## 2022-06-24 DIAGNOSIS — Z23 Encounter for immunization: Secondary | ICD-10-CM

## 2022-06-24 NOTE — Progress Notes (Signed)
    SUBJECTIVE:   CHIEF COMPLAINT / HPI:   Patient presents with right arm pain that started about 2 weeks ago. Describes the pain as occasional only when he puts his arm in a certain position. Presents here with daughter too. He was pulling the trash can across the drive way when his arm started to hurt, he says it was a very heavy trash can. Denies numbness and tingling. Denies radiating pain. Denies noticing any erythema or edema. The pain has improved over time, now he is able to move it around more with time. Aleve helps improve the pain and certain movements too rapidly aggravate the pain.    OBJECTIVE:   BP (!) 140/78   Pulse 78   Ht 5' 9.5" (1.765 m)   Wt 190 lb (86.2 kg)   SpO2 98%   BMI 27.66 kg/m   General: Patient well-appearing, in no acute distress. Resp: normal work of breathing noted MSK: no erythema, edema or gross abnormality upon inspection. No tenderness on palpation of glenohumeral joints bilaterally, full active and passive ROM bilaterally with slower movements on the right side compared to the left Neuro: gross sensation intact, 5/5 grip strength bilaterally, 5/5 UE strength bilaterally   ASSESSMENT/PLAN:   Acute pain of right shoulder -likely secondary to recent injury causing MSK strain -reassuringly pain is improving and patient still maintains full function of arm -may take tylenol or aleve for pain as needed  -conservative measures discussed and instructed to continue -ROM exercises handout provided -no indication for imaging at this time although if still persists in 2-3 weeks then instructed to return for reevaluation and may consider imaging at that time   -PHQ-9 score of 0 with negative question 9 reviewed.   Donney Dice, [place]

## 2022-06-24 NOTE — Patient Instructions (Addendum)
It was great seeing you today!  Today we discussed your arm pain, I feel this is due to muscle strain from your recent injury. I am glad that it is improving. You may continue to take aleve or tylenol for the pain. Applying a heating pad can help as well. If it does not improve in 2-3 weeks, then please return and we may get imaging of the area.   Attached are some range of motion exercises, try these slowly and do not do anything that causes more pain.   Please follow up at your next scheduled appointment, if anything arises between now and then, please don't hesitate to contact our office.   Thank you for allowing [place] to be a part of your medical care!  Thank you, Dr. Larae Grooms

## 2022-06-24 NOTE — Assessment & Plan Note (Addendum)
-  likely secondary to recent injury causing MSK strain -reassuringly pain is improving and patient still maintains full function of arm -may take tylenol or aleve for pain as needed  -conservative measures discussed and instructed to continue -ROM exercises handout provided -no indication for imaging at this time although if still persists in 2-3 weeks then instructed to return for reevaluation and may consider imaging at that time

## 2022-07-08 ENCOUNTER — Other Ambulatory Visit: Payer: Self-pay | Admitting: Student

## 2022-07-08 DIAGNOSIS — M109 Gout, unspecified: Secondary | ICD-10-CM

## 2022-08-16 ENCOUNTER — Other Ambulatory Visit: Payer: Self-pay | Admitting: Student

## 2022-08-16 DIAGNOSIS — I10 Essential (primary) hypertension: Secondary | ICD-10-CM

## 2022-08-22 ENCOUNTER — Ambulatory Visit (INDEPENDENT_AMBULATORY_CARE_PROVIDER_SITE_OTHER): Payer: Medicare Other

## 2022-08-22 DIAGNOSIS — I442 Atrioventricular block, complete: Secondary | ICD-10-CM | POA: Diagnosis not present

## 2022-08-22 LAB — CUP PACEART REMOTE DEVICE CHECK
Battery Remaining Longevity: 89 mo
Battery Remaining Percentage: 93 %
Battery Voltage: 3.01 V
Brady Statistic AP VP Percent: 37 %
Brady Statistic AP VS Percent: 1 %
Brady Statistic AS VP Percent: 63 %
Brady Statistic AS VS Percent: 1 %
Brady Statistic RA Percent Paced: 37 %
Date Time Interrogation Session: 20231207020009
Implantable Lead Connection Status: 753985
Implantable Lead Connection Status: 753985
Implantable Lead Connection Status: 753985
Implantable Lead Implant Date: 20131118
Implantable Lead Implant Date: 20131118
Implantable Lead Implant Date: 20230308
Implantable Lead Location: 753858
Implantable Lead Location: 753859
Implantable Lead Location: 753860
Implantable Pulse Generator Implant Date: 20230308
Lead Channel Impedance Value: 340 Ohm
Lead Channel Impedance Value: 390 Ohm
Lead Channel Impedance Value: 960 Ohm
Lead Channel Pacing Threshold Amplitude: 0.5 V
Lead Channel Pacing Threshold Amplitude: 0.625 V
Lead Channel Pacing Threshold Amplitude: 1.375 V
Lead Channel Pacing Threshold Pulse Width: 0.5 ms
Lead Channel Pacing Threshold Pulse Width: 0.5 ms
Lead Channel Pacing Threshold Pulse Width: 0.5 ms
Lead Channel Sensing Intrinsic Amplitude: 10.9 mV
Lead Channel Sensing Intrinsic Amplitude: 5 mV
Lead Channel Setting Pacing Amplitude: 1.625
Lead Channel Setting Pacing Amplitude: 2 V
Lead Channel Setting Pacing Amplitude: 2.375
Lead Channel Setting Pacing Pulse Width: 0.5 ms
Lead Channel Setting Pacing Pulse Width: 0.5 ms
Lead Channel Setting Sensing Sensitivity: 4 mV
Pulse Gen Model: 3562
Pulse Gen Serial Number: 8062726

## 2022-09-13 NOTE — Progress Notes (Signed)
Remote pacemaker transmission.   

## 2022-11-15 ENCOUNTER — Ambulatory Visit (HOSPITAL_COMMUNITY): Payer: Medicare Other | Attending: Internal Medicine

## 2022-11-15 DIAGNOSIS — I4510 Unspecified right bundle-branch block: Secondary | ICD-10-CM | POA: Insufficient documentation

## 2022-11-15 DIAGNOSIS — Z950 Presence of cardiac pacemaker: Secondary | ICD-10-CM | POA: Diagnosis not present

## 2022-11-15 DIAGNOSIS — I10 Essential (primary) hypertension: Secondary | ICD-10-CM | POA: Insufficient documentation

## 2022-11-15 DIAGNOSIS — I493 Ventricular premature depolarization: Secondary | ICD-10-CM | POA: Insufficient documentation

## 2022-11-15 LAB — ECHOCARDIOGRAM COMPLETE
Area-P 1/2: 2.77 cm2
S' Lateral: 2.5 cm

## 2022-11-21 ENCOUNTER — Ambulatory Visit: Payer: Medicare Other

## 2022-11-21 DIAGNOSIS — I442 Atrioventricular block, complete: Secondary | ICD-10-CM

## 2022-11-21 LAB — CUP PACEART REMOTE DEVICE CHECK
Battery Remaining Longevity: 77 mo
Battery Remaining Percentage: 90 %
Battery Voltage: 3.01 V
Brady Statistic AP VP Percent: 35 %
Brady Statistic AP VS Percent: 1 %
Brady Statistic AS VP Percent: 65 %
Brady Statistic AS VS Percent: 1 %
Brady Statistic RA Percent Paced: 35 %
Date Time Interrogation Session: 20240307020010
Implantable Lead Connection Status: 753985
Implantable Lead Connection Status: 753985
Implantable Lead Connection Status: 753985
Implantable Lead Implant Date: 20131118
Implantable Lead Implant Date: 20131118
Implantable Lead Implant Date: 20230308
Implantable Lead Location: 753858
Implantable Lead Location: 753859
Implantable Lead Location: 753860
Implantable Pulse Generator Implant Date: 20230308
Lead Channel Impedance Value: 330 Ohm
Lead Channel Impedance Value: 440 Ohm
Lead Channel Impedance Value: 990 Ohm
Lead Channel Pacing Threshold Amplitude: 0.625 V
Lead Channel Pacing Threshold Amplitude: 0.75 V
Lead Channel Pacing Threshold Amplitude: 1.5 V
Lead Channel Pacing Threshold Pulse Width: 0.5 ms
Lead Channel Pacing Threshold Pulse Width: 0.5 ms
Lead Channel Pacing Threshold Pulse Width: 0.5 ms
Lead Channel Sensing Intrinsic Amplitude: 10.9 mV
Lead Channel Sensing Intrinsic Amplitude: 4.3 mV
Lead Channel Setting Pacing Amplitude: 1.75 V
Lead Channel Setting Pacing Amplitude: 2 V
Lead Channel Setting Pacing Amplitude: 2.5 V
Lead Channel Setting Pacing Pulse Width: 0.5 ms
Lead Channel Setting Pacing Pulse Width: 0.5 ms
Lead Channel Setting Sensing Sensitivity: 4 mV
Pulse Gen Model: 3562
Pulse Gen Serial Number: 8062726

## 2022-12-24 NOTE — Progress Notes (Signed)
Remote pacemaker transmission.   

## 2023-02-20 ENCOUNTER — Ambulatory Visit (INDEPENDENT_AMBULATORY_CARE_PROVIDER_SITE_OTHER): Payer: Medicare Other

## 2023-02-20 DIAGNOSIS — I442 Atrioventricular block, complete: Secondary | ICD-10-CM

## 2023-02-20 LAB — CUP PACEART REMOTE DEVICE CHECK
Battery Remaining Longevity: 83 mo
Battery Remaining Percentage: 87 %
Battery Voltage: 2.99 V
Brady Statistic AP VP Percent: 34 %
Brady Statistic AP VS Percent: 1 %
Brady Statistic AS VP Percent: 66 %
Brady Statistic AS VS Percent: 1 %
Brady Statistic RA Percent Paced: 34 %
Date Time Interrogation Session: 20240606020009
Implantable Lead Connection Status: 753985
Implantable Lead Connection Status: 753985
Implantable Lead Connection Status: 753985
Implantable Lead Implant Date: 20131118
Implantable Lead Implant Date: 20131118
Implantable Lead Implant Date: 20230308
Implantable Lead Location: 753858
Implantable Lead Location: 753859
Implantable Lead Location: 753860
Implantable Pulse Generator Implant Date: 20230308
Lead Channel Impedance Value: 1000 Ohm
Lead Channel Impedance Value: 330 Ohm
Lead Channel Impedance Value: 430 Ohm
Lead Channel Pacing Threshold Amplitude: 0.5 V
Lead Channel Pacing Threshold Amplitude: 0.75 V
Lead Channel Pacing Threshold Amplitude: 1.75 V
Lead Channel Pacing Threshold Pulse Width: 0.5 ms
Lead Channel Pacing Threshold Pulse Width: 0.5 ms
Lead Channel Pacing Threshold Pulse Width: 0.5 ms
Lead Channel Sensing Intrinsic Amplitude: 10.9 mV
Lead Channel Sensing Intrinsic Amplitude: 4.1 mV
Lead Channel Setting Pacing Amplitude: 1.75 V
Lead Channel Setting Pacing Amplitude: 2 V
Lead Channel Setting Pacing Amplitude: 2.75 V
Lead Channel Setting Pacing Pulse Width: 0.5 ms
Lead Channel Setting Pacing Pulse Width: 0.5 ms
Lead Channel Setting Sensing Sensitivity: 4 mV
Pulse Gen Model: 3562
Pulse Gen Serial Number: 8062726

## 2023-03-12 NOTE — Progress Notes (Signed)
Remote pacemaker transmission.   

## 2023-05-08 ENCOUNTER — Encounter: Payer: Medicare Other | Admitting: Student

## 2023-05-08 NOTE — Progress Notes (Deleted)
    SUBJECTIVE:   Chief compliant/HPI: annual examination  Robert Flynn is a 77 y.o. who presents today for an annual exam.   History tabs reviewed and updated ***.   Review of systems form reviewed and notable for ***.   OBJECTIVE:   There were no vitals taken for this visit.  ***  ASSESSMENT/PLAN:   No problem-specific Assessment & Plan notes found for this encounter.    Annual Examination  See AVS for age appropriate recommendations.  PHQ score ***, reviewed and discussed.  Blood pressure value is *** goal, discussed.   Considered the following screening exams based upon USPSTF recommendations: Diabetes screening: {discussed/ordered:14545} Screening for elevated cholesterol: {discussed/ordered:14545} HIV testing: {discussed/ordered:14545} Hepatitis C: {discussed/ordered:14545} Hepatitis B: {discussed/ordered:14545} Syphilis if at high risk: {discussed/ordered:14545} Reviewed risk factors for latent tuberculosis and {not indicated/requested/declined:14582} Colorectal cancer screening: {crcscreen:23821::"discussed, colonoscopy ordered"} Lung cancer screening: {discussed/declined/written RUEA:54098} See documentation below regarding discussion and indication.  PSA discussed and after engaging in discussion of possible risks, benefits and complications of screening patient elected to ***.   Follow up in 1 year or sooner if indicated.    Levin Erp, MD [facility]

## 2023-05-20 ENCOUNTER — Encounter: Payer: Self-pay | Admitting: Pharmacist

## 2023-05-22 ENCOUNTER — Ambulatory Visit (INDEPENDENT_AMBULATORY_CARE_PROVIDER_SITE_OTHER): Payer: Medicare Other

## 2023-05-22 DIAGNOSIS — I442 Atrioventricular block, complete: Secondary | ICD-10-CM | POA: Diagnosis not present

## 2023-05-22 LAB — CUP PACEART REMOTE DEVICE CHECK
Battery Remaining Longevity: 80 mo
Battery Remaining Percentage: 83 %
Battery Voltage: 2.99 V
Brady Statistic AP VP Percent: 33 %
Brady Statistic AP VS Percent: 1 %
Brady Statistic AS VP Percent: 67 %
Brady Statistic AS VS Percent: 1 %
Brady Statistic RA Percent Paced: 33 %
Date Time Interrogation Session: 20240905020008
Implantable Lead Connection Status: 753985
Implantable Lead Connection Status: 753985
Implantable Lead Connection Status: 753985
Implantable Lead Implant Date: 20131118
Implantable Lead Implant Date: 20131118
Implantable Lead Implant Date: 20230308
Implantable Lead Location: 753858
Implantable Lead Location: 753859
Implantable Lead Location: 753860
Implantable Pulse Generator Implant Date: 20230308
Lead Channel Impedance Value: 1000 Ohm
Lead Channel Impedance Value: 340 Ohm
Lead Channel Impedance Value: 450 Ohm
Lead Channel Pacing Threshold Amplitude: 0.625 V
Lead Channel Pacing Threshold Amplitude: 0.75 V
Lead Channel Pacing Threshold Amplitude: 1.5 V
Lead Channel Pacing Threshold Pulse Width: 0.5 ms
Lead Channel Pacing Threshold Pulse Width: 0.5 ms
Lead Channel Pacing Threshold Pulse Width: 0.5 ms
Lead Channel Sensing Intrinsic Amplitude: 11.3 mV
Lead Channel Sensing Intrinsic Amplitude: 5 mV
Lead Channel Setting Pacing Amplitude: 1.75 V
Lead Channel Setting Pacing Amplitude: 2 V
Lead Channel Setting Pacing Amplitude: 2.5 V
Lead Channel Setting Pacing Pulse Width: 0.5 ms
Lead Channel Setting Pacing Pulse Width: 0.5 ms
Lead Channel Setting Sensing Sensitivity: 4 mV
Pulse Gen Model: 3562
Pulse Gen Serial Number: 8062726

## 2023-05-22 NOTE — Progress Notes (Signed)
    SUBJECTIVE:   CHIEF COMPLAINT / HPI: Check up  Hypertension: Patient is a 77 y.o. male who present today for follow up of hypertension.   Patient endorses difficulties with medication compliance  Home medications include: zestoretic 20-25(hasn't taken in one year), amlodipine 10 mg daily Patient endorses taking these medications as prescribed.  Most recent creatinine trend:  Lab Results  Component Value Date   CREATININE 1.39 (H) 11/05/2021   CREATININE 1.43 (H) 08/02/2021   CREATININE 1.37 (H) 07/18/2021   Patient does not check blood pressure at home.  Urinary Symptoms States that he has been urinating 2 times at night which is new to him. States he has normal stream but feels like he does not completely empty his bladder.  Never smoker Drinks socially  Clean up [employer], active  PERTINENT  PMH / PSH: HTN, AV block s/p pacer, thrombocytopenia  OBJECTIVE:   BP (!) 164/89   Pulse 70   Ht 5\' 9"  (1.753 m)   Wt 185 lb (83.9 kg)   SpO2 97%   BMI 27.32 kg/m   General: NAD, awake, alert, responsive to questions Head: Normocephalic atraumatic, neck masses CV: Regular rate and rhythm Respiratory: Clear to ausculation bilaterally, no wheezes rales or crackles, chest rises symmetrically,  no increased work of breathing Abdomen: Soft, non-tender, non-distended, normoactive bowel sounds  Extremities: Moves upper and lower extremities freely, no edema in LE Neuro: No focal deficits Skin: No rashes or lesions visualized   ASSESSMENT/PLAN:   HYPERTENSION, BENIGN SYSTEMIC Patient's blood pressure is not controlled today. BP: (!) 164/89. Goal of 140/90. Patient's medication regimen includes  zestoretic 20-25(hasn't taken in one year), amlodipine 10 mg daily. -Changes to current regimen include restart Zestoretic -Referral to pharmacy clinic due to  recheck blood pressure after restarting medication -Labs: CMP -Follow up in 2 weeks with Dr. Raymondo Band  Nocturia BPH like  symptoms on history.  We discussed PSA and possibility of prostate cancer.  He declines PSA testing today.  He would like to start medication to see if this may help some of his nocturnal urination. -Flomax 0.4 mg sent to pharmacy  Thrombocytopenia (HCC) Repeat CBC today.   Need for shingles vaccine - Zoster Recombinant (Shingrix )  Encounter for immunization - Flu Vaccine Trivalent High Dose (Fluad)  Levin Erp, MD [facility]

## 2023-05-23 ENCOUNTER — Ambulatory Visit (INDEPENDENT_AMBULATORY_CARE_PROVIDER_SITE_OTHER): Payer: Medicare Other | Admitting: Student

## 2023-05-23 ENCOUNTER — Encounter: Payer: Self-pay | Admitting: Student

## 2023-05-23 VITALS — BP 164/89 | HR 70 | Ht 69.0 in | Wt 185.0 lb

## 2023-05-23 DIAGNOSIS — N400 Enlarged prostate without lower urinary tract symptoms: Secondary | ICD-10-CM | POA: Diagnosis not present

## 2023-05-23 DIAGNOSIS — D696 Thrombocytopenia, unspecified: Secondary | ICD-10-CM | POA: Diagnosis not present

## 2023-05-23 DIAGNOSIS — I10 Essential (primary) hypertension: Secondary | ICD-10-CM

## 2023-05-23 DIAGNOSIS — R351 Nocturia: Secondary | ICD-10-CM | POA: Diagnosis not present

## 2023-05-23 DIAGNOSIS — Z23 Encounter for immunization: Secondary | ICD-10-CM | POA: Diagnosis not present

## 2023-05-23 LAB — POCT GLYCOSYLATED HEMOGLOBIN (HGB A1C): HbA1c, POC (controlled diabetic range): 5.9 % (ref 0.0–7.0)

## 2023-05-23 MED ORDER — SIMVASTATIN 40 MG PO TABS
40.0000 mg | ORAL_TABLET | Freq: Every day | ORAL | 3 refills | Status: DC
Start: 2023-05-23 — End: 2023-06-10

## 2023-05-23 MED ORDER — LISINOPRIL-HYDROCHLOROTHIAZIDE 20-25 MG PO TABS
1.0000 | ORAL_TABLET | Freq: Every day | ORAL | 0 refills | Status: DC
Start: 2023-05-23 — End: 2023-06-10

## 2023-05-23 MED ORDER — AMLODIPINE BESYLATE 10 MG PO TABS
10.0000 mg | ORAL_TABLET | Freq: Every day | ORAL | 0 refills | Status: DC
Start: 2023-05-23 — End: 2023-08-05

## 2023-05-23 MED ORDER — TAMSULOSIN HCL 0.4 MG PO CAPS: 0.4000 mg | ORAL_CAPSULE | Freq: Every day | ORAL | 3 refills | Status: DC

## 2023-05-23 NOTE — Assessment & Plan Note (Signed)
BPH like symptoms on history.  We discussed PSA and possibility of prostate cancer.  He declines PSA testing today.  He would like to start medication to see if this may help some of his nocturnal urination. -Flomax 0.4 mg sent to pharmacy

## 2023-05-23 NOTE — Assessment & Plan Note (Signed)
Patient's blood pressure is not controlled today. BP: (!) 164/89. Goal of 140/90. Patient's medication regimen includes  zestoretic 20-25(hasn't taken in one year), amlodipine 10 mg daily. -Changes to current regimen include restart Zestoretic -Referral to pharmacy clinic due to  recheck blood pressure after restarting medication -Labs: CMP -Follow up in 2 weeks with Dr. Raymondo Band

## 2023-05-23 NOTE — Patient Instructions (Addendum)
It was great to see you! Thank you for allowing me to participate in your care!   I recommend that you always bring your medications to each appointment as this makes it easy to ensure we are on the correct medications and helps [place] not miss when refills are needed.  Our plans for today:  - 2 week follow up with Dr. Raymondo Band for BP follow up-I have sent in your blood pressure medications - Medicare wellness visit schedule this - Shingles shot sent into pharmacy - Flu shot today  We are checking some labs today, I will call you if they are abnormal will send you a MyChart message or a letter if they are normal.  If you do not hear about your labs in the next 2 weeks please let us know.  Take care and seek immediate care sooner if you develop any concerns. Please remember to show up 15 minutes before your scheduled appointment time!  Levin Erp, MD [facility]

## 2023-05-23 NOTE — Assessment & Plan Note (Signed)
Repeat CBC today 

## 2023-05-24 LAB — COMPREHENSIVE METABOLIC PANEL
ALT: 10 IU/L (ref 0–44)
AST: 20 IU/L (ref 0–40)
Albumin: 4.5 g/dL (ref 3.8–4.8)
Alkaline Phosphatase: 77 IU/L (ref 44–121)
BUN/Creatinine Ratio: 13 (ref 10–24)
BUN: 17 mg/dL (ref 8–27)
Bilirubin Total: 0.7 mg/dL (ref 0.0–1.2)
CO2: 25 mmol/L (ref 20–29)
Calcium: 9.6 mg/dL (ref 8.6–10.2)
Chloride: 106 mmol/L (ref 96–106)
Creatinine, Ser: 1.36 mg/dL — ABNORMAL HIGH (ref 0.76–1.27)
Globulin, Total: 3.5 g/dL (ref 1.5–4.5)
Glucose: 109 mg/dL — ABNORMAL HIGH (ref 70–99)
Potassium: 4.5 mmol/L (ref 3.5–5.2)
Sodium: 146 mmol/L — ABNORMAL HIGH (ref 134–144)
Total Protein: 8 g/dL (ref 6.0–8.5)
eGFR: 53 mL/min/{1.73_m2} — ABNORMAL LOW (ref >59)

## 2023-05-24 LAB — CBC
Hematocrit: 42.1 % (ref 37.5–51.0)
Hemoglobin: 14 g/dL (ref 13.0–17.7)
MCH: 28.7 pg (ref 26.6–33.0)
MCHC: 33.3 g/dL (ref 31.5–35.7)
MCV: 86 fL (ref 79–97)
Platelets: 119 10*3/uL — ABNORMAL LOW (ref 150–450)
RBC: 4.88 x10E6/uL (ref 4.14–5.80)
RDW: 14.9 % (ref 11.6–15.4)
WBC: 4.7 10*3/uL (ref 3.4–10.8)

## 2023-05-27 NOTE — Progress Notes (Signed)
Remote pacemaker transmission.   

## 2023-06-05 ENCOUNTER — Encounter (HOSPITAL_COMMUNITY): Payer: Self-pay

## 2023-06-05 ENCOUNTER — Emergency Department (HOSPITAL_COMMUNITY)
Admission: EM | Admit: 2023-06-05 | Discharge: 2023-06-05 | Disposition: A | Discharge status: Home / Self Care | Payer: Medicare Other | Attending: Emergency Medicine | Admitting: Emergency Medicine

## 2023-06-05 ENCOUNTER — Other Ambulatory Visit: Payer: Self-pay

## 2023-06-05 DIAGNOSIS — I10 Essential (primary) hypertension: Secondary | ICD-10-CM | POA: Diagnosis not present

## 2023-06-05 DIAGNOSIS — R42 Dizziness and giddiness: Secondary | ICD-10-CM | POA: Insufficient documentation

## 2023-06-05 LAB — BASIC METABOLIC PANEL
Anion gap: 10 (ref 5–15)
BUN: 19 mg/dL (ref 8–23)
CO2: 24 mmol/L (ref 22–32)
Calcium: 9 mg/dL (ref 8.9–10.3)
Chloride: 107 mmol/L (ref 98–111)
Creatinine, Ser: 1.54 mg/dL — ABNORMAL HIGH (ref 0.61–1.24)
GFR, Estimated: 46 mL/min — ABNORMAL LOW (ref >60)
Glucose, Bld: 114 mg/dL — ABNORMAL HIGH (ref 70–99)
Potassium: 3.1 mmol/L — ABNORMAL LOW (ref 3.5–5.1)
Sodium: 141 mmol/L (ref 135–145)

## 2023-06-05 LAB — CBC
HCT: 40.8 % (ref 39.0–52.0)
Hemoglobin: 13.4 g/dL (ref 13.0–17.0)
MCH: 28.6 pg (ref 26.0–34.0)
MCHC: 32.8 g/dL (ref 30.0–36.0)
MCV: 87 fL (ref 80.0–100.0)
Platelets: 145 10*3/uL — ABNORMAL LOW (ref 150–400)
RBC: 4.69 MIL/uL (ref 4.22–5.81)
RDW: 15.6 % — ABNORMAL HIGH (ref 11.5–15.5)
WBC: 9.3 10*3/uL (ref 4.0–10.5)
nRBC: 0 % (ref 0.0–0.2)

## 2023-06-05 NOTE — Discharge Instructions (Signed)
You were evaluated in the Emergency Department and after careful evaluation, we did not find any emergent condition requiring admission or further testing in the hospital.  Your exam/testing today is overall reassuring.  Please return to the Emergency Department if you experience any worsening of your condition.   Thank you for allowing [place] to be a part of your care.

## 2023-06-05 NOTE — ED Triage Notes (Signed)
Pt c/o dizziness at 1400 today. Pt's LKW today at 1359. PT denies any other complaints.

## 2023-06-05 NOTE — ED Provider Notes (Signed)
[facility]-EMERGENCY DEPT [facility] Emergency Department Provider Note MRN:  [id number]  Arrival date & time: 06/05/23     Chief Complaint   Dizziness   History of Present Illness   Robert Flynn is a 77 y.o. year-old male with a history of pacemaker presenting to the ED with chief complaint of dizziness.  Dizziness that started at 2 PM today, lasted maybe 15 minutes and then resolved.  Described as a lightheadedness, felt a bit unsteady.  Denies having any numbness or weakness to the arms or legs, no fever, no chest pain or shortness of breath.  Currently without any symptoms.  Review of Systems  A thorough review of systems was obtained and all systems are negative except as noted in the HPI and PMH.   Patient's Health History    Past Medical History:  Diagnosis Date   ADJUSTMENT DISORDER WITHOUT DEPRESSED MOOD    ANXIETY    DVT (deep venous thrombosis) (HCC)    of the right arm post pacemaker; on Xarelto for 3 months   EXTERNAL HEMORRHOIDS    Gout    Hypertension    OBESITY, NOS    OSTEOARTHRITIS, MULTI SITES    Pacemaker- [employer]    DOI 11/13   Right bundle branch block and left anterior fascicular block Dec 2013   Progressive heart block with exercise>>3:1; s/p PTVP   Sinus bradycardia     Past Surgical History:  Procedure Laterality Date   BIV UPGRADE N/A 11/21/2021   Procedure: BIV UPGRADE;  Surgeon: Duke Salvia, MD;  Location: [facility] INVASIVE CV LAB;  Service: Cardiovascular;  Laterality: N/A;   PACEMAKER INSERTION  December 2013   PERMANENT PACEMAKER INSERTION N/A 08/03/2012   Procedure: PERMANENT PACEMAKER INSERTION;  Surgeon: Duke Salvia, MD;  Location: [facility] CATH LAB;  Service: Cardiovascular;  Laterality: N/A;   PPM GENERATOR CHANGEOUT N/A 11/21/2021   Procedure: PPM GENERATOR CHANGEOUT;  Surgeon: Duke Salvia, MD;  Location: [facility] INVASIVE CV LAB;  Service: Cardiovascular;  Laterality: N/A;    Family History  Problem Relation Age of Onset   Heart disease  Brother     Social History   Socioeconomic History   Marital status: Married    Spouse name: [name]   Number of children: 5   Years of education: 9   Highest education level: Not on file  Occupational History   Occupation: RetiredTEFL teacher   Tobacco Use   Smoking status: Never   Smokeless tobacco: Never  Substance and Sexual Activity   Alcohol use: Yes    Alcohol/week: 2.0 standard drinks of alcohol    Types: 2 drink(s) per week   Drug use: No   Sexual activity: Not on file  Other Topics Concern   Not on file  Social History Narrative   Health Care POA:    Emergency Contact: Son, [name].    End of Life Plan: Patient does not have advance directives   Who lives with you: Wife   Any pets: none   Diet: Patient eats very little but diet is varied.  Eat an apple a day.   Exercise: Patient does not have regular exercise routine but reports walking a few times a week.    Seatbelts: Patient reports wearing seat belt when in vehicle.    Hobbies: fishing            Social Determinants of Corporate investment banker Strain: Not on file  Food Insecurity: Not on file  Transportation  Needs: Not on file  Physical Activity: Not on file  Stress: Not on file  Social Connections: Not on file  Intimate Partner Violence: Not on file     Physical Exam   Vitals:   06/05/23 1839 06/05/23 2212  BP: (!) 115/97 121/68  Pulse: (!) 104 87  Resp: 17 16  Temp: 99.8 F (37.7 C) 98 F (36.7 C)  SpO2: 97% 100%    CONSTITUTIONAL: Well-appearing, NAD NEURO/PSYCH:  Alert and oriented x 3, normal and symmetric strength and sensation, normal coordination, normal speech, normal gait EYES:  eyes equal and reactive ENT/NECK:  no LAD, no JVD CARDIO: Regular rate, well-perfused, normal S1 and S2 PULM:  CTAB no wheezing or rhonchi GI/GU:  non-distended, non-tender MSK/SPINE:  No gross deformities, no edema SKIN:  no rash, atraumatic   *Additional and/or pertinent findings included in  MDM below  Diagnostic and Interventional Summary    EKG Interpretation Date/Time:  Thursday June 05 2023 18:42:40 EDT Ventricular Rate:  101 PR Interval:  188 QRS Duration:  174 QT Interval:  406 QTC Calculation: 526 R Axis:   -81  Text Interpretation: Atrial-sensed ventricular-paced rhythm Biventricular pacemaker detected Abnormal ECG When compared with ECG of 04-Aug-2012 06:43, pacemaker is now present Confirmed by Meridee Score [phone]) on 06/05/2023 6:44:58 PM       Labs Reviewed  BASIC METABOLIC PANEL - Abnormal; Notable for the following components:      Result Value   Potassium 3.1 (*)    Glucose, Bld 114 (*)    Creatinine, Ser 1.54 (*)    GFR, Estimated 46 (*)    All other components within normal limits  CBC - Abnormal; Notable for the following components:   RDW 15.6 (*)    Platelets 145 (*)    All other components within normal limits  URINALYSIS, ROUTINE W REFLEX MICROSCOPIC  CBG MONITORING, ED    No orders to display    Medications - No data to display   Procedures  /  Critical Care Procedures  ED Course and Medical Decision Making  Initial Impression and Ddx 77 year old male with history of pacemaker, hypertension here after 15 minutes of dizziness.  Currently without any symptoms, looks well, completely normal neurological exam.  Described as a lightheadedness.  Thinks maybe he was hungry.  Overall doubt emergent process, no neurological symptoms or deficits at this time to suggest central vertigo or stroke.  Doubt TIA.  No chest pain or shortness of breath during the dizziness nor currently to suggest significant cardiopulmonary process.  EKG demonstrating a well-functioning pacemaker.  Past medical/surgical history that increases complexity of ED encounter: Pacemaker  Interpretation of Diagnostics I personally reviewed the EKG and my interpretation is as follows: Paced rhythm  Labs reassuring with no significant blood count or electrolyte  disturbance  Patient Reassessment and Ultimate Disposition/Management     Patient observed and reassessed and continues to have no symptoms, well-appearing, interested in going home.  Return precautions provided.  Patient management required discussion with the following services or consulting groups:  None  Complexity of Problems Addressed Acute illness or injury that poses threat of life of bodily function  Additional Data Reviewed and Analyzed Further history obtained from: None  Additional Factors Impacting ED Encounter Risk None  Elmer Sow. Pilar Plate, MD [facility] [email or web address]  Final Clinical Impressions(s) / ED Diagnoses     ICD-10-CM   1. Dizziness  R42       ED Discharge  Orders     None        Discharge Instructions Discussed with and Provided to Patient:     Discharge Instructions      You were evaluated in the Emergency Department and after careful evaluation, we did not find any emergent condition requiring admission or further testing in the hospital.  Your exam/testing today is overall reassuring.  Please return to the Emergency Department if you experience any worsening of your condition.   Thank you for allowing [place] to be a part of your care.       Sabas Sous, MD 06/05/23 [phone]

## 2023-06-10 ENCOUNTER — Ambulatory Visit: Payer: Medicare Other | Admitting: Pharmacist

## 2023-06-10 ENCOUNTER — Encounter: Payer: Self-pay | Admitting: Pharmacist

## 2023-06-10 VITALS — BP 127/69 | HR 67 | Ht 69.5 in | Wt 185.8 lb

## 2023-06-10 DIAGNOSIS — E785 Hyperlipidemia, unspecified: Secondary | ICD-10-CM | POA: Diagnosis not present

## 2023-06-10 DIAGNOSIS — I10 Essential (primary) hypertension: Secondary | ICD-10-CM | POA: Diagnosis not present

## 2023-06-10 MED ORDER — ATORVASTATIN CALCIUM 40 MG PO TABS
40.0000 mg | ORAL_TABLET | Freq: Every day | ORAL | 11 refills | Status: DC
Start: 2023-06-10 — End: 2023-10-07

## 2023-06-10 MED ORDER — LOSARTAN POTASSIUM 100 MG PO TABS
100.0000 mg | ORAL_TABLET | Freq: Every day | ORAL | 11 refills | Status: DC
Start: 2023-06-10 — End: 2023-07-22

## 2023-06-10 NOTE — Patient Instructions (Signed)
It was nice to see you today!  Your goal blood pressure is <140 mmHg.  Medication Changes: START atorvastatin 40 mg once daily.  START losartan 100 mg once daily.  Discontinue Zestoretic (lisinopril-hydrochlorothiazide).  Discontinue Zocor (simvastatin).  Bring the Zestoretic (lisinopril-hydrochlorothiazide) and Zocor (simvastatin) to next visit.  Continue all other medication the same.   Monitor blood pressure at home daily and keep a log (on your phone or piece of paper) to bring with you to your next visit. Write down date, time, blood pressure and pulse.  Keep up the good work with diet and exercise. Aim for a diet full of vegetables, fruit and lean meats (chicken, [place], fish). Try to limit salt intake by eating fresh or frozen vegetables (instead of canned), rinse canned vegetables prior to cooking and do not add any additional salt to meals.

## 2023-06-10 NOTE — Assessment & Plan Note (Signed)
Hyperlipidemia diagnosed in 2015 according to Epic currently uncontrolled with last LDL (03/2020) of 101 mg/dL not at goal of < 70 mg/dL . There is a major drug interaction between simvastatin and colchicine that can increase risk of myopathy and a major drug interaction between simvastatin and amlodipine that can increase levels of simvastatin. - Discontinued simvastatin 40 mg once daily. - Started atorvastatin 40 mg once daily.

## 2023-06-10 NOTE — Assessment & Plan Note (Signed)
Hypertension diagnosed in 2008 according to Epic currently controlled with in-office reading of 127/69 mm Hg on current medications. BP goal < 130/80 mmHg. Medication adherence appears good. Although patient has not experienced a gout flare in years, hydrochlorothiazide can increase risk of gout flare occurrence.  - Discontinued Zestoretic (lisinopril-hydrochlorothiazide) 20-25 mg once daily. - Continued amlodipine 10 mg once daily. - Started losartan 100 mg once daily. -Patient educated on purpose, proper use, and potential adverse effects.  -F/u labs ordered - consider Uric Acid level at follow up visit. -Counseled on lifestyle modifications for blood pressure control including reduced dietary sodium, increased exercise, adequate sleep. -Encouraged patient to check BP at home and bring log of readings to next visit. Counseled on proper use of home BP cuff.

## 2023-06-10 NOTE — Progress Notes (Signed)
S:     Chief Complaint  Patient presents with   Medication Management    Blood pressure follow up   77 y.o. male who presents for hypertension evaluation, education, and management.  PMH is significant for HLD, HTN, and gout.   Patient reports no gout flares in the last couple years. Patient states he occasionally takes colchicine for gout flares, but he does not have any currently on hand.  Patient was referred and last seen by Primary Care Provider, Dr. Laroy Apple, on 05/23/23.    At last visit with Dr. Laroy Apple, patient was restarted on Zestoretic (lisinopril-hydrochlorothiazide) and started on Flomax (tamsulosin) 0.4 mg once daily.   Today, patient arrives in good spirits and presents without any assistance. Denies dizziness, headache, blurred vision, swelling.   Patient was diagnosed with hypertension in 2008 according to Epic.   Family/Social history: sister has high BP  Medication adherence appears good. Patient has taken BP medications today.   Current antihypertensives include: Zestoretic (lisinopril-hydrochlorothiazide) 20-25 mg once daily, amlodipine 10 mg once daily  Antihypertensives tried in the past include: none  Reported home BP readings: Does not have BP cuff at home to check BP  Patient-reported exercise habits: goes on walks   O:  Review of Systems  All other systems reviewed and are negative.   Physical Exam Constitutional:      Appearance: Normal appearance.  Pulmonary:     Effort: Pulmonary effort is normal.  Neurological:     Mental Status: He is alert.  Psychiatric:        Mood and Affect: Mood normal.        Behavior: Behavior normal.        Thought Content: Thought content normal.        Judgment: Judgment normal.     Last 3 Office BP readings: BP Readings from Last 3 Encounters:  06/05/23 121/68  05/23/23 (!) 164/89  06/24/22 (!) 140/78    BMET    Component Value Date/Time   NA 141 06/05/2023 1847   NA 146 (H) 05/23/2023  1231   K 3.1 (L) 06/05/2023 1847   CL 107 06/05/2023 1847   CO2 24 06/05/2023 1847   GLUCOSE 114 (H) 06/05/2023 1847   BUN 19 06/05/2023 1847   BUN 17 05/23/2023 1231   CREATININE 1.54 (H) 06/05/2023 1847   CREATININE 1.34 (H) 09/04/2016 1054   CALCIUM 9.0 06/05/2023 1847   GFRNONAA 46 (L) 06/05/2023 1847   GFRAA 60 03/27/2020 1044    Renal function: Estimated Creatinine Clearance: 38.9 mL/min (A) (by C-G formula based on SCr of 1.54 mg/dL (H)).  Clinical ASCVD: No   A/P: Hypertension diagnosed in 2008 according to Epic currently controlled with in-office reading of 127/69 mm Hg on current medications. BP goal < 130/80 mmHg. Medication adherence appears good. Although patient has not experienced a gout flare in years, hydrochlorothiazide can increase risk of gout flare occurrence.  - Discontinued Zestoretic (lisinopril-hydrochlorothiazide) 20-25 mg once daily. - Continued amlodipine 10 mg once daily. - Started losartan 100 mg once daily. -Patient educated on purpose, proper use, and potential adverse effects.  -F/u labs ordered - consider Uric Acid level at follow up visit. -Counseled on lifestyle modifications for blood pressure control including reduced dietary sodium, increased exercise, adequate sleep. -Encouraged patient to check BP at home and bring log of readings to next visit. Counseled on proper use of home BP cuff.   Hyperlipidemia diagnosed in 2015 according to Epic currently uncontrolled with last LDL (  03/2020) of 101 mg/dL not at goal of < 70 mg/dL . There is a major drug interaction between simvastatin and colchicine that can increase risk of myopathy and a major drug interaction between simvastatin and amlodipine that can increase levels of simvastatin. - Discontinued simvastatin 40 mg once daily. - Started atorvastatin 40 mg once daily.  Results reviewed and written information provided.    Written patient instructions provided. Patient verbalized understanding of  treatment plan.  Total time in face to face counseling 37 minutes.    Follow-up:  Pharmacist visit on 07/01/23. PCP clinic visit in PRN.  Patient seen with Andee Poles, PharmD Candidate.

## 2023-06-11 NOTE — Progress Notes (Signed)
Reviewed and agree with Dr Koval's plan.   

## 2023-07-01 ENCOUNTER — Encounter: Payer: Self-pay | Admitting: Pharmacist

## 2023-07-01 ENCOUNTER — Ambulatory Visit (INDEPENDENT_AMBULATORY_CARE_PROVIDER_SITE_OTHER): Payer: Medicare Other | Admitting: Pharmacist

## 2023-07-01 VITALS — BP 151/78 | HR 67 | Wt 184.8 lb

## 2023-07-01 DIAGNOSIS — E782 Mixed hyperlipidemia: Secondary | ICD-10-CM

## 2023-07-01 DIAGNOSIS — I10 Essential (primary) hypertension: Secondary | ICD-10-CM | POA: Diagnosis not present

## 2023-07-01 DIAGNOSIS — M109 Gout, unspecified: Secondary | ICD-10-CM

## 2023-07-01 NOTE — Assessment & Plan Note (Signed)
Gout diagnosed years ago, currently controlled, last flare over 1 year ago. Last Uric Acid (2016) was 8.3 mg/dL.  - F/u labs ordered - Uric Acid

## 2023-07-01 NOTE — Progress Notes (Signed)
Reviewed and agree with Dr Koval's plan.   

## 2023-07-01 NOTE — Progress Notes (Signed)
S:     Chief Complaint  Patient presents with   Medication Management    BP F/U   77 y.o. male who presents for hypertension evaluation, education, and management.  PMH is significant for HTN, HLD, Gout.  Patient was referred and last seen by Primary Care Provider, Dr. Laroy Apple, on 05/23/2023.   At last visit, Pt was restarted on lisinopril-hydrochlorothiazide 20-25 mg daily.   Visit on 06/10/2023, discontinued lisinopril-hydrochlorothiazide 20-25 mg daily, due to history of gout and associated risk of flares. started losartan 100 mg daily, continued amlodipine 10 mg daily. Discontinued simvastatin 40 mg daily, due to drug interaction with colchicine, which Pt takes occasionally for gout flares but has not had a flare in over a year, as well as drug interaction between simvastatin and amlodipine. Started atorvastatin 40 mg daily.   Today, patient arrives in good spirits and presents without assistance. Denies dizziness, headache, blurred vision, swelling.   Patient reports hypertension was diagnosed ~40 years ago, reported he had a heart attack in the late 1980s and has been on medication ever since.   Family/Social history: sister has high BP  Medication adherence good . Patient has taken BP medications today.   Current antihypertensives include: amlodipine 10 mg daily, losartan 100 mg daily  Antihypertensives tried in the past include: lisinopril-hydrochlorothiazide 20-25 mg daily  Reported home BP readings: None  Patient reported dietary habits: Eats 1-2 meals/day - eats lots of veggies, chicken, some pork, eats lots of fruit, grits in the morning Drinks: water, soda occasionally  Patient-reported exercise habits: works M,W,S cleaning a church for exercise  O:  Review of Systems  All other systems reviewed and are negative.   Physical Exam Vitals reviewed.  Constitutional:      Appearance: Normal appearance.  Pulmonary:     Effort: Pulmonary effort is normal.   Neurological:     Mental Status: He is alert.  Psychiatric:        Mood and Affect: Mood normal.        Behavior: Behavior normal.        Thought Content: Thought content normal.        Judgment: Judgment normal.    Last 3 Office BP readings: BP Readings from Last 3 Encounters:  07/01/23 (!) 151/74  06/10/23 127/69  06/05/23 121/68    BMET    Component Value Date/Time   NA 141 06/05/2023 1847   NA 146 (H) 05/23/2023 1231   K 3.1 (L) 06/05/2023 1847   CL 107 06/05/2023 1847   CO2 24 06/05/2023 1847   GLUCOSE 114 (H) 06/05/2023 1847   BUN 19 06/05/2023 1847   BUN 17 05/23/2023 1231   CREATININE 1.54 (H) 06/05/2023 1847   CREATININE 1.34 (H) 09/04/2016 1054   CALCIUM 9.0 06/05/2023 1847   GFRNONAA 46 (L) 06/05/2023 1847   GFRAA 60 03/27/2020 1044    Renal function: CrCl cannot be calculated (Patient's most recent lab result is older than the maximum 21 days allowed.).  Clinical ASCVD: Yes  - Per patient report diagnosed with MI in 1980s The ASCVD Risk score (Arnett DK, et al., 2019) failed to calculate for the following reasons:   Cannot find a previous HDL lab   Cannot find a previous total cholesterol lab  A/P: Hypertension diagnosed in 1980s per Pt, currently uncontrolled on current medications. BP goal < 130/80 mmHg. Medication adherence appears good. Control is suboptimal due to suboptimal medication regimen - worsened control since stopping Thiazide.  - No  medication changes - pending lab values, consider additional agent - possibly restart low dose hydrochlorothiazide -Patient educated on purpose, proper use, and potential adverse effects of future changes to BP medications that could impact gout. -F/u labs ordered - BMET -Counseled on lifestyle modifications for blood pressure control including reduced dietary sodium, increased exercise, adequate sleep. -Encouraged patient to check BP at home and bring log of readings to next visit. Counseled on proper use of  home BP cuff.   Hyperlipidemia diagnosed in 2015 according to Epic currently uncontrolled with last LDL (03/2020) of 101 mg/dL not at goal of < 70 mg/dL .   - Continued atorvastatin 40 mg daily -Lipid panel today.   Gout diagnosed years ago, currently controlled, last flare over 1 year ago. Last Uric Acid (2016) was 8.3 mg/dL.  - F/u labs ordered - Uric Acid  Results reviewed and written information provided.    Written patient instructions provided. Patient verbalized understanding of treatment plan.  Total time in face to face counseling 26 minutes.    Follow-up:  Pharmacist 07/22/2023. PCP clinic visit in PRN.  Patient seen with Shona Simpson, PharmD Candidate.

## 2023-07-01 NOTE — Assessment & Plan Note (Signed)
Hyperlipidemia diagnosed in 2015 according to Epic currently uncontrolled with last LDL (03/2020) of 101 mg/dL not at goal of < 70 mg/dL .   - Continued atorvastatin 40 mg daily -Lipid panel today.  - F/u labs ordered - Uric Acid

## 2023-07-01 NOTE — Assessment & Plan Note (Signed)
Hypertension diagnosed in 1980s per Pt, currently uncontrolled on current medications. BP goal < 130/80 mmHg. Medication adherence appears good. Control is suboptimal due to suboptimal medication regimen - worsened control since stopping Thiazide.  - No medication changes - pending lab values, consider additional agent - possibly restart low dose hydrochlorothiazide -Patient educated on purpose, proper use, and potential adverse effects of future changes to BP medications that could impact gout. -F/u labs ordered - BMET -Counseled on lifestyle modifications for blood pressure control including reduced dietary sodium, increased exercise, adequate sleep. -Encouraged patient to check BP at home and bring log of readings to next visit. Counseled on proper use of home BP cuff.

## 2023-07-01 NOTE — Patient Instructions (Signed)
It was nice to see you today!  Your goal blood pressure is  <130/80 mmHg.  Medication Changes: Continue all medication the same.   Dr. Raymondo Band will follow-up with lab results.  Monitor blood pressure at home daily and keep a log (on your phone or piece of paper) to bring with you to your next visit. Write down date, time, blood pressure and pulse.  Keep up the good work with diet and exercise. Aim for a diet full of vegetables, fruit and lean meats (chicken, [place], fish). Try to limit salt intake by eating fresh or frozen vegetables (instead of canned), rinse canned vegetables prior to cooking and do not add any additional salt to meals.

## 2023-07-02 LAB — LIPID PANEL
Chol/HDL Ratio: 3 {ratio} (ref 0.0–5.0)
Cholesterol, Total: 118 mg/dL (ref 100–199)
HDL: 40 mg/dL (ref >39)
LDL Chol Calc (NIH): 63 mg/dL (ref 0–99)
Triglycerides: 75 mg/dL (ref 0–149)
VLDL Cholesterol Cal: 15 mg/dL (ref 5–40)

## 2023-07-02 LAB — BASIC METABOLIC PANEL
BUN/Creatinine Ratio: 9 — ABNORMAL LOW (ref 10–24)
BUN: 12 mg/dL (ref 8–27)
CO2: 23 mmol/L (ref 20–29)
Calcium: 9.3 mg/dL (ref 8.6–10.2)
Chloride: 108 mmol/L — ABNORMAL HIGH (ref 96–106)
Creatinine, Ser: 1.34 mg/dL — ABNORMAL HIGH (ref 0.76–1.27)
Glucose: 107 mg/dL — ABNORMAL HIGH (ref 70–99)
Potassium: 4.4 mmol/L (ref 3.5–5.2)
Sodium: 145 mmol/L — ABNORMAL HIGH (ref 134–144)
eGFR: 54 mL/min/{1.73_m2} — ABNORMAL LOW (ref >59)

## 2023-07-02 LAB — URIC ACID: Uric Acid: 5.1 mg/dL (ref 3.8–8.4)

## 2023-07-07 ENCOUNTER — Telehealth: Payer: Self-pay

## 2023-07-07 NOTE — Telephone Encounter (Signed)
Transition Care Management Unsuccessful Follow-up Telephone Call  Date of discharge and from where:  06/05/2023 [facility]  Attempts:  1st Attempt  Reason for unsuccessful TCM follow-up call:  No answer/busy  Jamese Trauger [facility]  [facility], [facility] Resource Care Guide Direct Dial: [phone]  Website: [email or web address]

## 2023-07-08 ENCOUNTER — Telehealth: Payer: Self-pay

## 2023-07-08 NOTE — Telephone Encounter (Signed)
Transition Care Management Follow-up Telephone Call Date of discharge and from where: 06/05/2023 [facility] How have you been since you were released from the hospital? Patient stated he is feeling much better. Any questions or concerns? No  Items Reviewed: Did the pt receive and understand the discharge instructions provided? Yes  Medications obtained and verified?  No medication prescribed. Other? No  Any new allergies since your discharge? No  Dietary orders reviewed? Yes Do you have support at home? Yes   Follow up appointments reviewed:  PCP Hospital f/u appt confirmed? Yes  Scheduled to see Kathrin Ruddy, RPH-CPP on 06/10/2023 @ [facility] f/u appt confirmed? No  Scheduled to see  on  @ . Are transportation arrangements needed? No  If their condition worsens, is the pt aware to call PCP or go to the Emergency Dept.? Yes Was the patient provided with contact information for the PCP's office or ED? Yes Was to pt encouraged to call back with questions or concerns? Yes   Laira Penninger [facility]  [facility], [facility] Guide Direct Dial: [phone]  Website: [email or web address]

## 2023-07-22 ENCOUNTER — Ambulatory Visit: Payer: Medicare Other | Admitting: Pharmacist

## 2023-07-22 ENCOUNTER — Encounter: Payer: Self-pay | Admitting: Pharmacist

## 2023-07-22 VITALS — BP 139/79 | HR 83 | Wt 188.8 lb

## 2023-07-22 DIAGNOSIS — E782 Mixed hyperlipidemia: Secondary | ICD-10-CM

## 2023-07-22 DIAGNOSIS — M109 Gout, unspecified: Secondary | ICD-10-CM | POA: Diagnosis not present

## 2023-07-22 DIAGNOSIS — N529 Male erectile dysfunction, unspecified: Secondary | ICD-10-CM

## 2023-07-22 DIAGNOSIS — I10 Essential (primary) hypertension: Secondary | ICD-10-CM | POA: Diagnosis not present

## 2023-07-22 MED ORDER — COLCHICINE 0.6 MG PO CAPS
1.0000 | ORAL_CAPSULE | ORAL | 0 refills | Status: AC | PRN
Start: 2023-07-22 — End: ?

## 2023-07-22 MED ORDER — TADALAFIL 20 MG PO TABS: ORAL_TABLET | ORAL | 0 refills | Status: DC

## 2023-07-22 MED ORDER — LOSARTAN POTASSIUM-HCTZ 100-12.5 MG PO TABS: 1.0000 | ORAL_TABLET | Freq: Every day | ORAL | 3 refills | Status: DC

## 2023-07-22 NOTE — Assessment & Plan Note (Signed)
Hypertension diagnosed in 1980s per Pt currently uncontrolled on current medications. BP goal < 130/80  mmHg. Medication adherence appears good, takes meds every AM. Control is suboptimal due to suboptimal medication regimen. Patient's current Uric acid of 5.1 mg/dL and no recent history of gout flares, along with blood pressure readings warranted restarting low-dose hydrochlorothiazide.  - Started losartan-hydrochlorothiazide 100-12.5 mg once daily once patient finishes losartan 100 mg supply -Patient educated on purpose, proper use, and potential adverse effects of increased gout flares, discussed calling if any flares occur.  - At next visit check Uric Acid following thiazide restart at low dose.  -Counseled on lifestyle modifications for blood pressure control including reduced dietary sodium, increased exercise, adequate sleep. -Encouraged patient to check BP at home and bring log of readings to next visit. Counseled on proper use of home BP cuff.

## 2023-07-22 NOTE — Patient Instructions (Addendum)
It was nice to see you today!  Your goal blood pressure is <130/80 mmHg.  Medication Changes: START losartan-hydrochlorothiazide 100-12.5 mg once daily after you finish losartan 100 mg on hand  Continue all other medication the same.   Monitor blood pressure at home daily and keep a log (on your phone or piece of paper) to bring with you to your next visit. Write down date, time, blood pressure and pulse.  Keep up the good work with diet and exercise. Aim for a diet full of vegetables, fruit and lean meats (chicken, [place], fish). Try to limit salt intake by eating fresh or frozen vegetables (instead of canned), rinse canned vegetables prior to cooking and do not add any additional salt to meals.

## 2023-07-22 NOTE — Assessment & Plan Note (Signed)
History of stroke, hyperlipidemia.  Recently switched from simvastatin to atorvastatin. Appears to be tolerating well.  Recent LDL controlled < 70mg /dl.  - Continue atorvastatin

## 2023-07-22 NOTE — Progress Notes (Signed)
S:     Chief Complaint  Patient presents with   Medication Management    HTN F/U - Restart hydrochlorothiazide    77 y.o. male who presents for hypertension evaluation, education, and management.  PMH is significant for HTN, HLD, Gout.  Patient was referred and last seen by Primary Care Provider, Dr. Laroy Apple, on 05/23/2023.   At last visit, Pt was continued on losartan 100mg  daily and amlodipine 10mg  daily due to history of gout and associated risk of flares. Also discontinued simvastatin 40 mg daily, due to drug interaction with colchicine, which Pt takes occasionally for gout flares but has not had a flare in over a year, as well as drug interaction between simvastatin and amlodipine. Started atorvastatin 40 mg daily.    Today, patient arrives in good spirits and presents without assistance. Denies dizziness, headache, blurred vision, swelling. Reports occasional foot swelling in AM.   Patient reports hypertension was diagnosed in ~40 years ago, reported he had a heart attack in the late 1980s and has been on medication ever since.   Family/Social history: None  Medication adherence good, takes meds early in the AM . Patient has taken BP medications today.   Current antihypertensives include: amlodipine 10 mg once daily, losartan 100 mg once daily  Antihypertensives tried in the past include: lisinopril-hydrochlorothiazide 20-25 mg once daily - stopped due to risk for gout  Reported home BP readings: None   Eats 1-2 meals/day - eats lots of veggies, chicken, some pork, eats lots of fruit, grits in the morning Drinks: water, soda occasionally  Patient-reported exercise habits: works M,W,S cleaning a church for exercise, walks at home up and down stairs  O:  Review of Systems  All other systems reviewed and are negative.   Physical Exam Vitals reviewed.  Constitutional:      Appearance: Normal appearance.  Pulmonary:     Effort: Pulmonary effort is normal.   Musculoskeletal:     Right lower leg: No edema.     Left lower leg: No edema.  Neurological:     Mental Status: He is alert.  Psychiatric:        Mood and Affect: Mood normal.        Behavior: Behavior normal.        Thought Content: Thought content normal.        Judgment: Judgment normal.    Last 3 Office BP readings: BP Readings from Last 3 Encounters:  07/22/23 139/79  07/01/23 (!) 151/78  06/10/23 127/69    BMET    Component Value Date/Time   NA 145 (H) 07/01/2023 1227   K 4.4 07/01/2023 1227   CL 108 (H) 07/01/2023 1227   CO2 23 07/01/2023 1227   GLUCOSE 107 (H) 07/01/2023 1227   GLUCOSE 114 (H) 06/05/2023 1847   BUN 12 07/01/2023 1227   CREATININE 1.34 (H) 07/01/2023 1227   CREATININE 1.34 (H) 09/04/2016 1054   CALCIUM 9.3 07/01/2023 1227   GFRNONAA 46 (L) 06/05/2023 1847   GFRAA 60 03/27/2020 1044    Renal function: Estimated Creatinine Clearance: 45.5 mL/min (A) (by C-G formula based on SCr of 1.34 mg/dL (H)).  Clinical ASCVD:  Yes  - Per patient report diagnosed with MI in 1980s  The ASCVD Risk score (Arnett DK, et al., 2019) failed to calculate for the following reasons:   The valid total cholesterol range is 130 to 320 mg/dL   A/P: Hypertension diagnosed in 1980s per Pt currently uncontrolled on current medications. BP  goal < 130/80  mmHg. Medication adherence appears good, takes meds every AM. Control is suboptimal due to suboptimal medication regimen. Patient's current Uric acid of 5.1 mg/dL and no recent history of gout flares, along with blood pressure readings warranted restarting low-dose hydrochlorothiazide.  - Started losartan-hydrochlorothiazide 100-12.5 mg once daily once patient finishes losartan 100 mg supply -Patient educated on purpose, proper use, and potential adverse effects of increased gout flares, discussed calling if any flares occur.  - At next visit check Uric Acid following thiazide restart at low dose.  -Counseled on lifestyle  modifications for blood pressure control including reduced dietary sodium, increased exercise, adequate sleep. -Encouraged patient to check BP at home and bring log of readings to next visit. Counseled on proper use of home BP cuff.   History of stroke, hyperlipidemia.  Recently switched from simvastatin to atorvastatin. Appears to be tolerating well.  Recent LDL controlled < 70mg /dl.  - Continue atorvastatin  Results reviewed and written information provided.    Refills for colchicine and cialis (tadalafil) provided.  Both medications working effectively and using appropriately PRN.    Written patient instructions provided. Patient verbalized understanding of treatment plan.  Total time in face to face counseling 26 minutes.    Follow-up:  Pharmacist 08/19/2023. PCP clinic visit in PRN.  Patient seen with Lendon Ka, PharmD Candidate and Shona Simpson, PharmD Candidate.

## 2023-07-23 NOTE — Progress Notes (Signed)
Reviewed and agree with Dr Koval's plan.   

## 2023-08-05 ENCOUNTER — Other Ambulatory Visit: Payer: Self-pay | Admitting: Student

## 2023-08-05 DIAGNOSIS — I10 Essential (primary) hypertension: Secondary | ICD-10-CM

## 2023-08-10 IMAGING — DX DG CHEST 2V
2 series · 2 of 2 positions shown · non-contrast
Comparison: September 27, 2016.

CLINICAL DATA: Cardiac device in-situ.

EXAM:
CHEST - 2 VIEW

[w chest pa]
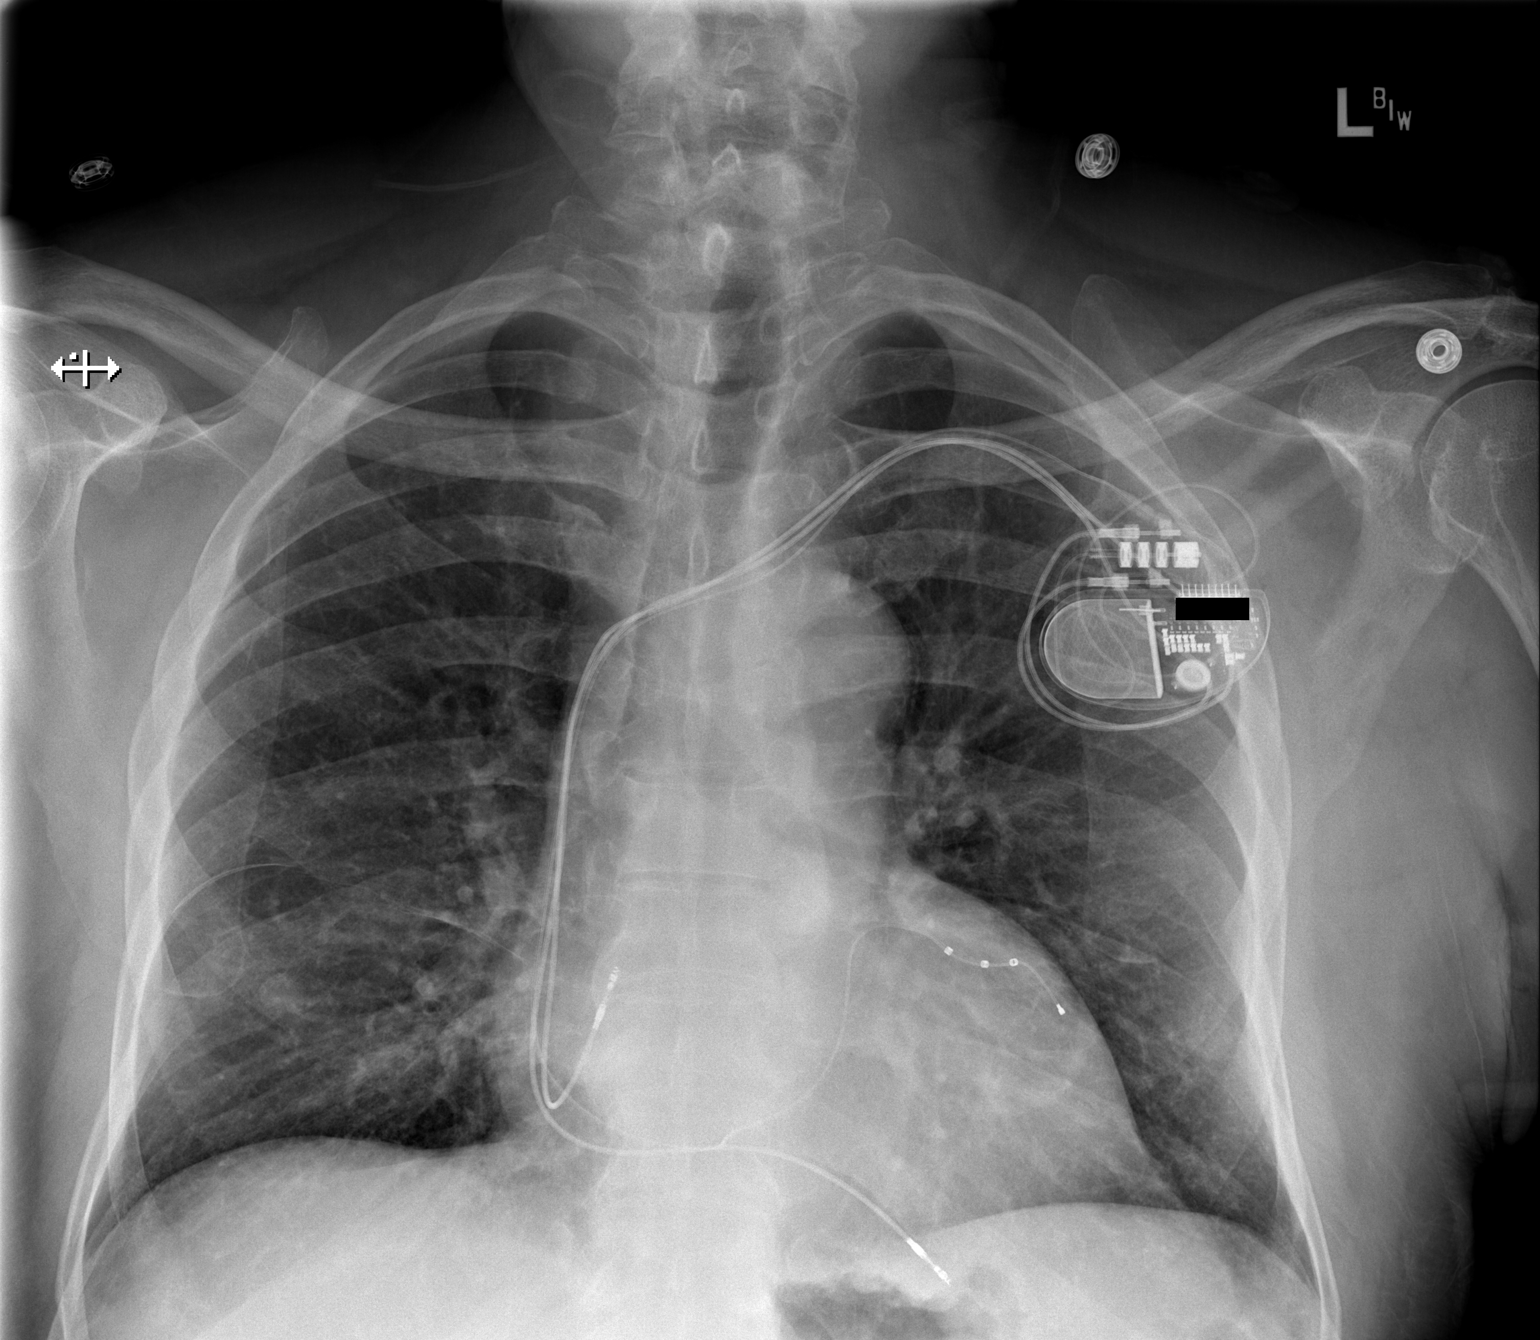

[w chest lat]
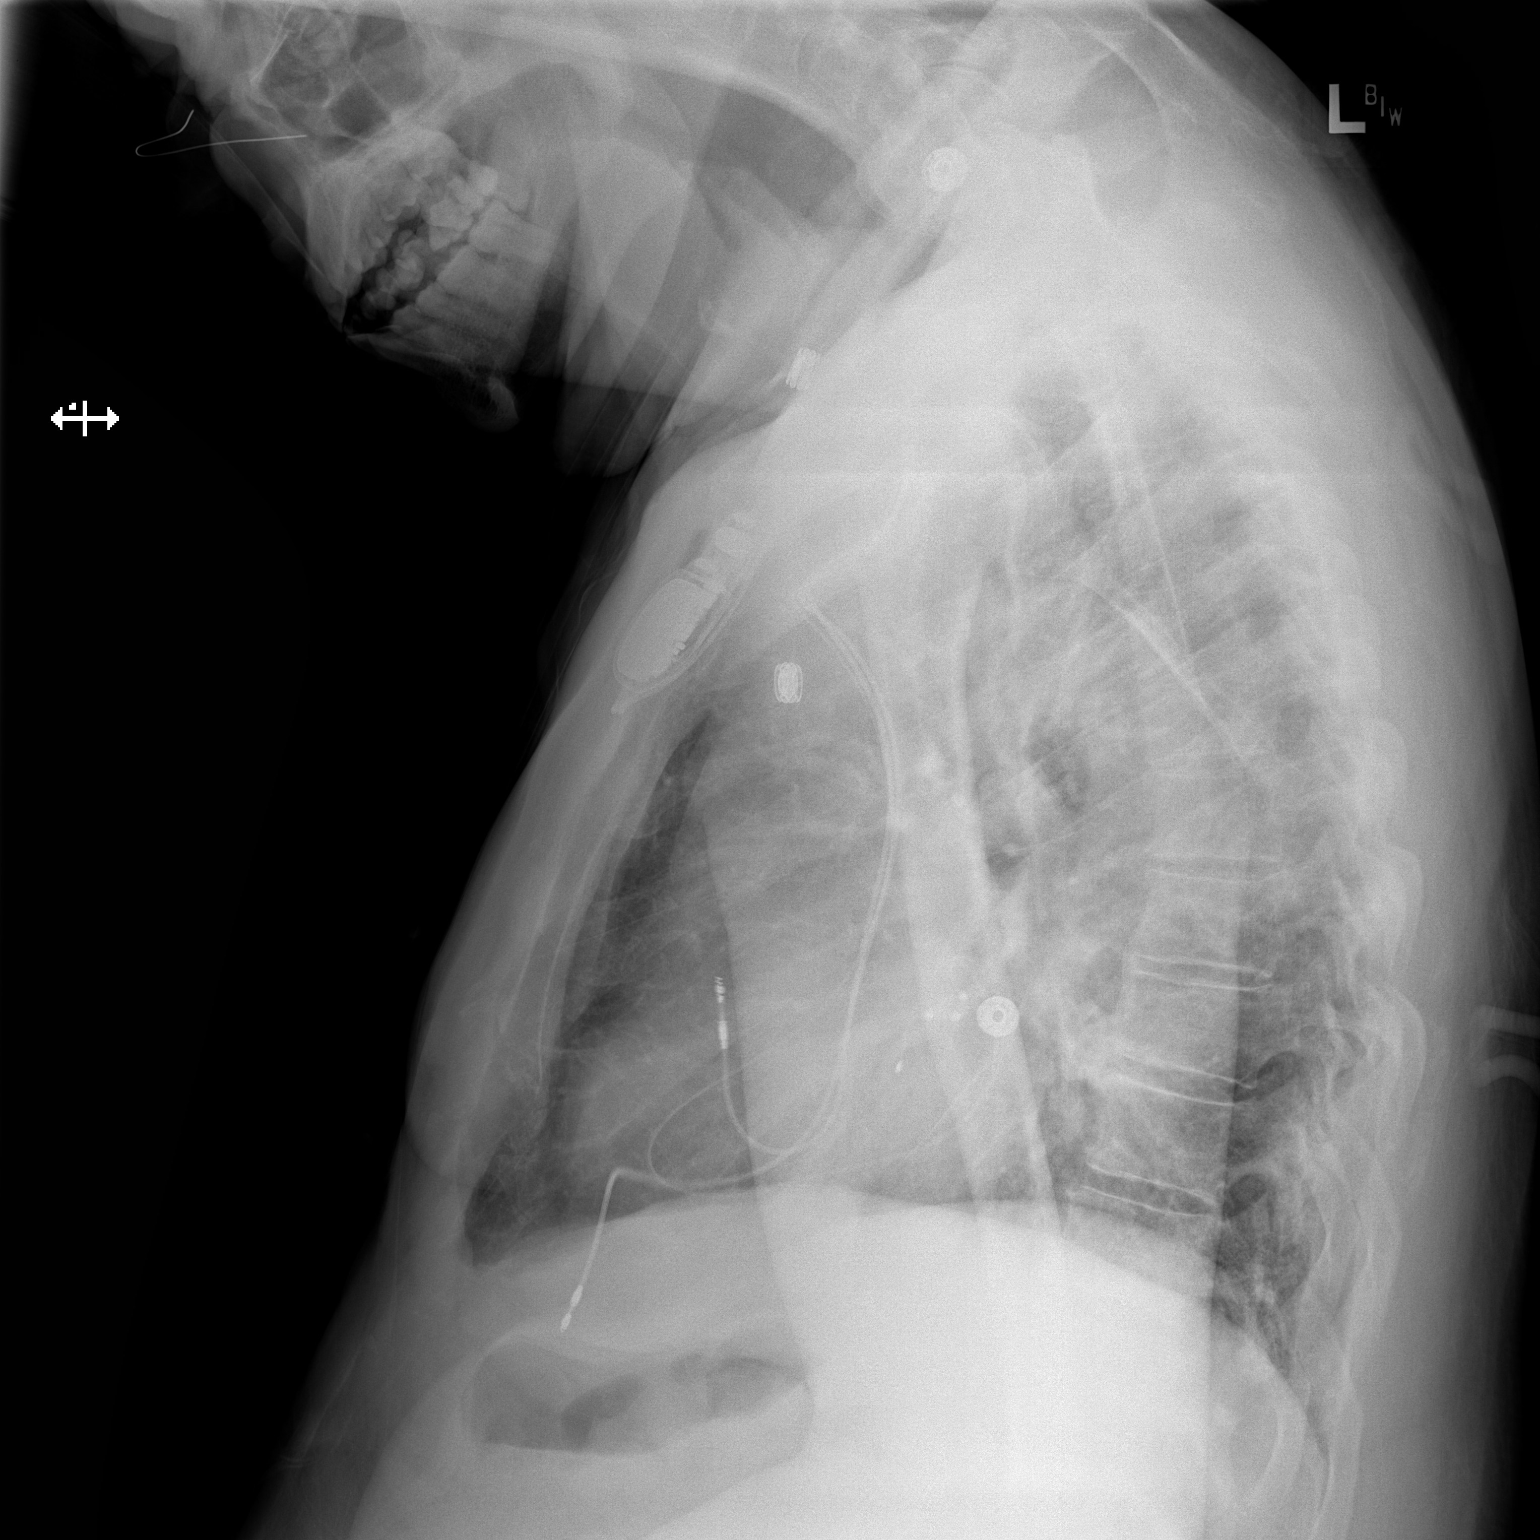

[2 of 2 positions shown; findings below may reference images not displayed]

FINDINGS: The heart size and mediastinal contours are within normal limits.
Both lungs are clear. Left-sided pacemaker is unchanged in position.
The visualized skeletal structures are unremarkable.
IMPRESSION: No active cardiopulmonary disease.

## 2023-08-18 ENCOUNTER — Other Ambulatory Visit: Payer: Self-pay

## 2023-08-18 DIAGNOSIS — N400 Enlarged prostate without lower urinary tract symptoms: Secondary | ICD-10-CM

## 2023-08-18 MED ORDER — TAMSULOSIN HCL 0.4 MG PO CAPS
0.4000 mg | ORAL_CAPSULE | Freq: Every day | ORAL | 3 refills | Status: DC
Start: 2023-08-18 — End: 2023-12-23

## 2023-08-19 ENCOUNTER — Ambulatory Visit: Payer: Medicare Other | Admitting: Pharmacist

## 2023-08-21 ENCOUNTER — Ambulatory Visit (INDEPENDENT_AMBULATORY_CARE_PROVIDER_SITE_OTHER): Payer: Medicare Other

## 2023-08-21 DIAGNOSIS — I442 Atrioventricular block, complete: Secondary | ICD-10-CM

## 2023-08-21 LAB — CUP PACEART REMOTE DEVICE CHECK
Battery Remaining Longevity: 65 mo
Battery Remaining Percentage: 80 %
Battery Voltage: 2.99 V
Brady Statistic AP VP Percent: 32 %
Brady Statistic AP VS Percent: 1 %
Brady Statistic AS VP Percent: 68 %
Brady Statistic AS VS Percent: 1 %
Brady Statistic RA Percent Paced: 32 %
Date Time Interrogation Session: 20241205020008
Implantable Lead Connection Status: 753985
Implantable Lead Connection Status: 753985
Implantable Lead Connection Status: 753985
Implantable Lead Implant Date: 20131118
Implantable Lead Implant Date: 20131118
Implantable Lead Implant Date: 20230308
Implantable Lead Location: 753858
Implantable Lead Location: 753859
Implantable Lead Location: 753860
Implantable Pulse Generator Implant Date: 20230308
Lead Channel Impedance Value: 300 Ohm
Lead Channel Impedance Value: 400 Ohm
Lead Channel Impedance Value: 930 Ohm
Lead Channel Pacing Threshold Amplitude: 0.5 V
Lead Channel Pacing Threshold Amplitude: 0.75 V
Lead Channel Pacing Threshold Amplitude: 1.875 V
Lead Channel Pacing Threshold Pulse Width: 0.5 ms
Lead Channel Pacing Threshold Pulse Width: 0.5 ms
Lead Channel Pacing Threshold Pulse Width: 0.5 ms
Lead Channel Sensing Intrinsic Amplitude: 11.3 mV
Lead Channel Sensing Intrinsic Amplitude: 4.9 mV
Lead Channel Setting Pacing Amplitude: 1.75 V
Lead Channel Setting Pacing Amplitude: 2 V
Lead Channel Setting Pacing Amplitude: 2.875
Lead Channel Setting Pacing Pulse Width: 0.5 ms
Lead Channel Setting Pacing Pulse Width: 0.5 ms
Lead Channel Setting Sensing Sensitivity: 4 mV
Pulse Gen Model: 3562
Pulse Gen Serial Number: 8062726

## 2023-08-26 ENCOUNTER — Telehealth: Payer: Self-pay | Admitting: Pharmacist

## 2023-08-26 NOTE — Telephone Encounter (Signed)
Attempted to contact patient for follow-up of missed Hypertension follow-up visit on 08/19/2023   Appears patient pick-up in late November was for losartan and NOT losartan-hydrochlorothiazide as previously prescribed.  Contacted to determine if patient would be willing to reschedule.  Unavailable.   Total time with patient call and documentation of interaction: 8 minutes.  Follow-up phone call planned: None

## 2023-09-02 ENCOUNTER — Ambulatory Visit: Payer: Medicare Other | Admitting: Pharmacist

## 2023-09-02 ENCOUNTER — Encounter: Payer: Self-pay | Admitting: Pharmacist

## 2023-09-02 VITALS — BP 153/76 | HR 72 | Wt 186.0 lb

## 2023-09-02 DIAGNOSIS — I10 Essential (primary) hypertension: Secondary | ICD-10-CM | POA: Diagnosis not present

## 2023-09-02 DIAGNOSIS — M109 Gout, unspecified: Secondary | ICD-10-CM

## 2023-09-02 NOTE — Patient Instructions (Signed)
It was nice to see you today!  Your goal blood pressure is 140 mmHg.  Medication Changes: Continue all other medication the same. NO changes.  Following the lab today, I will send higher dose losartan hydrochlorothiazide combination.  START this in ~ 3 weeks.   Keep up the good work with diet and exercise. Aim for a diet full of vegetables, fruit and lean meats (chicken, [place], fish). Try to limit salt intake by eating fresh or frozen vegetables (instead of canned), rinse canned vegetables prior to cooking and do not add any additional salt to meals.

## 2023-09-02 NOTE — Assessment & Plan Note (Signed)
Hypertension diagnosed in the 1980s per patient report.  Currently remains uncontrolled on current medications. BP goal < 130  mmHg systolic if tolerated. Medication adherence appears good, takes meds every AM. Control remains suboptimal due to suboptimal medication regimen. Patient picked up most recent prescription for losartan hydrochlorothiazide 1 weeks ago. Patient's current uric acid was 5.1 mg/dL when assessed prior to restart of hydrochlorothiazide - check again today,  If uric acid remains < 6 then plan to increase hydrochlorothiazide dose with next prescription.  - Continued amlodipine 10mg  daily - Continued losartan-hydrochlorothiazide 100-12.5 mg daily until patient finishes current supply.  - Check Uric Acid following recent thiazide restart at low dose Consider dose increase of thiazide to 25mg  if uric acid < 6

## 2023-09-02 NOTE — Progress Notes (Signed)
   S:     Chief Complaint  Patient presents with   Medication Management    Blood Pressure F/U   77 y.o. male who presents for hypertension evaluation, education, and management.  PMH is significant for hypertension and history of gout.   Patient was referred and last seen by Primary Care Provider, Dr. Laroy Apple, on 05/23/2023.   At last visit, three drug regimen was resumed with use of low dose hydrochlorothiazide.   Medication adherence reported as good . Patient reports he has taken BP medications today.   Current antihypertensives include: amlodipine 10mg  daily, losartan/hydrochlorothiazide 100mg /12.5mg  daily  Reported home BP readings: not checking at home.    O:  Review of Systems  Neurological:  Negative for dizziness and headaches.  All other systems reviewed and are negative.   Physical Exam Vitals reviewed.  Constitutional:      Appearance: Normal appearance.  Pulmonary:     Effort: Pulmonary effort is normal.  Neurological:     Mental Status: He is alert.  Psychiatric:        Mood and Affect: Mood normal.        Behavior: Behavior normal.        Thought Content: Thought content normal.     Last 3 Office BP readings: BP Readings from Last 3 Encounters:  09/02/23 (!) 153/76  07/22/23 139/79  07/01/23 (!) 151/78    BMET    Component Value Date/Time   NA 145 (H) 07/01/2023 1227   K 4.4 07/01/2023 1227   CL 108 (H) 07/01/2023 1227   CO2 23 07/01/2023 1227   GLUCOSE 107 (H) 07/01/2023 1227   GLUCOSE 114 (H) 06/05/2023 1847   BUN 12 07/01/2023 1227   CREATININE 1.34 (H) 07/01/2023 1227   CREATININE 1.34 (H) 09/04/2016 1054   CALCIUM 9.3 07/01/2023 1227   GFRNONAA 46 (L) 06/05/2023 1847   GFRAA 60 03/27/2020 1044    Renal function: CrCl cannot be calculated (Patient's most recent lab result is older than the maximum 21 days allowed.).  Clinical ASCVD: Yes  The ASCVD Risk score (Arnett DK, et al., 2019) failed to calculate for the following  reasons:   The valid total cholesterol range is 130 to 320 mg/dL   A/P: Hypertension diagnosed in the 1980s per patient report.  Currently remains uncontrolled on current medications. BP goal < 130  mmHg systolic if tolerated. Medication adherence appears good, takes meds every AM. Control remains suboptimal due to suboptimal medication regimen. Patient picked up most recent prescription for losartan hydrochlorothiazide 1 weeks ago. Patient's current uric acid was 5.1 mg/dL when assessed prior to restart of hydrochlorothiazide - check again today,  If uric acid remains < 6 then plan to increase hydrochlorothiazide dose with next prescription.  - Continued amlodipine 10mg  daily - Continued losartan-hydrochlorothiazide 100-12.5 mg daily until patient finishes current supply.  - Check Uric Acid following recent thiazide restart at low dose Consider dose increase of thiazide to 25mg  if uric acid < 6   Results reviewed and written information provided.    Written patient instructions provided. Patient verbalized understanding of treatment plan.  Total time in face to face counseling 17 minutes.    Follow-up:  Pharmacist 4 weeks. Marland Kitchen PCP clinic visit in PRN.

## 2023-09-03 LAB — BASIC METABOLIC PANEL
BUN/Creatinine Ratio: 13 (ref 10–24)
BUN: 15 mg/dL (ref 8–27)
CO2: 20 mmol/L (ref 20–29)
Calcium: 9.5 mg/dL (ref 8.6–10.2)
Chloride: 109 mmol/L — ABNORMAL HIGH (ref 96–106)
Creatinine, Ser: 1.19 mg/dL (ref 0.76–1.27)
Glucose: 107 mg/dL — ABNORMAL HIGH (ref 70–99)
Potassium: 3.7 mmol/L (ref 3.5–5.2)
Sodium: 144 mmol/L (ref 134–144)
eGFR: 62 mL/min/{1.73_m2} (ref >59)

## 2023-09-03 LAB — URIC ACID: Uric Acid: 4.6 mg/dL (ref 3.8–8.4)

## 2023-09-04 NOTE — Progress Notes (Signed)
Reviewed and agree with Dr Koval's plan.   

## 2023-09-05 ENCOUNTER — Telehealth: Payer: Self-pay | Admitting: Pharmacist

## 2023-09-05 MED ORDER — LOSARTAN POTASSIUM-HCTZ 100-25 MG PO TABS: 1.0000 | ORAL_TABLET | Freq: Every day | ORAL | 11 refills | Status: DC

## 2023-09-05 NOTE — Telephone Encounter (Signed)
-----   Message from [facility] McDiarmid sent at 09/04/2023  2:10 PM EST -----  ----- Message ----- From: Nell Range Lab Results In Sent: 09/03/2023   8:13 AM EST To: Leighton Roach McDiarmid, MD

## 2023-09-05 NOTE — Telephone Encounter (Signed)
Attempted to contact patient for follow-up of lab results  2 attempts to both phone number contacts   Left HIPAA compliant voice mail requesting call back to direct phone: [phone] Sharing results of tests were good - unchanged Continue current losartan hydrochlorothiazide until gone was plan discussed at visit. Unchanged.   Plan will be to increase hydrochlorothiazide component of losartan/hydrochlorothiazide combination at next fill.   New Rx sent to his pharmacy.    Total time with patient call and documentation of interaction: 12 minutes.

## 2023-09-05 NOTE — Telephone Encounter (Signed)
Reviewed and agree with Dr Koval's plan.   

## 2023-09-11 ENCOUNTER — Other Ambulatory Visit: Payer: Self-pay | Admitting: Family Medicine

## 2023-09-11 DIAGNOSIS — N529 Male erectile dysfunction, unspecified: Secondary | ICD-10-CM

## 2023-10-02 ENCOUNTER — Other Ambulatory Visit: Payer: Self-pay | Admitting: Student

## 2023-10-02 DIAGNOSIS — I10 Essential (primary) hypertension: Secondary | ICD-10-CM

## 2023-10-07 ENCOUNTER — Ambulatory Visit: Payer: Medicare Other | Admitting: Pharmacist

## 2023-10-07 ENCOUNTER — Encounter: Payer: Self-pay | Admitting: Pharmacist

## 2023-10-07 VITALS — BP 136/67 | HR 72 | Wt 185.0 lb

## 2023-10-07 DIAGNOSIS — I10 Essential (primary) hypertension: Secondary | ICD-10-CM | POA: Diagnosis not present

## 2023-10-07 DIAGNOSIS — M109 Gout, unspecified: Secondary | ICD-10-CM

## 2023-10-07 DIAGNOSIS — E782 Mixed hyperlipidemia: Secondary | ICD-10-CM | POA: Diagnosis not present

## 2023-10-07 MED ORDER — ATORVASTATIN CALCIUM 80 MG PO TABS
80.0000 mg | ORAL_TABLET | Freq: Every day | ORAL | 3 refills | Status: DC
Start: 2023-10-07 — End: 2024-06-08

## 2023-10-07 NOTE — Assessment & Plan Note (Signed)
Hypertension with blood pressure improved from last visit on current medications. BP goal < 130/80 mmHg. Medication adherence appears good. Control is improved with the restart of low-dose thiazide.   -Continued amlodipine 10 mg daily.  -Continued losartan-hydrochlorothiazide 100-25 mg daily.  (Removed 100-12.5mg  dose from patient possession and sent for destruction). -F/u labs ordered - uric acid and BMET today -Patient educated on purpose, proper use, and potential adverse effects.  -Counseled on lifestyle modifications for blood pressure control including reduced dietary sodium, increased exercise, adequate sleep. -Encouraged patient to check BP at home and bring log of readings to next visit. Counseled on proper use of home BP cuff.

## 2023-10-07 NOTE — Progress Notes (Signed)
S:     Chief Complaint  Patient presents with   Medication Management    BP F/U   78 y.o. male who presents for hypertension evaluation, education, and management.   PMH is significant for HLD, gout.   Patient was referred and last seen by Primary Care Provider, Dr. Laroy Apple, on 05/23/23.   At last visit, continued amlodipine 10 mg and losartan-hydrochlorothiazide 100-12.5 with plan to check uric acid at next visit with recent low-dose thiazide restart.   Today, patient arrives in good spirits and presents without any assistance. Denies dizziness, headache, blurred vision, swelling.   Medication adherence appears good. Patient has taken BP medications today.   Current antihypertensives include: amlodipine 10 mg daily, losartan-hydrochlorothiazide 100-12.5 mg daily   O:  Review of Systems  Neurological:  Negative for dizziness.  All other systems reviewed and are negative.   Physical Exam Vitals reviewed.  Constitutional:      Appearance: Normal appearance.  Pulmonary:     Effort: Pulmonary effort is normal.  Neurological:     General: No focal deficit present.     Mental Status: He is alert.  Psychiatric:        Mood and Affect: Mood normal.        Behavior: Behavior normal.        Thought Content: Thought content normal.        Judgment: Judgment normal.     Last 3 Office BP readings: BP Readings from Last 3 Encounters:  10/07/23 136/67  09/02/23 (!) 153/76  07/22/23 139/79    BMET    Component Value Date/Time   NA 144 09/02/2023 0909   K 3.7 09/02/2023 0909   CL 109 (H) 09/02/2023 0909   CO2 20 09/02/2023 0909   GLUCOSE 107 (H) 09/02/2023 0909   GLUCOSE 114 (H) 06/05/2023 1847   BUN 15 09/02/2023 0909   CREATININE 1.19 09/02/2023 0909   CREATININE 1.34 (H) 09/04/2016 1054   CALCIUM 9.5 09/02/2023 0909   GFRNONAA 46 (L) 06/05/2023 1847   GFRAA 60 03/27/2020 1044    Renal function: CrCl cannot be calculated (Patient's most recent lab result is  older than the maximum 21 days allowed.).  Clinical ASCVD: Yes  The ASCVD Risk score (Arnett DK, et al., 2019) failed to calculate for the following reasons:   The valid total cholesterol range is 130 to 320 mg/dL   A/P: Hypertension with blood pressure improved from last visit on current medications. BP goal < 130/80 mmHg. Medication adherence appears good. Control is improved with the restart of low-dose thiazide.   -Continued amlodipine 10 mg daily.  -Continued losartan-hydrochlorothiazide 100-25 mg daily.  (Removed 100-12.5mg  dose from patient possession and sent for destruction). -F/u labs ordered - uric acid and BMET today -Patient educated on purpose, proper use, and potential adverse effects.  -Counseled on lifestyle modifications for blood pressure control including reduced dietary sodium, increased exercise, adequate sleep. -Encouraged patient to check BP at home and bring log of readings to next visit. Counseled on proper use of home BP cuff.   ASCVD - in patient with hypertension and hyperlipidemia. Last LDL is improved at 63, but not at goal of <55 mg/dL.  -increased atorvastatin (Lipitor) from 40 mg to 80 mg daily.  History of Gout - with hydrochlorothiazide therapy for hypertension.  Repeat uric acid today post dose increase of hydrochlorothiazide to 25mg  at last visit.    Results reviewed and written information provided.    Written patient instructions provided.  Patient verbalized understanding of treatment plan.  Total time in face to face counseling 33 minutes.    Follow-up:  Pharmacist TBD. PCP clinic visit in 12/09/23.  Patient seen with Lavona Mound, PharmD Candidate and Laqueta Jean, PharmD Candidate.  Uric acid is now 6.8 since increasing dose of hydrochlorothiazide in his combination antihypertensive regimen. Consider starting low dose allopurinol 100mg  at next visit.

## 2023-10-07 NOTE — Assessment & Plan Note (Signed)
ASCVD - in patient with hypertension and hyperlipidemia. Last LDL is improved at 63, but not at goal of <55 mg/dL.  -increased atorvastatin (Lipitor) from 40 mg to 80 mg daily.

## 2023-10-07 NOTE — Patient Instructions (Addendum)
It was nice to see you today!  Your goal blood pressure is <130/80.  Medication Changes: INCREASE Atorvastatin (Lipitor) from 40 mg to 80 mg daily.  Continue all other medication the same.    Monitor blood pressure at home daily and keep a log (on your phone or piece of paper) to bring with you to your next visit. Write down date, time, blood pressure and pulse.  Keep up the good work with diet and exercise. Aim for a diet full of vegetables, fruit and lean meats (chicken, [place], fish). Try to limit salt intake by eating fresh or frozen vegetables (instead of canned), rinse canned vegetables prior to cooking and do not add any additional salt to meals.

## 2023-10-08 ENCOUNTER — Telehealth: Payer: Self-pay | Admitting: Pharmacist

## 2023-10-08 DIAGNOSIS — M109 Gout, unspecified: Secondary | ICD-10-CM

## 2023-10-08 LAB — BASIC METABOLIC PANEL
BUN/Creatinine Ratio: 12 (ref 10–24)
BUN: 20 mg/dL (ref 8–27)
CO2: 25 mmol/L (ref 20–29)
Calcium: 9.1 mg/dL (ref 8.6–10.2)
Chloride: 101 mmol/L (ref 96–106)
Creatinine, Ser: 1.62 mg/dL — ABNORMAL HIGH (ref 0.76–1.27)
Glucose: 118 mg/dL — ABNORMAL HIGH (ref 70–99)
Potassium: 3.5 mmol/L (ref 3.5–5.2)
Sodium: 141 mmol/L (ref 134–144)
eGFR: 43 mL/min/{1.73_m2} — ABNORMAL LOW (ref >59)

## 2023-10-08 LAB — URIC ACID: Uric Acid: 6.8 mg/dL (ref 3.8–8.4)

## 2023-10-08 NOTE — Assessment & Plan Note (Signed)
Uric acid is now 6.8 since increasing dose of hydrochlorothiazide in his combination antihypertensive regimen.  Consider starting low dose allopurinol 100mg  at next visit.

## 2023-10-08 NOTE — Progress Notes (Signed)
Reviewed and agree with Dr Koval's plan.   

## 2023-10-08 NOTE — Telephone Encounter (Signed)
Attempted to contact patient for follow-up of lab results.  Multiple without success to talk directly with patient.   Left HIPAA compliant voice mail.  Shared that lab details and results could be followed-up at next PCP appointment.  Encouraged to keep appointment with Dr. Laroy Apple scheduled yesterday.   Total time with patient call and documentation of interaction: 9 minutes.   Uric acid is now 6.8 since increasing dose of hydrochlorothiazide in his combination antihypertensive regimen. Consider starting low dose allopurinol 100mg  at next visit.

## 2023-10-08 NOTE — Telephone Encounter (Signed)
-----   Message from [facility] McDiarmid sent at 10/08/2023  8:37 AM EST -----  ----- Message ----- From: Nell Range Lab Results In Sent: 10/08/2023   8:13 AM EST To: Leighton Roach McDiarmid, MD

## 2023-10-09 NOTE — Telephone Encounter (Signed)
Reviewed and agree with Dr Koval's plan.   

## 2023-11-08 ENCOUNTER — Other Ambulatory Visit: Payer: Self-pay | Admitting: Family Medicine

## 2023-11-08 DIAGNOSIS — N529 Male erectile dysfunction, unspecified: Secondary | ICD-10-CM

## 2023-11-20 ENCOUNTER — Ambulatory Visit: Payer: Medicare Other

## 2023-11-20 DIAGNOSIS — I442 Atrioventricular block, complete: Secondary | ICD-10-CM

## 2023-11-22 LAB — CUP PACEART REMOTE DEVICE CHECK
Battery Remaining Longevity: 73 mo
Battery Remaining Percentage: 77 %
Battery Voltage: 2.99 V
Brady Statistic AP VP Percent: 31 %
Brady Statistic AP VS Percent: 1 %
Brady Statistic AS VP Percent: 68 %
Brady Statistic AS VS Percent: 1 %
Brady Statistic RA Percent Paced: 31 %
Date Time Interrogation Session: 20250306020008
Implantable Lead Connection Status: 753985
Implantable Lead Connection Status: 753985
Implantable Lead Connection Status: 753985
Implantable Lead Implant Date: 20131118
Implantable Lead Implant Date: 20131118
Implantable Lead Implant Date: 20230308
Implantable Lead Location: 753858
Implantable Lead Location: 753859
Implantable Lead Location: 753860
Implantable Pulse Generator Implant Date: 20230308
Lead Channel Impedance Value: 340 Ohm
Lead Channel Impedance Value: 430 Ohm
Lead Channel Impedance Value: 910 Ohm
Lead Channel Pacing Threshold Amplitude: 0.625 V
Lead Channel Pacing Threshold Amplitude: 0.625 V
Lead Channel Pacing Threshold Amplitude: 1.375 V
Lead Channel Pacing Threshold Pulse Width: 0.5 ms
Lead Channel Pacing Threshold Pulse Width: 0.5 ms
Lead Channel Pacing Threshold Pulse Width: 0.5 ms
Lead Channel Sensing Intrinsic Amplitude: 11.3 mV
Lead Channel Sensing Intrinsic Amplitude: 3.8 mV
Lead Channel Setting Pacing Amplitude: 1.625
Lead Channel Setting Pacing Amplitude: 2 V
Lead Channel Setting Pacing Amplitude: 2.375
Lead Channel Setting Pacing Pulse Width: 0.5 ms
Lead Channel Setting Pacing Pulse Width: 0.5 ms
Lead Channel Setting Sensing Sensitivity: 4 mV
Pulse Gen Model: 3562
Pulse Gen Serial Number: 8062726

## 2023-12-09 ENCOUNTER — Ambulatory Visit: Payer: Medicare Other | Admitting: Student

## 2023-12-09 NOTE — Progress Notes (Deleted)
    SUBJECTIVE:   CHIEF COMPLAINT / HPI:   Discussed the use of AI scribe software for clinical note transcription with the patient, who gave verbal consent to proceed.  History of Present Illness    PERTINENT  PMH / PSH: HTN, AV block, thrombocytopenia  OBJECTIVE:   There were no vitals taken for this visit.  ***  ASSESSMENT/PLAN:   Assessment & Plan    Assessment and Plan Assessment & Plan    Levin Erp, MD [facility]

## 2023-12-16 ENCOUNTER — Encounter: Payer: Self-pay | Admitting: Student

## 2023-12-16 ENCOUNTER — Ambulatory Visit: Admitting: Student

## 2023-12-16 VITALS — BP 140/77 | HR 78 | Ht 69.5 in | Wt 184.0 lb

## 2023-12-16 DIAGNOSIS — I10 Essential (primary) hypertension: Secondary | ICD-10-CM

## 2023-12-16 DIAGNOSIS — N529 Male erectile dysfunction, unspecified: Secondary | ICD-10-CM | POA: Diagnosis not present

## 2023-12-16 MED ORDER — SILDENAFIL CITRATE 25 MG PO TABS
25.0000 mg | ORAL_TABLET | Freq: Every day | ORAL | 0 refills | Status: DC | PRN
Start: 2023-12-16 — End: 2024-01-23

## 2023-12-16 NOTE — Patient Instructions (Addendum)
 It was great to see you! Thank you for allowing me to participate in your care!   Our plans for today:  - For your blood pressure please continue amlodipine 10 mg daily, losartan 100-hydrochlorothiazide 25 mg daily, and if still elevated I am going to start spironolactone -- you will need to come back in 2 weeks for lab recheck and BP check if still elevated with Dr. Raymondo Band - I am switching your cialis for viagra -- please do not use if chest pain/palpitations  Take care and seek immediate care sooner if you develop any concerns.  Levin Erp, MD

## 2023-12-16 NOTE — Progress Notes (Signed)
    SUBJECTIVE:   CHIEF COMPLAINT / HPI: BP fu  Discussed the use of AI scribe software for clinical note transcription with the patient, who gave verbal consent to proceed.  Meds of amlodipine 10 mg daily, losartan-HCTZ 100-24 mg daily  History of Present Illness The patient, with a history of hypertension and erectile dysfunction, presents for a BP follow-up. He reports compliance with his current antihypertensive regimen, which includes amlodipine, losartan, and hydrochlorothiazide, taken daily. Despite this, his blood pressure remains elevated.  Does not check his blood pressure at home.  He denies experiencing any headaches or chest pain, but notes occasional swelling in his feet and ankles.  Regarding his erectile dysfunction, the patient reports that his current medication, Cialis, is ineffective. He expresses a desire to switch to Viagra, hoping for better results. He denies any nocturia and reports that his urinary symptoms have improved with Flomax.  The patient also has a history of a myocardial infarction in the 1980s, but denies any current cardiac symptoms. He does not smoke and has not for many years.   PERTINENT  PMH / PSH: HTN, AV block, thrombocytopenia  OBJECTIVE:   BP (!) 140/77   Pulse 78   Ht 5' 9.5" (1.765 m)   Wt 184 lb (83.5 kg)   SpO2 100%   BMI 26.78 kg/m   General: Well appearing, NAD, awake, alert, responsive to questions Head: Normocephalic atraumatic CV: Regular rate and rhythm no murmurs rubs or gallops Respiratory: Clear to ausculation bilaterally, no wheezes rales or crackles, chest rises symmetrically,  no increased work of breathing   ASSESSMENT/PLAN:   Assessment & Plan Hypertension, unspecified type Recheck borderline 140/77 on maximum doses of amlodipine, losartan, and hydrochlorothiazide. - 2 week fu in pharmacy clinic scheduled - Consider starting spironolactone if hypertension persists Erectile disorder Current Cialis regimen is  ineffective despite increased dosage. He requests a switch to Viagra, which is appropriate given his well-managed cardiac status and absence of contraindications. Declines PSA testing - Prescribe Viagra   Levin Erp, MD [facility]

## 2023-12-23 ENCOUNTER — Other Ambulatory Visit: Payer: Self-pay | Admitting: Student

## 2023-12-23 DIAGNOSIS — N400 Enlarged prostate without lower urinary tract symptoms: Secondary | ICD-10-CM

## 2023-12-26 NOTE — Addendum Note (Signed)
 Addended by: Elease Etienne A on: 12/26/2023 08:35 AM   Modules accepted: Orders

## 2023-12-26 NOTE — Progress Notes (Signed)
 Remote pacemaker transmission.

## 2023-12-27 ENCOUNTER — Encounter: Payer: Self-pay | Admitting: Internal Medicine

## 2023-12-30 ENCOUNTER — Ambulatory Visit: Admitting: Pharmacist

## 2024-01-01 ENCOUNTER — Telehealth: Payer: Self-pay | Admitting: Pharmacist

## 2024-01-01 NOTE — Telephone Encounter (Signed)
 Attempted to contact patient for follow-up of missed appointment.  Left HIPAA compliant voice mail requesting call back to direct phone: [phone]  Total time with patient call and documentation of interaction: 2 minutes.

## 2024-01-02 ENCOUNTER — Other Ambulatory Visit: Payer: Self-pay | Admitting: Student

## 2024-01-02 DIAGNOSIS — I10 Essential (primary) hypertension: Secondary | ICD-10-CM

## 2024-01-23 ENCOUNTER — Other Ambulatory Visit: Payer: Self-pay | Admitting: Student

## 2024-01-23 DIAGNOSIS — N529 Male erectile dysfunction, unspecified: Secondary | ICD-10-CM

## 2024-01-26 ENCOUNTER — Other Ambulatory Visit: Payer: Self-pay | Admitting: Student

## 2024-01-26 DIAGNOSIS — N400 Enlarged prostate without lower urinary tract symptoms: Secondary | ICD-10-CM

## 2024-02-10 ENCOUNTER — Other Ambulatory Visit: Payer: Self-pay | Admitting: Student

## 2024-02-10 DIAGNOSIS — I10 Essential (primary) hypertension: Secondary | ICD-10-CM

## 2024-02-19 ENCOUNTER — Ambulatory Visit (INDEPENDENT_AMBULATORY_CARE_PROVIDER_SITE_OTHER): Payer: Medicare Other

## 2024-02-19 DIAGNOSIS — I442 Atrioventricular block, complete: Secondary | ICD-10-CM | POA: Diagnosis not present

## 2024-02-19 LAB — CUP PACEART REMOTE DEVICE CHECK
Battery Remaining Longevity: 68 mo
Battery Remaining Percentage: 73 %
Battery Voltage: 2.99 V
Brady Statistic AP VP Percent: 31 %
Brady Statistic AP VS Percent: 1 %
Brady Statistic AS VP Percent: 69 %
Brady Statistic AS VS Percent: 1 %
Brady Statistic RA Percent Paced: 31 %
Date Time Interrogation Session: 20250605020009
Implantable Lead Connection Status: 753985
Implantable Lead Connection Status: 753985
Implantable Lead Connection Status: 753985
Implantable Lead Implant Date: 20131118
Implantable Lead Implant Date: 20131118
Implantable Lead Implant Date: 20230308
Implantable Lead Location: 753858
Implantable Lead Location: 753859
Implantable Lead Location: 753860
Implantable Pulse Generator Implant Date: 20230308
Lead Channel Impedance Value: 330 Ohm
Lead Channel Impedance Value: 430 Ohm
Lead Channel Impedance Value: 830 Ohm
Lead Channel Pacing Threshold Amplitude: 0.625 V
Lead Channel Pacing Threshold Amplitude: 0.625 V
Lead Channel Pacing Threshold Amplitude: 1.125 V
Lead Channel Pacing Threshold Pulse Width: 0.5 ms
Lead Channel Pacing Threshold Pulse Width: 0.5 ms
Lead Channel Pacing Threshold Pulse Width: 0.5 ms
Lead Channel Sensing Intrinsic Amplitude: 11.3 mV
Lead Channel Sensing Intrinsic Amplitude: 3.7 mV
Lead Channel Setting Pacing Amplitude: 1.625
Lead Channel Setting Pacing Amplitude: 2 V
Lead Channel Setting Pacing Amplitude: 2.125
Lead Channel Setting Pacing Pulse Width: 0.5 ms
Lead Channel Setting Pacing Pulse Width: 0.5 ms
Lead Channel Setting Sensing Sensitivity: 4 mV
Pulse Gen Model: 3562
Pulse Gen Serial Number: 8062726

## 2024-02-24 ENCOUNTER — Other Ambulatory Visit: Payer: Self-pay

## 2024-02-24 ENCOUNTER — Encounter: Payer: Self-pay | Admitting: *Deleted

## 2024-02-24 DIAGNOSIS — N400 Enlarged prostate without lower urinary tract symptoms: Secondary | ICD-10-CM

## 2024-02-25 MED ORDER — TAMSULOSIN HCL 0.4 MG PO CAPS
0.4000 mg | ORAL_CAPSULE | Freq: Every day | ORAL | 0 refills | Status: DC
Start: 2024-02-25 — End: 2024-03-29

## 2024-03-01 ENCOUNTER — Ambulatory Visit: Payer: Self-pay | Admitting: Cardiology

## 2024-03-16 ENCOUNTER — Ambulatory Visit: Attending: Cardiology | Admitting: Cardiology

## 2024-03-16 ENCOUNTER — Encounter: Payer: Self-pay | Admitting: Cardiology

## 2024-03-16 VITALS — BP 146/72 | HR 76 | Ht 69.5 in | Wt 181.2 lb

## 2024-03-16 DIAGNOSIS — I442 Atrioventricular block, complete: Secondary | ICD-10-CM

## 2024-03-16 DIAGNOSIS — Z950 Presence of cardiac pacemaker: Secondary | ICD-10-CM

## 2024-03-16 DIAGNOSIS — I10 Essential (primary) hypertension: Secondary | ICD-10-CM

## 2024-03-16 NOTE — Patient Instructions (Signed)
 Medication Instructions:  Your physician recommends that you continue on your current medications as directed. Please refer to the Current Medication list given to you today.  *If you need a refill on your cardiac medications before your next appointment, please call your pharmacy*  Follow-Up: At [facility], you and your health needs are our priority.  As part of our continuing mission to provide you with exceptional heart care, our providers are all part of one team.  This team includes your primary Cardiologist (physician) and Advanced Practice Providers or APPs (Physician Assistants and Nurse Practitioners) who all work together to provide you with the care you need, when you need it.  Your next appointment:   1 year(s)  Provider:   You may see Ardeen Kohler, MD or one of the following Advanced Practice Providers on your designated Care Team:   Mertha Abrahams, [place] [address]" [place], PA-C Suzann Riddle, NP Creighton Doffing, NP

## 2024-03-16 NOTE — Progress Notes (Signed)
 Electrophysiology Office Note:   Date:  03/17/2024  ID:  Robert Flynn, DOB [birth year], MRN [id number]  Primary Cardiologist: None Electrophysiologist: Fonda Kitty, MD      History of Present Illness:   Robert Flynn is a 78 y.o. male with h/o complete heart block s/p pacemaker with drop in LVEF and upgrade to CRT, HTN, PVCs who is being seen today for follow up for his pacemaker.   Discussed the use of AI scribe software for clinical note transcription with the patient, who gave verbal consent to proceed.  History of Present Illness Robert Flynn is an 78 year old male with a pacemaker who presents for routine follow-up. He was previously followed by Dr. Fernande. Two years ago, his pacemaker was upgraded to a CRT-P. He feels well and reports no issues with shortness of breath or lower extremity edema. He has no issues with his current medications. No new or acute complaints.    Review of systems complete and found to be negative unless listed in HPI.   EP Information / Studies Reviewed:    EKG is ordered today. Personal review as below.  EKG Interpretation Date/Time:  Tuesday March 16 2024 13:34:50 EDT Ventricular Rate:  75 PR Interval:  192 QRS Duration:  180 QT Interval:  440 QTC Calculation: 491 R Axis:   -61  Text Interpretation: Atrial-sensed ventricular-paced rhythm Biventricular pacemaker detected When compared with ECG of 05-Jun-2023 18:42, Vent. rate has decreased BY  26 BPM Confirmed by Kitty Fonda [phone]) on 03/17/2024 10:18:09 AM   Echo 11/15/22:  1. Left ventricular ejection fraction, by estimation, is 55 to 60%. The  left ventricle has normal function. The left ventricle has no regional  wall motion abnormalities. There is mild left ventricular hypertrophy of  the basal-septal segment. Left  ventricular diastolic parameters are consistent with Grade I diastolic  dysfunction (impaired relaxation).   2. Right ventricular systolic function is normal. The right  ventricular  size is normal.   3. The mitral valve is normal in structure. Trivial mitral valve  regurgitation. No evidence of mitral stenosis.   4. The aortic valve is normal in structure. Aortic valve regurgitation is  not visualized. No aortic stenosis is present.   5. The inferior vena cava is normal in size with greater than 50%  respiratory variability, suggesting right atrial pressure of 3 mmHg.    Physical Exam:   VS:  BP (!) 146/72   Pulse 76   Ht 5' 9.5 (1.765 m)   Wt 181 lb 3.2 oz (82.2 kg)   SpO2 96%   BMI 26.37 kg/m    Wt Readings from Last 3 Encounters:  03/16/24 181 lb 3.2 oz (82.2 kg)  12/16/23 184 lb (83.5 kg)  10/07/23 185 lb (83.9 kg)     GEN: Well nourished, well developed in no acute distress NECK: No JVD CARDIAC: Normal rate, regular rhythm. RESPIRATORY:  Clear to auscultation without rales, wheezing or rhonchi  ABDOMEN: Soft, non-distended EXTREMITIES:  No edema; No deformity   ASSESSMENT AND PLAN:    #. Complete heart block s/p pacemaker: CRT-P. - In clinic device interrogation was performed today.  Appropriate device function and stable lead parameters.  Presenting rhythm is AS-BiV paced.  Estimated longevity 5.8 years.  No arrhythmias.  Programming appropriate.  Some brief atrial lead noise causing mode switch episodes. -Continue remote monitoring.  #Hypertension -Above goal today.  Recommend checking blood pressures 1-2 times per week at home and recording the values.  Recommend bringing these recordings to the primary care physician.  Follow up with Dr. Kennyth in 12 months  Signed, Fonda Kennyth, MD

## 2024-03-18 NOTE — Progress Notes (Signed)
 Patient is scheduled for 07/18 @310 

## 2024-03-29 ENCOUNTER — Other Ambulatory Visit: Payer: Self-pay

## 2024-03-29 DIAGNOSIS — N529 Male erectile dysfunction, unspecified: Secondary | ICD-10-CM

## 2024-03-29 DIAGNOSIS — N400 Enlarged prostate without lower urinary tract symptoms: Secondary | ICD-10-CM

## 2024-03-29 MED ORDER — SILDENAFIL CITRATE 25 MG PO TABS
25.0000 mg | ORAL_TABLET | ORAL | 0 refills | Status: DC | PRN
Start: 2024-03-29 — End: 2024-06-10

## 2024-03-29 MED ORDER — TAMSULOSIN HCL 0.4 MG PO CAPS
0.4000 mg | ORAL_CAPSULE | Freq: Every day | ORAL | 0 refills | Status: DC
Start: 2024-03-29 — End: 2024-05-05

## 2024-04-02 ENCOUNTER — Encounter: Payer: Self-pay | Admitting: Family Medicine

## 2024-04-02 ENCOUNTER — Ambulatory Visit: Admitting: Family Medicine

## 2024-04-02 VITALS — BP 140/76 | HR 75 | Ht 69.0 in | Wt 183.0 lb

## 2024-04-02 DIAGNOSIS — I10 Essential (primary) hypertension: Secondary | ICD-10-CM

## 2024-04-02 NOTE — Progress Notes (Signed)
    SUBJECTIVE:   CHIEF COMPLAINT / HPI:   Here for blood pressure follow up Readings elevated at visit 12/16/23 and cardiology visit 03/16/24 On max doses of amlodipine , losartan , and hctz Does not have BP cuff at home Asymptomatic   PERTINENT  PMH / PSH: HTN, complete AV block, thrombocytopenia, pacemaker, HLD  OBJECTIVE:   BP (!) 140/76   Pulse 75   Ht 5' 9 (1.753 m)   Wt 183 lb (83 kg)   SpO2 100%   BMI 27.02 kg/m   General: well appearing, NAD Cardiovascular: RRR, no m/r/g Respiratory: normal work of breathing on RA, CTAB Abdomen: Normal bowel sounds, soft, non-tender Extremities: No swelling BLE  ASSESSMENT/PLAN:   Assessment & Plan HYPERTENSION, BENIGN SYSTEMIC BP mildly elevated today and at last 2 office visits, asymptomatic, goal <135/85 Currently on max doses of amlodipine , losartan , and hydrochlorothiazide  Scheduled for ambulatory blood pressure monitoring next week prior to adding additional agents ED precautions discussed     Elyce Prescott, DO [facility]

## 2024-04-02 NOTE — Patient Instructions (Signed)
 Good to see you today - Thank you for coming in  Things we discussed today:  I have scheduled you for ambulatory blood pressure monitoring with Dr. Koval. PLEASE MAKE THIS APPOINTMENT. It will help us  decide what to do with your blood pressure medicine.   Your goal blood pressure is less than 135/85  Check your blood pressure several times a week.  If regularly higher than this please let me know - either with MyChart or leaving a phone message.  Please always bring your medication bottles

## 2024-04-02 NOTE — Assessment & Plan Note (Signed)
 BP mildly elevated today and at last 2 office visits, asymptomatic, goal <135/85 Currently on max doses of amlodipine , losartan , and hydrochlorothiazide  Scheduled for ambulatory blood pressure monitoring next week prior to adding additional agents ED precautions discussed

## 2024-04-06 ENCOUNTER — Ambulatory Visit (INDEPENDENT_AMBULATORY_CARE_PROVIDER_SITE_OTHER): Admitting: Pharmacist

## 2024-04-06 ENCOUNTER — Encounter: Payer: Self-pay | Admitting: Pharmacist

## 2024-04-06 VITALS — BP 145/81 | Wt 183.0 lb

## 2024-04-06 DIAGNOSIS — I10 Essential (primary) hypertension: Secondary | ICD-10-CM

## 2024-04-06 NOTE — Patient Instructions (Signed)
 Blood Pressure Activity Diary Time Lying down/ Sleeping Walking/ Exercise Stressed/ Angry Headache/ Pain Dizzy  9 AM       10 AM       11 AM       12 PM       1 PM       2 PM       Time Lying down/ Sleeping Walking/ Exercise Stressed/ Angry Headache/ Pain Dizzy  3 PM       4 PM        5 PM       6 PM       7 PM       8 PM       Time Lying down/ Sleeping Walking/ Exercise Stressed/ Angry Headache/ Pain Dizzy  9 PM       10 PM       11 PM       12 AM       1 AM       2 AM       3 AM       Time Lying down/ Sleeping Walking/ Exercise Stressed/ Angry Headache/ Pain Dizzy  4 AM       5 AM       6 AM       7 AM       8 AM       9 AM       10 AM        Time you woke up: _________                  Time you went to sleep:__________  Come back tomorrow at 11:30 to have the monitor removed  Call the [facility] if you have any questions before then ([phone])  Wearing the Blood Pressure Monitor The cuff will inflate every 20 minutes during the day and every 30 minutes while you sleep. Fill out the blood pressure-activity diary during the day, especially during activities that may affect your reading -- such as exercise, stress, walking, taking your blood pressure medications  Important things to know: Avoid taking the monitor off for the next 24 hours, unless it causes you discomfort or pain. Do NOT get the monitor wet and do NOT try to clean the monitor with any cleaning products. Do NOT put the monitor on anyone else's arm. When the cuff inflates, avoid excess movement. Let the cuffed arm hang loosely, slightly away from the body. Avoid flexing the muscles or moving the hand/fingers. Remember to fill out the blood pressure activity diary. If you experience severe pain or unusual pain (not associated with getting your blood pressure checked), remove the monitor.  Troubleshooting:  Code  Troubleshooting   1  Check cuff position, tighten cuff   2, 3  Remain  still during reading   4, 87  Check air hose connections and make sure cuff is tight   85, 89  Check hose connections and make tubing is not crimped   86  Push START/STOP to restart reading   88, 91  Retry by pushing START/STOP   90  Replace batteries. If problem persists, remove monitor and bring back to   clinic at follow up   97, 98, 99  Service required - Remove monitor and bring back to clinic at follow up

## 2024-04-06 NOTE — Assessment & Plan Note (Signed)
 History of hypertension since 2008 currently taking three drug regimen. Goal presssure of <135/85 mm Hg per Dr. Stoney - visit 04/02/2024     -Placed blood pressure cuff, provided education, patient instructed to wear cuff for 24 hours and return tomorrow to review results.

## 2024-04-06 NOTE — Progress Notes (Signed)
   S:     Chief Complaint  Patient presents with   Medication Management    Amb Blood Pressure Monitor - Day #1   78 y.o. male who presents for hypertension evaluation, education, and management. Patient arrives in good spirits and presents without any assistance. Patient is accompanied by daughter, Asberry.   Patient was referred and last seen by Primary Care Provider, Dr. Stoney, on 04/02/2024.  At last visit, blood pressure was higher than target and ambulatory assessment was scheduled for evaluation prior to escalation of therapy.   PMH is significant for Gout, AV Block, Hyperlipidemia.   Diagnosed with Hypertension in 2008.    Medication compliance is reported to be good.  Denies use of any gout tx over the last 6 months.   Discussed procedure for wearing the monitor and gave patient written instructions. Monitor was placed on non-dominant arm with instructions to return in the morning.   Current BP Medications include:  amlodipine  10 mg daily, losartan /hydrochlorothiazide  100/25 mg daily  Dietary habits include:  O:  Review of Systems  All other systems reviewed and are negative.   Physical Exam Vitals reviewed.  Constitutional:      Appearance: Normal appearance.  Pulmonary:     Effort: Pulmonary effort is normal.  Musculoskeletal:     Right lower leg: No edema.     Left lower leg: No edema.  Neurological:     Mental Status: He is alert.  Psychiatric:        Mood and Affect: Mood normal.        Behavior: Behavior normal.        Thought Content: Thought content normal.        Judgment: Judgment normal.     Last 3 Office BP readings: BP Readings from Last 3 Encounters:  04/06/24 (!) 145/81  04/02/24 (!) 140/76  03/16/24 (!) 146/72    Clinical Atherosclerotic Cardiovascular Disease (ASCVD): No  The ASCVD Risk score (Arnett DK, et al., 2019) failed to calculate for the following reasons:   The 2019 ASCVD risk score is only valid for ages 46 to 54  Basic  Metabolic Panel    Component Value Date/Time   NA 141 10/07/2023 0948   K 3.5 10/07/2023 0948   CL 101 10/07/2023 0948   CO2 25 10/07/2023 0948   GLUCOSE 118 (H) 10/07/2023 0948   GLUCOSE 114 (H) 06/05/2023 1847   BUN 20 10/07/2023 0948   CREATININE 1.62 (H) 10/07/2023 0948   CREATININE 1.34 (H) 09/04/2016 1054   CALCIUM  9.1 10/07/2023 0948   GFRNONAA 46 (L) 06/05/2023 1847   GFRAA 60 03/27/2020 1044    Renal function: CrCl cannot be calculated (Patient's most recent lab result is older than the maximum 21 days allowed.).   ABPM Study Data: Arm Placement left arm  For Office Goal BP of <135/85 mmHg per PCP    A/P: History of hypertension since 2008 currently taking three drug regimen. Goal presssure of <135/85 mm Hg per Dr. Stoney - visit 04/02/2024     -Placed blood pressure cuff, provided education, patient instructed to wear cuff for 24 hours and return tomorrow to review results.   Written patient instructions provided including activity/symptom/event log. Patient verbalized understanding of plan. Total time in face to face counseling 17 minutes.    Follow-up: Tomorrow AM - late morning appointment 11:30 AM anticipated.  Patient seen with Dr. Camie Dixons, DO

## 2024-04-06 NOTE — Progress Notes (Signed)
 Remote pacemaker transmission.

## 2024-04-06 NOTE — Progress Notes (Signed)
 Reviewed and agree with Dr Macky Lower plan.

## 2024-04-07 ENCOUNTER — Ambulatory Visit: Admitting: Pharmacist

## 2024-04-07 ENCOUNTER — Encounter: Payer: Self-pay | Admitting: Pharmacist

## 2024-04-07 VITALS — BP 125/72 | HR 82

## 2024-04-07 DIAGNOSIS — I10 Essential (primary) hypertension: Secondary | ICD-10-CM | POA: Diagnosis not present

## 2024-04-07 NOTE — Patient Instructions (Signed)
 It was nice to see you today!  Thank you for completing the blood pressure monitoring evaluation.  Your goal blood pressure is < 130/80 mmHg  Your awake readings averaged 125/72 mm Hg  Medication Changes: Continue all other medication the same.

## 2024-04-07 NOTE — Progress Notes (Signed)
   S:     Chief Complaint  Patient presents with   Medication Management    Amb Blood Pressure monitor Day #2   78 y.o. male who presents for hypertension evaluation, education, and management.  Patient arrives in good spirits and presents without any assistance.   Patient returns to clinic with 24 hour blood pressure monitor and reports they were able to wear the ambulatory blood pressure cuff for the entire 24 evaluation period.    O:  Review of Systems  All other systems reviewed and are negative.   Physical Exam Vitals reviewed.  Pulmonary:     Effort: Pulmonary effort is normal.  Neurological:     Mental Status: He is alert.  Psychiatric:        Mood and Affect: Mood normal.        Behavior: Behavior normal.        Thought Content: Thought content normal.        Judgment: Judgment normal.     Last 3 Office BP readings: BP Readings from Last 3 Encounters:  04/07/24 125/72  04/06/24 (!) 145/81  04/02/24 (!) 140/76    Clinical Atherosclerotic Cardiovascular Disease (ASCVD): No  The ASCVD Risk score (Arnett DK, et al., 2019) failed to calculate for the following reasons:   The 2019 ASCVD risk score is only valid for ages 83 to 12  Basic Metabolic Panel    Component Value Date/Time   NA 141 10/07/2023 0948   K 3.5 10/07/2023 0948   CL 101 10/07/2023 0948   CO2 25 10/07/2023 0948   GLUCOSE 118 (H) 10/07/2023 0948   GLUCOSE 114 (H) 06/05/2023 1847   BUN 20 10/07/2023 0948   CREATININE 1.62 (H) 10/07/2023 0948   CREATININE 1.34 (H) 09/04/2016 1054   CALCIUM  9.1 10/07/2023 0948   GFRNONAA 46 (L) 06/05/2023 1847   GFRAA 60 03/27/2020 1044    Renal function: CrCl cannot be calculated (Patient's most recent lab result is older than the maximum 21 days allowed.).   ABPM Study Data: Arm Placement left arm  Overall Mean 24hr BP:   126/69 mmHg  HR: 77  Daytime Mean BP:  125/72 mmHg  HR: 82  Nighttime Mean BP:  126/63 mmHg  HR: 63  Dipping Pattern: No.  Sys:    -1%%   Dia: 11%  [normal dipping ~10-20%]   For Office Goal BP of <135/85 mmHg:  ABPM thresholds: Overall BP <125/75 mmHg, daytime BP <130/80 mmHg, sleeptime BP <110/65 mmHg    A/P: History of hypertension since 2008 and currently taking three drug regimen. Goal presssure of <135/85 mm Hg per Dr. Stoney - visit 04/02/2024.  Found to have normal / well controlledl blood pressure with 24-hour ambulatory blood pressure evaluation which demonstrates an average AWAKE blood pressure of 125/72 mmHg.  Likely has an element of white coat hypertension and it may be reasonable to perform home monitoring with patient IF he has readings > target in office in the future.  Changes to medications - None - Continue amlodipine  10 mg daily, losartan /hydrochlorothiazide  100/25 mg daily  Results reviewed and written information provided.    Written patient instructions provided. Patient verbalized understanding of treatment plan.  Total time in face to face counseling 18 minutes.    Follow-up:  Pharmacist PRN PCP clinic visit in PRN

## 2024-04-07 NOTE — Assessment & Plan Note (Signed)
 History of hypertension since 2008 and currently taking three drug regimen. Goal presssure of <135/85 mm Hg per Dr. Stoney - visit 04/02/2024.  Found to have normal / well controlledl blood pressure with 24-hour ambulatory blood pressure evaluation which demonstrates an average AWAKE blood pressure of 125/72 mmHg.  Likely has an element of white coat hypertension and it may be reasonable to perform home monitoring with patient IF he has readings > target in office in the future.  Changes to medications - None - Continue amlodipine  10 mg daily, losartan /hydrochlorothiazide  100/25 mg daily

## 2024-04-08 NOTE — Progress Notes (Signed)
 Reviewed and agree with Dr Macky Lower plan.

## 2024-05-05 ENCOUNTER — Other Ambulatory Visit: Payer: Self-pay

## 2024-05-05 DIAGNOSIS — N400 Enlarged prostate without lower urinary tract symptoms: Secondary | ICD-10-CM

## 2024-05-05 MED ORDER — TAMSULOSIN HCL 0.4 MG PO CAPS
0.4000 mg | ORAL_CAPSULE | Freq: Every day | ORAL | 3 refills | Status: DC
Start: 2024-05-05 — End: 2024-06-08

## 2024-05-10 NOTE — Progress Notes (Unsigned)
    SUBJECTIVE:   CHIEF COMPLAINT / HPI:   BP Follow up Underwent ambulatory blood pressure monitoring 7/23 Found to have average awake blood pressure of 125/72, no nocturnal dipping No changes to BP regimen  R shoulder pain last couple weeks, typically when he lays in bed at night No injury  Taking tylenol  with no improvement Wonders if it may be arthritic pain  PERTINENT  PMH / PSH: HTN, hx complete AV block, pacemaker, HLD, OA  OBJECTIVE:   BP 132/70   Pulse 75   Ht 5' 9 (1.753 m)   Wt 185 lb 2 oz (84 kg)   SpO2 100%   BMI 27.34 kg/m   General: well appearing, NAD Cardiovascular: RRR, no m/r/g Respiratory: normal work of breathing on RA, CTAB Extremities: No TTP over R shoulder. No overlying skin changes. Full ROM of BUE without pain.   ASSESSMENT/PLAN:   Assessment & Plan HYPERTENSION, BENIGN SYSTEMIC Stable, within normal limits today Continue amlodipine  10 mg, Hyzaar 100-25 mg Follow-up in 6 months Acute pain of right shoulder Sounds like muscle strain versus arthritis.  He has full range of motion of the shoulder without pain today.  No chest pain. Will try topical Voltaren  gel as needed for pain Advised to return if it does not improve, could consider imaging, steroid injection     Elyce Prescott, DO [facility]

## 2024-05-11 ENCOUNTER — Encounter: Payer: Self-pay | Admitting: Family Medicine

## 2024-05-11 ENCOUNTER — Ambulatory Visit (INDEPENDENT_AMBULATORY_CARE_PROVIDER_SITE_OTHER): Payer: Self-pay | Admitting: Family Medicine

## 2024-05-11 VITALS — BP 132/70 | HR 75 | Ht 69.0 in | Wt 185.1 lb

## 2024-05-11 DIAGNOSIS — M25511 Pain in right shoulder: Secondary | ICD-10-CM | POA: Diagnosis not present

## 2024-05-11 DIAGNOSIS — I10 Essential (primary) hypertension: Secondary | ICD-10-CM | POA: Diagnosis not present

## 2024-05-11 MED ORDER — DICLOFENAC SODIUM 1 % EX GEL: 2.0000 g | Freq: Four times a day (QID) | CUTANEOUS | 0 refills | Status: AC

## 2024-05-11 NOTE — Assessment & Plan Note (Signed)
 Sounds like muscle strain versus arthritis.  He has full range of motion of the shoulder without pain today.  No chest pain. Will try topical Voltaren  gel as needed for pain Advised to return if it does not improve, could consider imaging, steroid injection

## 2024-05-11 NOTE — Patient Instructions (Signed)
 Good to see you today - Thank you for coming in  Things we discussed today: Apply the voltaren  gel to your right shoulder as needed for pain at night If it does not work, please come back  Continue your blood pressure medicine   Please always bring your medication bottles  Come back to see me in 6 months

## 2024-05-11 NOTE — Assessment & Plan Note (Signed)
 Stable, within normal limits today Continue amlodipine  10 mg, Hyzaar 100-25 mg Follow-up in 6 months

## 2024-05-18 ENCOUNTER — Other Ambulatory Visit: Payer: Self-pay

## 2024-05-18 DIAGNOSIS — I10 Essential (primary) hypertension: Secondary | ICD-10-CM

## 2024-05-18 MED ORDER — AMLODIPINE BESYLATE 10 MG PO TABS: 10.0000 mg | ORAL_TABLET | Freq: Every day | ORAL | 3 refills | Status: DC

## 2024-05-20 ENCOUNTER — Ambulatory Visit: Payer: Medicare Other

## 2024-05-20 DIAGNOSIS — I442 Atrioventricular block, complete: Secondary | ICD-10-CM | POA: Diagnosis not present

## 2024-05-21 LAB — CUP PACEART REMOTE DEVICE CHECK
Battery Remaining Longevity: 67 mo
Battery Remaining Percentage: 70 %
Battery Voltage: 2.99 V
Brady Statistic AP VP Percent: 24 %
Brady Statistic AP VS Percent: 1 %
Brady Statistic AS VP Percent: 76 %
Brady Statistic AS VS Percent: 1 %
Brady Statistic RA Percent Paced: 24 %
Date Time Interrogation Session: 20250904020009
Implantable Lead Connection Status: 753985
Implantable Lead Connection Status: 753985
Implantable Lead Connection Status: 753985
Implantable Lead Implant Date: 20131118
Implantable Lead Implant Date: 20131118
Implantable Lead Implant Date: 20230308
Implantable Lead Location: 753858
Implantable Lead Location: 753859
Implantable Lead Location: 753860
Implantable Pulse Generator Implant Date: 20230308
Lead Channel Impedance Value: 330 Ohm
Lead Channel Impedance Value: 450 Ohm
Lead Channel Impedance Value: 850 Ohm
Lead Channel Pacing Threshold Amplitude: 0.625 V
Lead Channel Pacing Threshold Amplitude: 0.625 V
Lead Channel Pacing Threshold Amplitude: 1.125 V
Lead Channel Pacing Threshold Pulse Width: 0.5 ms
Lead Channel Pacing Threshold Pulse Width: 0.5 ms
Lead Channel Pacing Threshold Pulse Width: 0.5 ms
Lead Channel Sensing Intrinsic Amplitude: 4.3 mV
Lead Channel Sensing Intrinsic Amplitude: 7.6 mV
Lead Channel Setting Pacing Amplitude: 1.625
Lead Channel Setting Pacing Amplitude: 2 V
Lead Channel Setting Pacing Amplitude: 2.125
Lead Channel Setting Pacing Pulse Width: 0.5 ms
Lead Channel Setting Pacing Pulse Width: 0.5 ms
Lead Channel Setting Sensing Sensitivity: 4 mV
Pulse Gen Model: 3562
Pulse Gen Serial Number: 8062726

## 2024-05-23 ENCOUNTER — Ambulatory Visit: Payer: Self-pay | Admitting: Cardiology

## 2024-05-29 NOTE — Progress Notes (Signed)
 Remote PPM Transmission

## 2024-06-08 ENCOUNTER — Encounter: Payer: Self-pay | Admitting: Family Medicine

## 2024-06-08 ENCOUNTER — Ambulatory Visit

## 2024-06-08 ENCOUNTER — Ambulatory Visit (INDEPENDENT_AMBULATORY_CARE_PROVIDER_SITE_OTHER): Admitting: Family Medicine

## 2024-06-08 ENCOUNTER — Other Ambulatory Visit (HOSPITAL_COMMUNITY)
Admission: RE | Admit: 2024-06-08 | Discharge: 2024-06-08 | Disposition: A | Discharge status: Home / Self Care | Source: Ambulatory Visit | Attending: Family Medicine | Admitting: Family Medicine

## 2024-06-08 VITALS — BP 141/84 | HR 87 | Ht 69.0 in | Wt 181.6 lb

## 2024-06-08 DIAGNOSIS — Z113 Encounter for screening for infections with a predominantly sexual mode of transmission: Secondary | ICD-10-CM | POA: Insufficient documentation

## 2024-06-08 DIAGNOSIS — E782 Mixed hyperlipidemia: Secondary | ICD-10-CM

## 2024-06-08 DIAGNOSIS — Z114 Encounter for screening for human immunodeficiency virus [HIV]: Secondary | ICD-10-CM | POA: Diagnosis not present

## 2024-06-08 DIAGNOSIS — N400 Enlarged prostate without lower urinary tract symptoms: Secondary | ICD-10-CM

## 2024-06-08 MED ORDER — ATORVASTATIN CALCIUM 80 MG PO TABS: 80.0000 mg | ORAL_TABLET | Freq: Every day | ORAL | 3 refills | Status: AC

## 2024-06-08 MED ORDER — TAMSULOSIN HCL 0.4 MG PO CAPS: 0.4000 mg | ORAL_CAPSULE | Freq: Every day | ORAL | 3 refills | Status: DC

## 2024-06-08 NOTE — Progress Notes (Signed)
    SUBJECTIVE:   CHIEF COMPLAINT / HPI:   STD testing His wife wants him to be tested because she thought he gave her something Asymptomatic, no fevers, discharge rashes or pain Only sexually active with his wife   Needs refills on flomax  and lipitor  Reports he took his BP medications today Denies HA, SOB/CP, vision changes  PERTINENT  PMH / PSH: HTN, HLD, BPH  OBJECTIVE:   BP (!) 141/84   Pulse 87   Ht 5' 9 (1.753 m)   Wt 181 lb 9.6 oz (82.4 kg)   SpO2 100%   BMI 26.82 kg/m    General: NAD, pleasant, able to participate in exam Cardiac: RRR, no murmurs auscultated Respiratory: CTAB, normal WOB Abdomen: soft, non-tender, non-distended, normoactive bowel sounds Extremities: warm and well perfused, no edema or cyanosis Skin: warm and dry, no rashes noted Neuro: alert, no obvious focal deficits, speech normal Psych: Normal affect and mood  ASSESSMENT/PLAN:   Assessment & Plan Screening examination for STD (sexually transmitted disease) Urine G/C/Trich testing Blood tests for HIV/ syphilis Daughter requesting printed letter for pickup with results when available even if negative - not able to access mychart  Sent refills of Lipitor and Flomax   BP slightly above goal today, likely due to stress per patient.  Routine PCP follow-up  Payton Coward, MD [facility]

## 2024-06-08 NOTE — Patient Instructions (Addendum)
 I will write a letter for you to pick up with his results  Your blood pressure is high today.  Please continue taking your current medications.  I recommend finding a blood pressure cuff for you to monitor your blood pressure at home.  Please follow-up with your primary care doctor within the next month.

## 2024-06-09 ENCOUNTER — Other Ambulatory Visit: Payer: Self-pay | Admitting: Family Medicine

## 2024-06-09 DIAGNOSIS — N529 Male erectile dysfunction, unspecified: Secondary | ICD-10-CM

## 2024-06-09 LAB — URINE CYTOLOGY ANCILLARY ONLY
Chlamydia: NEGATIVE
Comment: NEGATIVE
Comment: NEGATIVE
Comment: NORMAL
Neisseria Gonorrhea: NEGATIVE
Trichomonas: NEGATIVE

## 2024-06-09 LAB — HIV ANTIBODY (ROUTINE TESTING W REFLEX): HIV Screen 4th Generation wRfx: NONREACTIVE

## 2024-06-09 LAB — RPR: RPR Ser Ql: NONREACTIVE

## 2024-06-10 ENCOUNTER — Telehealth: Payer: Self-pay

## 2024-06-10 ENCOUNTER — Ambulatory Visit: Payer: Self-pay | Admitting: Family Medicine

## 2024-06-10 NOTE — Telephone Encounter (Signed)
 Called daughter and informed her that the patient letter is ready for pick up

## 2024-06-18 ENCOUNTER — Telehealth: Payer: Self-pay

## 2024-06-18 NOTE — Telephone Encounter (Signed)
 This patient is appearing on a report for being at risk of failing the adherence measure for hypertension (ACEi/ARB) medications this calendar year.    Patient contacted for follow-up of adherence.  He reports having all of his medications.   Review of Dr. Annemarie also shows overdue for prescription refill.   Patient not at home during call.  Asked to review medications when he gets home.   Total time with patient call and documentation of interaction: 8 minutes.

## 2024-06-18 NOTE — Telephone Encounter (Signed)
 Opened in error

## 2024-07-17 ENCOUNTER — Other Ambulatory Visit: Payer: Self-pay | Admitting: Family Medicine

## 2024-07-17 DIAGNOSIS — N529 Male erectile dysfunction, unspecified: Secondary | ICD-10-CM

## 2024-07-29 ENCOUNTER — Ambulatory Visit

## 2024-07-29 VITALS — Ht 69.0 in | Wt 180.0 lb

## 2024-07-29 DIAGNOSIS — Z0000 Encounter for general adult medical examination without abnormal findings: Secondary | ICD-10-CM | POA: Diagnosis not present

## 2024-07-29 NOTE — Patient Instructions (Signed)
 Robert Flynn,  Thank you for taking the time for your Medicare Wellness Visit. I appreciate your continued commitment to your health goals. Please review the care plan we discussed, and feel free to reach out if I can assist you further.  Please note that Annual Wellness Visits do not include a physical exam. Some assessments may be limited, especially if the visit was conducted virtually. If needed, we may recommend an in-person follow-up with your provider.  Ongoing Care Seeing your primary care provider every 3 to 6 months helps us  monitor your health and provide consistent, personalized care.   Referrals If a referral was made during today's visit and you haven't received any updates within two weeks, please contact the referred provider directly to check on the status.  Recommended Screenings:  Health Maintenance  Topic Date Due   Medicare Annual Wellness Visit  Never done   DTaP/Tdap/Td vaccine (3 - Tdap) 02/20/2020   Flu Shot  04/16/2024   COVID-19 Vaccine (4 - 2025-26 season) 05/17/2024   Pneumococcal Vaccine for age over 93  Completed   Zoster (Shingles) Vaccine  Completed   Meningitis B Vaccine  Aged Out   Colon Cancer Screening  Discontinued   Hepatitis C Screening  Discontinued       07/29/2024    1:41 PM  Advanced Directives  Does Patient Have a Medical Advance Directive? No  Would patient like information on creating a medical advance directive? No - Patient declined    Vision: Annual vision screenings are recommended for early detection of glaucoma, cataracts, and diabetic retinopathy. These exams can also reveal signs of chronic conditions such as diabetes and high blood pressure.  Dental: Annual dental screenings help detect early signs of oral cancer, gum disease, and other conditions linked to overall health, including heart disease and diabetes.  Please see the attached documents for additional preventive care recommendations.

## 2024-07-29 NOTE — Progress Notes (Signed)
 I connected with  Robert Flynn on 07/29/24 by a audio enabled telemedicine application and verified that I am speaking with the correct person using two identifiers.  Patient Location: Home  Provider Location: Home Office  Persons Participating in Visit: Patient.  I discussed the limitations of evaluation and management by telemedicine. The patient expressed understanding and agreed to proceed.   Vital Signs: Because this visit was a virtual/telehealth visit, some criteria may be missing or patient reported. Any vitals not documented were not able to be obtained and vitals that have been documented are patient reported.     Chief Complaint  Patient presents with   Medicare Wellness    SUBSEQUENT     Subjective:   Robert Flynn is a 78 y.o. male who presents for a Medicare Annual Wellness Visit.  Allergies (verified) Patient has no known allergies.   History: Past Medical History:  Diagnosis Date   ADJUSTMENT DISORDER WITHOUT DEPRESSED MOOD    ANXIETY    DVT (deep venous thrombosis) (HCC)    of the right arm post pacemaker; on Xarelto  for 3 months   EXTERNAL HEMORRHOIDS    Gout    Hypertension    OBESITY, NOS    OSTEOARTHRITIS, MULTI SITES    Pacemaker- [employer]    DOI 11/13   Right bundle branch block and left anterior fascicular block Dec 2013   Progressive heart block with exercise>>3:1; s/p PTVP   Sinus bradycardia    Past Surgical History:  Procedure Laterality Date   BIV UPGRADE N/A 11/21/2021   Procedure: BIV UPGRADE;  Surgeon: Fernande Elspeth BROCKS, MD;  Location: [facility] INVASIVE CV LAB;  Service: Cardiovascular;  Laterality: N/A;   PACEMAKER INSERTION  December 2013   PERMANENT PACEMAKER INSERTION N/A 08/03/2012   Procedure: PERMANENT PACEMAKER INSERTION;  Surgeon: Elspeth BROCKS Fernande, MD;  Location: [facility] CATH LAB;  Service: Cardiovascular;  Laterality: N/A;   PPM GENERATOR CHANGEOUT N/A 11/21/2021   Procedure: PPM GENERATOR CHANGEOUT;  Surgeon: Fernande Elspeth BROCKS, MD;   Location: [facility] INVASIVE CV LAB;  Service: Cardiovascular;  Laterality: N/A;   Family History  Problem Relation Age of Onset   Heart disease Brother    Social History   Occupational History   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   Tobacco Use   Smoking status: Never   Smokeless tobacco: Never  Substance and Sexual Activity   Alcohol use: Yes    Alcohol/week: 2.0 standard drinks of alcohol    Types: 2 drink(s) per week   Drug use: No   Sexual activity: Not on file   Tobacco Counseling Counseling given: Not Answered  SDOH Screenings   Food Insecurity: No Food Insecurity (07/29/2024)  Housing: Low Risk  (07/29/2024)  Transportation Needs: No Transportation Needs (07/29/2024)  Utilities: Not At Risk (07/29/2024)  Depression (PHQ2-9): Low Risk  (07/29/2024)  Physical Activity: Sufficiently Active (07/29/2024)  Social Connections: Socially Integrated (07/29/2024)  Stress: No Stress Concern Present (07/29/2024)  Tobacco Use: Low Risk  (07/29/2024)  Health Literacy: Adequate Health Literacy (07/29/2024)   See flowsheets for full screening details  Depression Screen PHQ 2 & 9 Depression Scale- Over the past 2 weeks, how often have you been bothered by any of the following problems? Little interest or pleasure in doing things: 0 Feeling down, depressed, or hopeless (PHQ Adolescent also includes...irritable): 0 PHQ-2 Total Score: 0 Trouble falling or staying asleep, or sleeping too much: 0 Feeling tired or having little energy: 0 Poor appetite or overeating (PHQ Adolescent  also includes...weight loss): 0 Feeling bad about yourself - or that you are a failure or have let yourself or your family down: 0 Trouble concentrating on things, such as reading the newspaper or watching television (PHQ Adolescent also includes...like school work): 0 Moving or speaking so slowly that other people could have noticed. Or the opposite - being so fidgety or restless that you have been moving around a lot  more than usual: 0 Thoughts that you would be better off dead, or of hurting yourself in some way: 0 PHQ-9 Total Score: 0 If you checked off any problems, how difficult have these problems made it for you to do your work, take care of things at home, or get along with other people?: Not difficult at all     Goals Addressed             This Visit's Progress    07/29/2024: My healthcare goal is to maintain my health by staying physically active and independent.         Visit info / Clinical Intake: Medicare Wellness Visit Type:: Initial Annual Wellness Visit Persons participating in visit:: patient Medicare Wellness Visit Mode:: Telephone If telephone:: video declined Because this visit was a virtual/telehealth visit:: pt reported vitals If Telephone or Video please confirm:: I connected with the patient using audio enabled telemedicine application and verified that I am speaking with the correct person using two identifiers; I discussed the limitations of evaluation and management by telemedicine; The patient expressed understanding and agreed to proceed Patient Location:: HOME Provider Location:: HOME OFFICE Information given by:: patient Interpreter Needed?: No Pre-visit prep was completed: yes AWV questionnaire completed by patient prior to visit?: no Living arrangements:: lives with spouse/significant other Patient's Overall Health Status Rating: (!) fair Typical amount of pain: none Does pain affect daily life?: no Are you currently prescribed opioids?: no  Dietary Habits and Nutritional Risks How many meals a day?: 3 Eats fruit and vegetables daily?: yes Most meals are obtained by: preparing own meals In the last 2 weeks, have you had any of the following?: none Diabetic:: no  Functional Status Activities of Daily Living (to include ambulation/medication): Independent Ambulation: Independent Medication Administration: Independent Home Management:  Independent Manage your own finances?: yes Primary transportation is: driving Concerns about vision?: no *vision screening is required for WTM* Concerns about hearing?: no  Fall Screening Falls in the past year?: 0 Number of falls in past year: 0 Was there an injury with Fall?: 0 Fall Risk Category Calculator: 0 Patient Fall Risk Level: Low Fall Risk  Fall Risk Patient at Risk for Falls Due to: No Fall Risks Fall risk Follow up: Falls evaluation completed; Education provided  Home and Transportation Safety: All rugs have non-skid backing?: (!) no All stairs or steps have railings?: yes Grab bars in the bathtub or shower?: (!) no Have non-skid surface in bathtub or shower?: (!) no Good home lighting?: yes Regular seat belt use?: yes Hospital stays in the last year:: no  Cognitive Assessment Difficulty concentrating, remembering, or making decisions? : no Will 6CIT or Mini Cog be Completed: no 6CIT or Mini Cog Declined: patient alert, oriented, able to answer questions appropriately and recall recent events  Advance Directives (For Healthcare) Does Patient Have a Medical Advance Directive?: No Would patient like information on creating a medical advance directive?: No - Patient declined  Reviewed/Updated  Reviewed/Updated: Reviewed All (Medical, Surgical, Family, Medications, Allergies, Care Teams, Patient Goals)        Objective:  Today's Vitals   07/29/24 1340  Weight: 180 lb (81.6 kg)  Height: 5' 9 (1.753 m)  PainSc: 0-No pain   Body mass index is 26.58 kg/m.  Current Medications (verified) Outpatient Encounter Medications as of 07/29/2024  Medication Sig   amLODipine  (NORVASC ) 10 MG tablet Take 1 tablet (10 mg total) by mouth daily.   aspirin  81 MG tablet Take 81 mg by mouth daily.     atorvastatin  (LIPITOR) 80 MG tablet Take 1 tablet (80 mg total) by mouth daily.   Colchicine  0.6 MG CAPS Take 1-2 capsules (0.6-1.2 mg total) by mouth as needed.    diclofenac  Sodium (VOLTAREN ) 1 % GEL Apply 2 g topically 4 (four) times daily. To right shoulder   losartan -hydrochlorothiazide  (HYZAAR) 100-25 MG tablet Take 1 tablet by mouth daily.   sildenafil  (VIAGRA ) 25 MG tablet TAKE 1 TABLET BY MOUTH AS NEEDED FOR ERECTILE DYSFUNCTION   tamsulosin  (FLOMAX ) 0.4 MG CAPS capsule Take 1 capsule (0.4 mg total) by mouth daily.   No facility-administered encounter medications on file as of 07/29/2024.   Hearing/Vision screen Hearing Screening - Comments:: Denies hearing difficulties.  Vision Screening - Comments:: Wears rx glasses - up to date with routine eye exams with My Hong Le, OD.  Immunizations and Health Maintenance Health Maintenance  Topic Date Due   DTaP/Tdap/Td (3 - Tdap) 02/20/2020   Influenza Vaccine  04/16/2024   COVID-19 Vaccine (4 - 2025-26 season) 05/17/2024   Medicare Annual Wellness (AWV)  07/29/2025   Pneumococcal Vaccine: 50+ Years  Completed   Zoster Vaccines- Shingrix  Completed   Meningococcal B Vaccine  Aged Out   Colonoscopy  Discontinued   Hepatitis C Screening  Discontinued        Assessment/Plan:  This is a routine wellness examination for [place].  Patient Care Team: Stoney Blizzard, DO as PCP - General (Family Medicine) Kennyth Chew, MD as PCP - Electrophysiology (Cardiology) [place], My Row[place], [place] as Referring Physician (Optometry)  I have personally reviewed and noted the following in the patient's chart:   Medical and social history Use of alcohol, tobacco or illicit drugs  Current medications and supplements including opioid prescriptions. Functional ability and status Nutritional status Physical activity Advanced directives List of other physicians Hospitalizations, surgeries, and ER visits in previous 12 months Vitals Screenings to include cognitive, depression, and falls Referrals and appointments  No orders of the defined types were placed in this encounter.  In addition, I have reviewed and  discussed with patient certain preventive protocols, quality metrics, and best practice recommendations. A written personalized care plan for preventive services as well as general preventive health recommendations were provided to patient.   Roz LOISE Fuller, LPN   [id number]   Return in about 1 year (around 07/29/2025) for Medicare wellness.  After Visit Summary: (MyChart) Due to this being a telephonic visit, the after visit summary with patients personalized plan was offered to patient via MyChart   Nurse Notes: Patient aware of current care gaps.  Immunization record was verified by [facility] and updated in patient's chart. Patient is due for Dtap, Flu and Covid vaccines.

## 2024-08-02 ENCOUNTER — Other Ambulatory Visit: Payer: Self-pay

## 2024-08-02 MED ORDER — LOSARTAN POTASSIUM-HCTZ 100-25 MG PO TABS: 1.0000 | ORAL_TABLET | Freq: Every day | ORAL | 11 refills | Status: AC

## 2024-08-19 ENCOUNTER — Ambulatory Visit: Payer: Medicare Other

## 2024-08-20 ENCOUNTER — Encounter: Payer: Self-pay | Admitting: Pharmacist

## 2024-08-20 NOTE — Progress Notes (Signed)
 This patient is appearing on a report for being at risk of failing the adherence measure for hypertension (ACEi/ARB) medications this calendar year.   Medication: losartan benson Last fill date: 08/02/24 for 30 day supply  Appears adherent for the 2025 year - PASS

## 2024-08-21 LAB — CUP PACEART REMOTE DEVICE CHECK
Battery Remaining Longevity: 62 mo
Battery Remaining Percentage: 67 %
Battery Voltage: 2.99 V
Brady Statistic AP VP Percent: 23 %
Brady Statistic AP VS Percent: 1 %
Brady Statistic AS VP Percent: 77 %
Brady Statistic AS VS Percent: 1 %
Brady Statistic RA Percent Paced: 22 %
Date Time Interrogation Session: 20251204020011
Implantable Lead Connection Status: 753985
Implantable Lead Connection Status: 753985
Implantable Lead Connection Status: 753985
Implantable Lead Implant Date: 20131118
Implantable Lead Implant Date: 20131118
Implantable Lead Implant Date: 20230308
Implantable Lead Location: 753858
Implantable Lead Location: 753859
Implantable Lead Location: 753860
Implantable Pulse Generator Implant Date: 20230308
Lead Channel Impedance Value: 310 Ohm
Lead Channel Impedance Value: 400 Ohm
Lead Channel Impedance Value: 740 Ohm
Lead Channel Pacing Threshold Amplitude: 0.625 V
Lead Channel Pacing Threshold Amplitude: 0.625 V
Lead Channel Pacing Threshold Amplitude: 1.125 V
Lead Channel Pacing Threshold Pulse Width: 0.5 ms
Lead Channel Pacing Threshold Pulse Width: 0.5 ms
Lead Channel Pacing Threshold Pulse Width: 0.5 ms
Lead Channel Sensing Intrinsic Amplitude: 10.8 mV
Lead Channel Sensing Intrinsic Amplitude: 4.5 mV
Lead Channel Setting Pacing Amplitude: 1.625
Lead Channel Setting Pacing Amplitude: 2 V
Lead Channel Setting Pacing Amplitude: 2.125
Lead Channel Setting Pacing Pulse Width: 0.5 ms
Lead Channel Setting Pacing Pulse Width: 0.5 ms
Lead Channel Setting Sensing Sensitivity: 4 mV
Pulse Gen Model: 3562
Pulse Gen Serial Number: 8062726

## 2024-08-24 ENCOUNTER — Ambulatory Visit: Payer: Self-pay | Admitting: Cardiology

## 2024-08-25 NOTE — Progress Notes (Signed)
 Remote PPM Transmission

## 2024-09-02 ENCOUNTER — Other Ambulatory Visit: Payer: Self-pay | Admitting: Family Medicine

## 2024-09-02 DIAGNOSIS — N529 Male erectile dysfunction, unspecified: Secondary | ICD-10-CM

## 2024-09-27 ENCOUNTER — Encounter: Payer: Self-pay | Admitting: Pharmacist

## 2024-09-27 NOTE — Progress Notes (Signed)
 This patient is appearing on a report for being at risk of failing the adherence measure for hypertension (ACEi/ARB) medications this calendar year.   Medication: Losartan /HCT Last fill date: 08/30/24 for 30 day supply  Reviewed medication indication, dosing, and goals of therapy.

## 2024-10-10 ENCOUNTER — Other Ambulatory Visit: Payer: Self-pay | Admitting: Family Medicine

## 2024-10-10 DIAGNOSIS — I10 Essential (primary) hypertension: Secondary | ICD-10-CM

## 2024-10-11 ENCOUNTER — Other Ambulatory Visit: Payer: Self-pay | Admitting: Family Medicine

## 2024-10-11 DIAGNOSIS — N529 Male erectile dysfunction, unspecified: Secondary | ICD-10-CM

## 2024-10-11 DIAGNOSIS — N400 Enlarged prostate without lower urinary tract symptoms: Secondary | ICD-10-CM

## 2024-11-18 ENCOUNTER — Ambulatory Visit

## 2025-02-17 ENCOUNTER — Ambulatory Visit

## 2025-05-19 ENCOUNTER — Ambulatory Visit

## 2025-08-18 ENCOUNTER — Ambulatory Visit
